# Patient Record
Sex: Male | Born: 1950 | Race: Black or African American | Hispanic: No | Marital: Married | Smoking: Former smoker
Health system: Southern US, Community
[De-identification: ages and names within clinical notes are randomized; demographics above are authoritative.]

## PROBLEM LIST (undated history)

## (undated) DIAGNOSIS — E7800 Pure hypercholesterolemia, unspecified: Secondary | ICD-10-CM

## (undated) DIAGNOSIS — I639 Cerebral infarction, unspecified: Secondary | ICD-10-CM

## (undated) DIAGNOSIS — E291 Testicular hypofunction: Secondary | ICD-10-CM

## (undated) DIAGNOSIS — I10 Essential (primary) hypertension: Secondary | ICD-10-CM

## (undated) DIAGNOSIS — I2510 Atherosclerotic heart disease of native coronary artery without angina pectoris: Secondary | ICD-10-CM

## (undated) DIAGNOSIS — R0609 Other forms of dyspnea: Secondary | ICD-10-CM

## (undated) DIAGNOSIS — Z7982 Long term (current) use of aspirin: Secondary | ICD-10-CM

## (undated) DIAGNOSIS — G4733 Obstructive sleep apnea (adult) (pediatric): Secondary | ICD-10-CM

## (undated) DIAGNOSIS — I6502 Occlusion and stenosis of left vertebral artery: Secondary | ICD-10-CM

## (undated) DIAGNOSIS — I517 Cardiomegaly: Secondary | ICD-10-CM

## (undated) DIAGNOSIS — M1990 Unspecified osteoarthritis, unspecified site: Secondary | ICD-10-CM

## (undated) DIAGNOSIS — N183 Chronic kidney disease, stage 3 unspecified: Secondary | ICD-10-CM

## (undated) DIAGNOSIS — M109 Gout, unspecified: Secondary | ICD-10-CM

## (undated) DIAGNOSIS — K219 Gastro-esophageal reflux disease without esophagitis: Secondary | ICD-10-CM

## (undated) DIAGNOSIS — M5030 Other cervical disc degeneration, unspecified cervical region: Secondary | ICD-10-CM

## (undated) DIAGNOSIS — I5020 Unspecified systolic (congestive) heart failure: Secondary | ICD-10-CM

## (undated) DIAGNOSIS — I2089 Other forms of angina pectoris: Secondary | ICD-10-CM

## (undated) DIAGNOSIS — N400 Enlarged prostate without lower urinary tract symptoms: Secondary | ICD-10-CM

## (undated) DIAGNOSIS — I429 Cardiomyopathy, unspecified: Secondary | ICD-10-CM

## (undated) DIAGNOSIS — E119 Type 2 diabetes mellitus without complications: Secondary | ICD-10-CM

## (undated) DIAGNOSIS — H269 Unspecified cataract: Secondary | ICD-10-CM

## (undated) DIAGNOSIS — R011 Cardiac murmur, unspecified: Secondary | ICD-10-CM

## (undated) DIAGNOSIS — M19041 Primary osteoarthritis, right hand: Secondary | ICD-10-CM

## (undated) DIAGNOSIS — N189 Chronic kidney disease, unspecified: Secondary | ICD-10-CM

## (undated) DIAGNOSIS — K449 Diaphragmatic hernia without obstruction or gangrene: Secondary | ICD-10-CM

## (undated) DIAGNOSIS — N281 Cyst of kidney, acquired: Secondary | ICD-10-CM

## (undated) DIAGNOSIS — G4730 Sleep apnea, unspecified: Secondary | ICD-10-CM

## (undated) DIAGNOSIS — D509 Iron deficiency anemia, unspecified: Secondary | ICD-10-CM

## (undated) HISTORY — DX: Essential (primary) hypertension: I10

## (undated) HISTORY — DX: Pure hypercholesterolemia, unspecified: E78.00

## (undated) HISTORY — PX: OTHER SURGICAL HISTORY: SHX169

## (undated) HISTORY — DX: Gout, unspecified: M10.9

## (undated) HISTORY — PX: TONSILLECTOMY: SUR1361

## (undated) HISTORY — PX: CARDIAC CATHETERIZATION: SHX172

## (undated) HISTORY — DX: Type 2 diabetes mellitus without complications: E11.9

## (undated) HISTORY — PX: COLONOSCOPY: SHX174

---

## 2004-11-30 ENCOUNTER — Ambulatory Visit: Payer: Self-pay | Admitting: Family Medicine

## 2007-02-07 ENCOUNTER — Other Ambulatory Visit: Payer: Self-pay

## 2007-02-07 ENCOUNTER — Emergency Department: Payer: Self-pay | Admitting: Unknown Physician Specialty

## 2007-09-16 ENCOUNTER — Emergency Department: Payer: Self-pay | Admitting: Emergency Medicine

## 2009-07-21 ENCOUNTER — Ambulatory Visit: Payer: Self-pay | Admitting: Family Medicine

## 2011-08-30 HISTORY — PX: CATARACT EXTRACTION W/ INTRAOCULAR LENS IMPLANT: SHX1309

## 2011-08-30 HISTORY — PX: EYE SURGERY: SHX253

## 2011-10-18 ENCOUNTER — Ambulatory Visit: Payer: Self-pay | Admitting: Ophthalmology

## 2011-10-18 LAB — POTASSIUM: Potassium: 3 mmol/L — ABNORMAL LOW (ref 3.5–5.1)

## 2011-10-31 ENCOUNTER — Ambulatory Visit: Payer: Self-pay | Admitting: Ophthalmology

## 2011-10-31 LAB — POTASSIUM: Potassium: 3.6 mmol/L (ref 3.5–5.1)

## 2012-06-29 ENCOUNTER — Ambulatory Visit: Payer: Self-pay | Admitting: Unknown Physician Specialty

## 2012-07-03 LAB — PATHOLOGY REPORT

## 2012-07-24 ENCOUNTER — Ambulatory Visit: Payer: Self-pay | Admitting: Internal Medicine

## 2014-02-13 ENCOUNTER — Ambulatory Visit: Payer: Self-pay | Admitting: Podiatry

## 2014-02-27 ENCOUNTER — Encounter: Payer: Self-pay | Admitting: Podiatry

## 2014-12-21 NOTE — Op Note (Signed)
PATIENT NAME:  Eric Browning, Eric Browning MR#:  [id number] DATE OF BIRTH:  November 08, 1950  DATE OF PROCEDURE:  10/31/2011  PREOPERATIVE DIAGNOSIS: Cataract, right eye.   POSTOPERATIVE DIAGNOSIS: Cataract, left eye.   PROCEDURE PERFORMED: Extracapsular cataract extraction using phacoemulsification with placement of an Alcon SN6CWS, 18.5-diopter posterior chamber lens, serial [id number].   SURGEON: Loura Back. Fedor Kazmierski, M.D.   ASSISTANT: None.   ANESTHESIA: 4% lidocaine and 0.75% Marcaine in a 50-50 mixture with 10 units/mL of Hylenex added, given as a peribulbar.   ANESTHESIOLOGIST: Dr. Benjamine Mola   COMPLICATIONS: None.   ESTIMATED BLOOD LOSS: Less than 1 mL.   DESCRIPTION OF PROCEDURE:  The patient was brought to the operating room and given IV sedation and a peribulbar block. He was then prepped and draped in the usual fashion. The vertical rectus muscles were imbricated using 5-0 silk sutures as bridle sutures. A limbal peritomy was carried out for one clock hour at 12:00. Hemostasis was obtained with cautery and a partial-thickness scleral groove was made at the posterior surgical limbus. This was dissected anteriorly into clear cornea with an Alcon crescent knife. The anterior chamber was entered superotemporally and superonasally through clear cornea with a paracentesis knife. Air was used to replace the aqueous and Vision Blue was then used to paint the anterior capsule. DisCoVisc was injected through the paracentesis track to replace the air and the anterior chamber was entered through the lamellar dissection with a 2.6-mm keratome. A continuous tear circular capsulorrhexis was carried out. Hydrodelineation was used to loosen the nucleus and phacoemulsification was carried out in a divide and conquer technique. Ultrasound time was one minute and 38 seconds with an average power of 24.4%. CDE 40.46. Irrigation-aspiration was used to remove the residual cortex. The capsular bag was inflated with DisCoVisc and  the lens was inserted in the capsular bag using an Acrysert delivery system. Irrigation-aspiration was used to remove the residual DisCoVisc and the wound was inflated with balanced salt. Miostat was injected through the paracentesis track to induce miosis and deepen the chamber. The wound was checked for leaks. None were found. The conjunctiva was closed with cautery and the bridle sutures were removed. Two drops of Vigamox were placed on the eye. A shield was placed on the eye. The patient was discharged to the recovery room in good condition.   ____________________________ Loura Back Pixie Burgener, MD sad:bjt D: 10/31/2011 13:20:33 ET T: 10/31/2011 14:16:34 ET JOB#: [id number]  cc: Remo Lipps A. Fabyan Loughmiller, MD, <Dictator> Martie Lee MD ELECTRONICALLY SIGNED 11/01/2011 14:19

## 2014-12-21 NOTE — Op Note (Signed)
PATIENT NAME:  Eric Browning, Eric Browning MR#:  [id number] DATE OF BIRTH:  1951/03/22  DATE OF PROCEDURE:  10/31/2011  PREOPERATIVE DIAGNOSIS:  Cataract, right eye.   POSTOPERATIVE DIAGNOSIS:  Cataract, right eye.  PROCEDURE PERFORMED:  Extracapsular cataract extraction using phacoemulsification with placement of an Alcon SN6CWS, 18.5-diopter posterior chamber lens, serial # [id number].  SURGEON:  Loura Back. Marcel Sorter, MD  ASSISTANT:  None.  ANESTHESIA:  4% lidocaine and 0.75% Marcaine in a 50/50 mixture with 10 units/mL of Hylenex added, given as a peribulbar.   ANESTHESIOLOGIST:  Dr. Benjamine Mola.  COMPLICATIONS:  None.  ESTIMATED BLOOD LOSS:  Less than 1 ml.  DESCRIPTION OF PROCEDURE:  The patient was brought to the operating room and given a peribulbar block.  The patient was then prepped and draped in the usual fashion.  The vertical rectus muscles were imbricated using 5-0 silk sutures.  These sutures were then clamped to the sterile drapes as bridle sutures.  A limbal peritomy was performed extending two clock hours and hemostasis was obtained with cautery.  A partial thickness scleral groove was made at the surgical limbus and dissected anteriorly in a lamellar dissection using an Alcon crescent knife.  The anterior chamber was entered superonasally with a Superblade and through the lamellar dissection with a 2.6 mm keratome.  DisCoVisc was used to replace the aqueous and a continuous tear capsulorrhexis was carried out.  Hydrodissection and hydrodelineation were carried out with balanced salt and a 27 gauge canula.  The nucleus was rotated to confirm the effectiveness of the hydrodissection.  Phacoemulsification was carried out using a divide-and-conquer technique.  Total ultrasound time was 1 minute and 38 seconds with an average power of 24.4 percent and CDE of 40.46.  Irrigation/aspiration was used to remove the residual cortex.  DisCoVisc was used to inflate the capsule and the internal incision  was enlarged to 3 mm with the crescent knife.  The intraocular lens was folded and inserted into the capsular bag using the AcrySert delivery system. Irrigation/aspiration was used to remove the residual DisCoVisc.  Miostat was injected into the anterior chamber through the paracentesis track to inflate the anterior chamber and induce miosis.  The wound was checked for leaks and none were found. The conjunctiva was closed with cautery and the bridle sutures were removed.  Two drops of 0.3% Vigamox were placed on the eye.   An eye shield was placed on the eye.  The patient was discharged to the recovery room in good condition.  ____________________________ Loura Back Mekhi Sonn, MD sad:slb D: 10/31/2011 13:13:32 ET T: 10/31/2011 13:52:36 ET JOB#: [id number]  cc: Remo Lipps A. Emer Onnen, MD, <Dictator> Martie Lee MD ELECTRONICALLY SIGNED 11/01/2011 14:19

## 2015-02-05 ENCOUNTER — Other Ambulatory Visit: Payer: Self-pay | Admitting: Orthopedic Surgery

## 2015-02-05 DIAGNOSIS — M25511 Pain in right shoulder: Secondary | ICD-10-CM

## 2015-02-05 DIAGNOSIS — S46001A Unspecified injury of muscle(s) and tendon(s) of the rotator cuff of right shoulder, initial encounter: Secondary | ICD-10-CM

## 2015-02-13 ENCOUNTER — Ambulatory Visit
Admission: RE | Admit: 2015-02-13 | Discharge: 2015-02-13 | Disposition: A | Discharge status: Home / Self Care | Payer: Managed Care, Other (non HMO) | Source: Ambulatory Visit | Attending: Orthopedic Surgery | Admitting: Orthopedic Surgery

## 2015-02-13 DIAGNOSIS — M62521 Muscle wasting and atrophy, not elsewhere classified, right upper arm: Secondary | ICD-10-CM | POA: Insufficient documentation

## 2015-02-13 DIAGNOSIS — M19011 Primary osteoarthritis, right shoulder: Secondary | ICD-10-CM | POA: Insufficient documentation

## 2015-02-13 DIAGNOSIS — S46811A Strain of other muscles, fascia and tendons at shoulder and upper arm level, right arm, initial encounter: Secondary | ICD-10-CM | POA: Diagnosis not present

## 2015-02-13 DIAGNOSIS — M25511 Pain in right shoulder: Secondary | ICD-10-CM

## 2015-02-13 DIAGNOSIS — R531 Weakness: Secondary | ICD-10-CM | POA: Diagnosis present

## 2015-02-13 DIAGNOSIS — M25811 Other specified joint disorders, right shoulder: Secondary | ICD-10-CM | POA: Diagnosis not present

## 2015-02-13 DIAGNOSIS — S46001A Unspecified injury of muscle(s) and tendon(s) of the rotator cuff of right shoulder, initial encounter: Secondary | ICD-10-CM

## 2015-06-18 DIAGNOSIS — Z8739 Personal history of other diseases of the musculoskeletal system and connective tissue: Secondary | ICD-10-CM | POA: Insufficient documentation

## 2015-08-03 ENCOUNTER — Ambulatory Visit: Payer: Managed Care, Other (non HMO) | Admitting: Anesthesiology

## 2015-08-03 ENCOUNTER — Encounter
Admission: RE | Disposition: A | Discharge status: Home / Self Care | Payer: Self-pay | Source: Ambulatory Visit | Attending: Unknown Physician Specialty

## 2015-08-03 ENCOUNTER — Encounter: Payer: Self-pay | Admitting: *Deleted

## 2015-08-03 ENCOUNTER — Ambulatory Visit
Admission: RE | Admit: 2015-08-03 | Discharge: 2015-08-03 | Disposition: A | Discharge status: Home / Self Care | Payer: Managed Care, Other (non HMO) | Source: Ambulatory Visit | Attending: Unknown Physician Specialty | Admitting: Unknown Physician Specialty

## 2015-08-03 DIAGNOSIS — Z79899 Other long term (current) drug therapy: Secondary | ICD-10-CM | POA: Diagnosis not present

## 2015-08-03 DIAGNOSIS — D122 Benign neoplasm of ascending colon: Secondary | ICD-10-CM | POA: Insufficient documentation

## 2015-08-03 DIAGNOSIS — K640 First degree hemorrhoids: Secondary | ICD-10-CM | POA: Insufficient documentation

## 2015-08-03 DIAGNOSIS — Z7982 Long term (current) use of aspirin: Secondary | ICD-10-CM | POA: Diagnosis not present

## 2015-08-03 DIAGNOSIS — Z8249 Family history of ischemic heart disease and other diseases of the circulatory system: Secondary | ICD-10-CM | POA: Insufficient documentation

## 2015-08-03 DIAGNOSIS — Z7984 Long term (current) use of oral hypoglycemic drugs: Secondary | ICD-10-CM | POA: Diagnosis not present

## 2015-08-03 DIAGNOSIS — D125 Benign neoplasm of sigmoid colon: Secondary | ICD-10-CM | POA: Diagnosis not present

## 2015-08-03 DIAGNOSIS — K635 Polyp of colon: Secondary | ICD-10-CM | POA: Insufficient documentation

## 2015-08-03 DIAGNOSIS — I10 Essential (primary) hypertension: Secondary | ICD-10-CM | POA: Diagnosis not present

## 2015-08-03 DIAGNOSIS — E7800 Pure hypercholesterolemia, unspecified: Secondary | ICD-10-CM | POA: Diagnosis not present

## 2015-08-03 DIAGNOSIS — E119 Type 2 diabetes mellitus without complications: Secondary | ICD-10-CM | POA: Diagnosis not present

## 2015-08-03 DIAGNOSIS — Z1211 Encounter for screening for malignant neoplasm of colon: Secondary | ICD-10-CM | POA: Diagnosis present

## 2015-08-03 DIAGNOSIS — Z87891 Personal history of nicotine dependence: Secondary | ICD-10-CM | POA: Insufficient documentation

## 2015-08-03 DIAGNOSIS — Z86010 Personal history of colonic polyps: Secondary | ICD-10-CM | POA: Insufficient documentation

## 2015-08-03 DIAGNOSIS — D123 Benign neoplasm of transverse colon: Secondary | ICD-10-CM | POA: Insufficient documentation

## 2015-08-03 HISTORY — PX: COLONOSCOPY WITH PROPOFOL: SHX5780

## 2015-08-03 LAB — GLUCOSE, CAPILLARY: GLUCOSE-CAPILLARY: 108 mg/dL — AB (ref 65–99)

## 2015-08-03 SURGERY — COLONOSCOPY WITH PROPOFOL
Anesthesia: General

## 2015-08-03 MED ORDER — SODIUM CHLORIDE 0.9 % IV SOLN: INTRAVENOUS | Status: DC

## 2015-08-03 MED ORDER — FENTANYL CITRATE (PF) 100 MCG/2ML IJ SOLN
INTRAMUSCULAR | Status: DC | PRN
  Administered 2015-08-03: 50 ug via INTRAVENOUS

## 2015-08-03 MED ORDER — LIDOCAINE HCL (PF) 2 % IJ SOLN
INTRAMUSCULAR | Status: DC | PRN
  Administered 2015-08-03: 50 mg

## 2015-08-03 MED ORDER — PROPOFOL 10 MG/ML IV BOLUS
INTRAVENOUS | Status: DC | PRN
  Administered 2015-08-03: 30 mg via INTRAVENOUS
  Administered 2015-08-03 (×2): 10 mg via INTRAVENOUS

## 2015-08-03 MED ORDER — MIDAZOLAM HCL 5 MG/5ML IJ SOLN
INTRAMUSCULAR | Status: DC | PRN
  Administered 2015-08-03: 1 mg via INTRAVENOUS

## 2015-08-03 MED ORDER — PROPOFOL 500 MG/50ML IV EMUL
INTRAVENOUS | Status: DC | PRN
  Administered 2015-08-03: 140 ug/kg/min via INTRAVENOUS

## 2015-08-03 MED ORDER — SODIUM CHLORIDE 0.9 % IV SOLN
INTRAVENOUS | Status: DC
  Administered 2015-08-03: 1000 mL via INTRAVENOUS

## 2015-08-03 NOTE — H&P (Signed)
   Primary Care Physician:  Dion Body, MD Primary Gastroenterologist:  Dr. Vira Agar  Pre-Procedure History & Physical: HPI:  Eric Browning is a 64 y.o. male is here for an colonoscopy.   Past Medical History  Diagnosis Date  . Hypertension   . High cholesterol   . Diabetes ([place])   . Gout     Past Surgical History  Procedure Laterality Date  . Colonoscopy    . Tonsillectomy      Prior to Admission medications   Medication Sig Start Date End Date Taking? Authorizing Provider  allopurinol (ZYLOPRIM) 300 MG tablet Take 300 mg by mouth daily.   Yes Historical Provider, MD  aspirin 81 MG tablet Take 81 mg by mouth daily.   Yes Historical Provider, MD  atorvastatin (LIPITOR) 20 MG tablet Take 20 mg by mouth daily.   Yes Historical Provider, MD  colchicine 0.6 MG tablet Take 0.6 mg by mouth daily.   Yes Historical Provider, MD  etodolac (LODINE) 200 MG capsule Take 200 mg by mouth every 8 (eight) hours.   Yes Historical Provider, MD  metoprolol succinate (TOPROL-XL) 25 MG 24 hr tablet Take 25 mg by mouth daily.   Yes Historical Provider, MD  Olmesartan-Amlodipine-HCTZ (TRIBENZOR) 40-10-25 MG TABS Take by mouth.   Yes Historical Provider, MD  potassium chloride SA (K-DUR,KLOR-CON) 20 MEQ tablet Take 20 mEq by mouth 2 (two) times daily.   Yes Historical Provider, MD  metFORMIN (GLUCOPHAGE) 500 MG tablet Take by mouth.    Historical Provider, MD  pravastatin (PRAVACHOL) 20 MG tablet Take 20 mg by mouth daily.    Historical Provider, MD    Allergies as of 06/25/2015  . (No Known Allergies)    Family History  Problem Relation Age of Onset  . Heart disease Mother   . Stroke Father   . Hypertension Father   . Gout Father     Social History   Social History  . Marital Status: Married    Spouse Name: N/A  . Number of Children: N/A  . Years of Education: N/A   Occupational History  . Not on file.   Social History Main Topics  . Smoking status: Former Research scientist (life sciences)  .  Smokeless tobacco: Not on file     Comment: quit 1990  . Alcohol Use: No  . Drug Use: No  . Sexual Activity: Not on file   Other Topics Concern  . Not on file   Social History Narrative    Review of Systems: See HPI, otherwise negative ROS  Physical Exam: BP 178/73 mmHg  Pulse 79  Temp(Src) 98.3 F (36.8 C) (Oral)  Resp 18  Ht 5\' 7"  (1.702 m)  Wt 99.791 kg (220 lb)  BMI 34.45 kg/m2  SpO2 100% General:   Alert,  pleasant and cooperative in NAD Head:  Normocephalic and atraumatic. Neck:  Supple; no masses or thyromegaly. Lungs:  Clear throughout to auscultation.    Heart:  Regular rate and rhythm. Abdomen:  Soft, nontender and nondistended. Normal bowel sounds, without guarding, and without rebound.   Neurologic:  Alert and  oriented x4;  grossly normal neurologically.  Impression/Plan: Eric Browning is here for an colonoscopy to be performed for [facility] colon polyps  Risks, benefits, limitations, and alternatives regarding  colonoscopy have been reviewed with the patient.  Questions have been answered.  All parties agreeable.   Gaylyn Cheers, MD  08/03/2015, 9:02 AM

## 2015-08-03 NOTE — Anesthesia Preprocedure Evaluation (Signed)
Anesthesia Evaluation  Patient identified by MRN, date of birth, ID band Patient awake    Reviewed: Allergy & Precautions, H&P , NPO status , Patient's Chart, lab work & pertinent test results, reviewed documented beta blocker date and time   History of Anesthesia Complications Negative for: history of anesthetic complications  Airway Mallampati: III  TM Distance: >3 FB Neck ROM: full    Dental no notable dental hx. (+) Edentulous Upper, Edentulous Lower, Upper Dentures, Lower Dentures   Pulmonary neg shortness of breath, neg sleep apnea, neg COPD, neg recent URI, former smoker,    Pulmonary exam normal breath sounds clear to auscultation       Cardiovascular Exercise Tolerance: Good hypertension, (-) angina(-) CAD, (-) Past MI, (-) Cardiac Stents and (-) CABG Normal cardiovascular exam(-) dysrhythmias + Valvular Problems/Murmurs  Rhythm:regular Rate:Normal     Neuro/Psych negative neurological ROS  negative psych ROS   GI/Hepatic negative GI ROS, Neg liver ROS,   Endo/Other  diabetes, Well Controlled, Oral Hypoglycemic Agents  Renal/GU negative Renal ROS  negative genitourinary   Musculoskeletal   Abdominal   Peds  Hematology negative hematology ROS (+)   Anesthesia Other Findings Past Medical History:   Hypertension                                                 High cholesterol                                             Diabetes (HCC)                                               Gout                                                         Reproductive/Obstetrics negative OB ROS                             Anesthesia Physical Anesthesia Plan  ASA: III  Anesthesia Plan: General   Post-op Pain Management:    Induction:   Airway Management Planned:   Additional Equipment:   Intra-op Plan:   Post-operative Plan:   Informed Consent: I have reviewed the patients History and  Physical, chart, labs and discussed the procedure including the risks, benefits and alternatives for the proposed anesthesia with the patient or authorized representative who has indicated his/her understanding and acceptance.   Dental Advisory Given  Plan Discussed with: Anesthesiologist, CRNA and Surgeon  Anesthesia Plan Comments:         Anesthesia Quick Evaluation

## 2015-08-03 NOTE — Transfer of Care (Signed)
Immediate Anesthesia Transfer of Care Note  Patient: Eric Browning  Procedure(s) Performed: Procedure(s): COLONOSCOPY WITH PROPOFOL (N/A)  Patient Location: PACU  Anesthesia Type:General  Level of Consciousness: sedated  Airway & Oxygen Therapy: Patient Spontanous Breathing and Patient connected to nasal cannula oxygen  Post-op Assessment: Report given to RN and Post -op Vital signs reviewed and stable  Post vital signs: Reviewed and stable  Last Vitals:  Filed Vitals:   08/03/15 0844  BP: 178/73  Pulse: 79  Temp: 36.8 C  Resp: 18    Complications: No apparent anesthesia complications

## 2015-08-03 NOTE — Op Note (Signed)
[facility] Patient Name: Eric Browning Procedure Date: 08/03/2015 8:49 AM MRN: [id number] Account #: [id number] Date of Birth: 22-Sep-1950 Admit Type: Outpatient Age: 64 Room: [facility] ENDO ROOM 1 Gender: Male Note Status: Finalized Procedure:         Colonoscopy Indications:       High risk colon cancer surveillance: Personal history of                     colonic polyps Providers:         Manya Silvas, MD Referring MD:      Dion Body (Referring MD) Medicines:         Propofol per Anesthesia Complications:     No immediate complications. Procedure:         Pre-Anesthesia Assessment:                    - After reviewing the risks and benefits, the patient was                     deemed in satisfactory condition to undergo the procedure.                    After obtaining informed consent, the colonoscope was                     passed under direct vision. Throughout the procedure, the                     patient's blood pressure, pulse, and oxygen saturations                     were monitored continuously. The Olympus PCF-H180AL                     colonoscope ( S#: [id number] ) was introduced through the                     anus and advanced to the the cecum, identified by                     appendiceal orifice and ileocecal valve. The colonoscopy                     was performed without difficulty. The patient tolerated                     the procedure well. The quality of the bowel preparation                     was good. Findings:      A small polyp was found in the ascending colon. The polyp was sessile.       The polyp was removed with a hot snare. Resection and retrieval were       complete. To prevent bleeding after the polypectomy, two hemostatic       clips were successfully placed. There was no bleeding during, and at the       end, of the procedure.      A diminutive polyp was found in the transverse colon. The polyp was      sessile. The polyp was removed with a jumbo cold forceps. Resection and       retrieval were complete.      A diminutive polyp was found  in the sigmoid colon. The polyp was       sessile. The polyp was removed with a jumbo cold forceps. Resection and       retrieval were complete.      A submucosal non-obstructing medium-sized mass was found in the rectum.       The mass was non-circumferential. The mass measured tw0-three cm in       length. No bleeding was present. Biopsies were taken with a cold forceps       for histology.      Internal hemorrhoids were found during endoscopy. The hemorrhoids were       small and Grade I (internal hemorrhoids that do not prolapse). Impression:        - One small polyp in the ascending colon. Resected and                     retrieved. Clips were placed.                    - One diminutive polyp in the transverse colon. Resected                     and retrieved.                    - One diminutive polyp in the sigmoid colon. Resected and                     retrieved.                    - Likely benign tumor in the rectum. Removal was not done.                     Biopsied.                    - Internal hemorrhoids. Recommendation:    - Await pathology results. Manya Silvas, MD 08/03/2015 9:46:39 AM This report has been signed electronically. Number of Addenda: 0 Note Initiated On: 08/03/2015 8:49 AM Total Procedure Duration: 0 hours 25 minutes 30 seconds       [name]

## 2015-08-03 NOTE — Anesthesia Postprocedure Evaluation (Signed)
Anesthesia Post Note  Patient: Eric Browning  Procedure(s) Performed: Procedure(s) (LRB): COLONOSCOPY WITH PROPOFOL (N/A)  Patient location during evaluation: PACU Anesthesia Type: General Level of consciousness: awake and alert Pain management: pain level controlled Vital Signs Assessment: post-procedure vital signs reviewed and stable Respiratory status: spontaneous breathing, nonlabored ventilation, respiratory function stable and patient connected to nasal cannula oxygen Cardiovascular status: blood pressure returned to baseline and stable Postop Assessment: no signs of nausea or vomiting Anesthetic complications: no    Last Vitals:  Filed Vitals:   08/03/15 1028 08/03/15 1038  BP: 160/84 170/88  Pulse: 63 64  Temp:    Resp: 15 15    Last Pain:  Filed Vitals:   08/03/15 1038  PainSc: Asleep                 Martha Clan

## 2015-08-05 LAB — SURGICAL PATHOLOGY

## 2015-08-10 ENCOUNTER — Telehealth: Payer: Self-pay

## 2015-08-10 NOTE — Telephone Encounter (Signed)
  Oncology Nurse Navigator Documentation  Referral date to RadOnc/MedOnc: 08/07/15 (08/10/15 1600) Navigator Encounter Type: Telephone (08/10/15 1600)         Interventions: Coordination of Care (08/10/15 1600)   Coordination of Care: EUS (08/10/15 1600)        Time Spent with Patient: 30 (08/10/15 1600)   Referral received for lower EUS. Available date of 08/13/15 offered to patient. He stated that his spouse was having surgery on this Tuesday and he would not have a ride. He asked if he could schedule it in 2 weeks when [facility] physicians are back. Next available date is 09/03/15. This date has been arranged. Went over instructions for lower EUS as well as mailed him a copy. Caryl Pina at Dr [facility] office notified of procedure delay due to patient request.Miralax-Gatorade prep sent to patient. INSTRUCTIONS FOR ENDOSCOPIC ULTRASOUND -Your procedure has been scheduled for January 5th with Dr Earlie Counts -The hospital will contact you to pre-register over the phone. If for any reason you have not received a call within one week prior to your scheduled procedure date, please call [phone]. -To get your scheduled arrival time, please call the Endoscopy unit at  [phone] between 1-3pm on: January 4th -Begin your bowel prep at 12:00PM on January 4th . Instructions for Miralax-Gatorade Bowel Preparation are enclosed. -ON THE DAY OF YOU PROCEDURE:  1. You may take your heart, seizure, blood pressure, Parkinson's or breathing medications at  6am with just enough water to get your pills down  2. Do not take any oral Diabetic medications the morning of your procedure.  3. Do not take Vitamins  -On the day of your procedure, come to the [facility] Admitting/Registration desk (First desk on the right) at the scheduled arrival time. -You will need a responsible person to drive you home.

## 2015-08-11 NOTE — Telephone Encounter (Signed)
EUS will be with Dr Mont Dutton

## 2015-08-30 HISTORY — PX: JOINT REPLACEMENT: SHX530

## 2015-09-03 ENCOUNTER — Ambulatory Visit: Payer: Managed Care, Other (non HMO) | Admitting: Anesthesiology

## 2015-09-03 ENCOUNTER — Ambulatory Visit
Admission: RE | Admit: 2015-09-03 | Discharge: 2015-09-03 | Disposition: A | Discharge status: Home / Self Care | Payer: Managed Care, Other (non HMO) | Source: Ambulatory Visit | Attending: Internal Medicine | Admitting: Internal Medicine

## 2015-09-03 ENCOUNTER — Encounter
Admission: RE | Disposition: A | Discharge status: Home / Self Care | Payer: Self-pay | Source: Ambulatory Visit | Attending: Internal Medicine

## 2015-09-03 ENCOUNTER — Encounter: Payer: Self-pay | Admitting: *Deleted

## 2015-09-03 DIAGNOSIS — M109 Gout, unspecified: Secondary | ICD-10-CM | POA: Diagnosis not present

## 2015-09-03 DIAGNOSIS — Z7984 Long term (current) use of oral hypoglycemic drugs: Secondary | ICD-10-CM | POA: Diagnosis not present

## 2015-09-03 DIAGNOSIS — Z8249 Family history of ischemic heart disease and other diseases of the circulatory system: Secondary | ICD-10-CM | POA: Diagnosis not present

## 2015-09-03 DIAGNOSIS — K639 Disease of intestine, unspecified: Secondary | ICD-10-CM | POA: Diagnosis not present

## 2015-09-03 DIAGNOSIS — Z8489 Family history of other specified conditions: Secondary | ICD-10-CM | POA: Diagnosis not present

## 2015-09-03 DIAGNOSIS — Z87891 Personal history of nicotine dependence: Secondary | ICD-10-CM | POA: Diagnosis not present

## 2015-09-03 DIAGNOSIS — E119 Type 2 diabetes mellitus without complications: Secondary | ICD-10-CM | POA: Insufficient documentation

## 2015-09-03 DIAGNOSIS — E7800 Pure hypercholesterolemia, unspecified: Secondary | ICD-10-CM | POA: Diagnosis not present

## 2015-09-03 DIAGNOSIS — K598 Other specified functional intestinal disorders: Secondary | ICD-10-CM | POA: Diagnosis present

## 2015-09-03 DIAGNOSIS — Z7982 Long term (current) use of aspirin: Secondary | ICD-10-CM | POA: Diagnosis not present

## 2015-09-03 DIAGNOSIS — D1779 Benign lipomatous neoplasm of other sites: Secondary | ICD-10-CM | POA: Insufficient documentation

## 2015-09-03 DIAGNOSIS — I10 Essential (primary) hypertension: Secondary | ICD-10-CM | POA: Insufficient documentation

## 2015-09-03 DIAGNOSIS — Z823 Family history of stroke: Secondary | ICD-10-CM | POA: Insufficient documentation

## 2015-09-03 HISTORY — PX: EUS: SHX5427

## 2015-09-03 LAB — GLUCOSE, CAPILLARY: Glucose-Capillary: 78 mg/dL (ref 65–99)

## 2015-09-03 SURGERY — ULTRASOUND, LOWER GI TRACT, ENDOSCOPIC
Anesthesia: General

## 2015-09-03 MED ORDER — LIDOCAINE HCL (PF) 1 % IJ SOLN
2.0000 mL | Freq: Once | INTRAMUSCULAR | Status: AC
  Administered 2015-09-03: 0.03 mL via INTRADERMAL

## 2015-09-03 MED ORDER — SODIUM CHLORIDE 0.9 % IV SOLN
INTRAVENOUS | Status: DC
  Administered 2015-09-03: 1000 mL via INTRAVENOUS
  Administered 2015-09-03: 13:00:00 via INTRAVENOUS

## 2015-09-03 MED ORDER — PROPOFOL 500 MG/50ML IV EMUL
INTRAVENOUS | Status: DC | PRN
  Administered 2015-09-03: 150 ug/kg/min via INTRAVENOUS

## 2015-09-03 MED ORDER — LIDOCAINE HCL (PF) 1 % IJ SOLN
INTRAMUSCULAR | Status: AC
  Administered 2015-09-03: 0.03 mL via INTRADERMAL
  Filled 2015-09-03: qty 2

## 2015-09-03 MED ORDER — PROPOFOL 10 MG/ML IV BOLUS
INTRAVENOUS | Status: DC | PRN
  Administered 2015-09-03: 70 mg via INTRAVENOUS

## 2015-09-03 MED ORDER — MIDAZOLAM HCL 2 MG/2ML IJ SOLN
INTRAMUSCULAR | Status: DC | PRN
  Administered 2015-09-03: 1 mg via INTRAVENOUS

## 2015-09-03 MED ORDER — FENTANYL CITRATE (PF) 100 MCG/2ML IJ SOLN
INTRAMUSCULAR | Status: DC | PRN
  Administered 2015-09-03: 50 ug via INTRAVENOUS

## 2015-09-03 NOTE — H&P (Signed)
Kennth Demory is an 65 y.o. male.   Chief Complaint: Rectal mass for rectal EUS evaluation HPI:  Incidentally found rectal submucosal lesion on recent routine colonoscopy.  Past Medical History  Diagnosis Date  . Hypertension   . High cholesterol   . Diabetes ([place])   . Gout     Past Surgical History  Procedure Laterality Date  . Colonoscopy    . Tonsillectomy    . Colonoscopy with propofol N/A 08/03/2015    Procedure: COLONOSCOPY WITH PROPOFOL;  Surgeon: Manya Silvas, MD;  Location: [facility] ENDOSCOPY;  Service: Endoscopy;  Laterality: N/A;  . Right eye lens implant      Family History  Problem Relation Age of Onset  . Heart disease Mother   . Stroke Father   . Hypertension Father   . Gout Father    Social History:  reports that he has quit smoking. He does not have any smokeless tobacco history on file. He reports that he does not drink alcohol or use illicit drugs.  Allergies: No Known Allergies  Medications Prior to Admission  Medication Sig Dispense Refill  . allopurinol (ZYLOPRIM) 300 MG tablet Take 300 mg by mouth daily.    Marland Kitchen aspirin 81 MG tablet Take 81 mg by mouth daily.    Marland Kitchen atorvastatin (LIPITOR) 20 MG tablet Take 20 mg by mouth daily.    . colchicine 0.6 MG tablet Take 0.6 mg by mouth daily.    Marland Kitchen etodolac (LODINE) 200 MG capsule Take 200 mg by mouth every 8 (eight) hours.    . metFORMIN (GLUCOPHAGE) 500 MG tablet Take by mouth.    . metoprolol succinate (TOPROL-XL) 25 MG 24 hr tablet Take 25 mg by mouth daily.    . Olmesartan-Amlodipine-HCTZ (TRIBENZOR) 40-10-25 MG TABS Take by mouth.    . potassium chloride SA (K-DUR,KLOR-CON) 20 MEQ tablet Take 20 mEq by mouth 2 (two) times daily.    . pravastatin (PRAVACHOL) 20 MG tablet Take 20 mg by mouth daily.      Results for orders placed or performed during the hospital encounter of 09/03/15 (from the past 48 hour(s))  Glucose, capillary     Status: None   Collection Time: 09/03/15 12:36 PM  Result Value Ref Range   Glucose-Capillary 78 65 - 99 mg/dL   No results found.  ROS  Blood pressure 184/69, pulse 78, temperature 97.9 F (36.6 C), temperature source Tympanic, resp. rate 18, height 5\' 6"  (1.676 m), weight 98.884 kg (218 lb). Physical Exam  Abd: Soft, NT, ND, + BS Chest: CTAB Heart: RRR, no murmurs  Assessment/Plan Rectal EUS  Tillie Rung 09/03/2015, 1:07 PM

## 2015-09-03 NOTE — Op Note (Signed)
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 Providers:         Murray Hodgkins. Corrie Reder Referring MD:      Dion Body (Referring MD), Manya Silvas, MD                     (Referring MD) Medicines:         Propofol per Anesthesia Complications:     No immediate complications. Procedure:         Pre-Anesthesia Assessment:                    Prior to the procedure, a History and Physical was                     performed, and patient medications and allergies were                     reviewed. The patient is competent. The risks and benefits                     of the procedure and the sedation options and risks were                     discussed with the patient. All questions were answered                     and informed consent was obtained. Patient identification                     and proposed procedure were verified by the physician, the                     nurse and the anesthetist in the pre-procedure area.                     Mental Status Examination: alert and oriented. Airway                     Examination: normal oropharyngeal airway and neck                     mobility. Respiratory Examination: clear to auscultation.                     CV Examination: normal. Prophylactic Antibiotics: The                     patient does not require prophylactic antibiotics. Prior                     Anticoagulants: The patient has taken no previous  anticoagulant or antiplatelet  agents. ASA Grade                     Assessment: II - A patient with mild systemic disease.                     After reviewing the risks and benefits, the patient was                     deemed in satisfactory condition to undergo the procedure.                     The anesthesia plan was to use monitored anesthesia care                     (MAC). Immediately prior to administration of medications,                     the patient was re-assessed for adequacy to receive                     sedatives. The heart rate, respiratory rate, oxygen                     saturations, blood pressure, adequacy of pulmonary                     ventilation, and response to care were monitored                     throughout the procedure. The physical status of the                     patient was re-assessed after the procedure.                    After obtaining informed consent, the endoscope was passed                     under direct vision. Throughout the procedure, the                     patient's blood pressure, pulse, and oxygen saturations                     were monitored continuously. The Endoscope was introduced                     through the anus and advanced to the sigmoid colon. The                     EUS GI Radial Array [id number] was introduced through the                     anus and advanced to the sigmoid colon for ultrasound. The                     lower EUS was accomplished without difficulty. The patient                     tolerated the procedure well. The quality of the bowel                     preparation was good. Findings:      The perianal and digital rectal examinations  were normal.      Endoscopic Finding :      One medium sized submucosal nodule was found in the distal rectum.      The proximal rectum, recto-sigmoid colon and sigmoid colon appeared       normal.      Endosonographic Finding :      An oval intramural (subepithelial) lesion was found in the distal        rectum. The lesion was encountered at 3 cm (from the anal verge). The       lesion was hyperechoic. Sonographically, the origin appeared to be       within the submucosa (Layer 3) and measured 12.9 mm X 8.8 mm in greatest       cross-sectional dimensions. The endosonographic borders were       well-defined.      The perirectal space was normal.      No lymph nodes were seen in the perirectal region. Impression:        Flexible Sigmoidoscopy Impressions:                    - Submucosal nodule in the distal rectum.                    - The proximal rectum, recto-sigmoid colon and sigmoid                     colon are normal.                    Rectal EUS Impressions:                    - An intramural (subepithelial) lesion was visualized                     endosonographically in the rectum. The origin of the                     lesion appeared to be within the submucosa (Layer 3).                     Tissue has not been obtained. 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                    - Endosonographic images of the perirectal space were                     unremarkable.                    - No lymph nodes were seen in the perirectal region during                     endosonographic examination.                    - No specimens collected. Recommendation:    - Discharge patient to home (ambulatory).                    - No further work-up is necessary for the rectal nodule                     given the appearance  consistent with a lipoma. Resume                     routine colon cancer screening at previously recommended                     interval.                    - The findings and recommendations were discussed with the                     patient and his family.                    - Return to referring physician as previously scheduled. Procedure Code(s): --- Professional ---                    [phone], Sigmoidoscopy,  flexible; with endoscopic ultrasound                     examination Diagnosis Code(s): --- Professional ---                    K62.89, Other specified diseases of anus and rectum CPT copyright 2014 American Medical Association. All rights reserved. The codes documented in this report are preliminary and upon coder review may  be revised to meet current compliance requirements. Attending Participation:      I personally performed the entire procedure without the assistance of a       fellow, resident or surgical assistant. Kincaid,  09/03/2015 1:32:24 PM This report has been signed electronically. Number of Addenda: 0 Note Initiated On: 09/03/2015 12:44 PM Total Procedure Duration: 0 hours 8 minutes 30 seconds       [facility]

## 2015-09-03 NOTE — Anesthesia Preprocedure Evaluation (Signed)
Anesthesia Evaluation  Patient identified by MRN, date of birth, ID band Patient awake    Reviewed: Allergy & Precautions, H&P , NPO status , Patient's Chart, lab work & pertinent test results, reviewed documented beta blocker date and time   History of Anesthesia Complications Negative for: history of anesthetic complications  Airway Mallampati: III  TM Distance: >3 FB Neck ROM: full    Dental no notable dental hx. (+) Upper Dentures, Lower Dentures, Edentulous Upper, Edentulous Lower   Pulmonary neg shortness of breath, neg sleep apnea, neg COPD, neg recent URI, former smoker,    Pulmonary exam normal breath sounds clear to auscultation       Cardiovascular Exercise Tolerance: Good hypertension, (-) angina(-) CAD, (-) Past MI, (-) Cardiac Stents and (-) CABG Normal cardiovascular exam(-) dysrhythmias + Valvular Problems/Murmurs  Rhythm:regular Rate:Normal     Neuro/Psych negative neurological ROS  negative psych ROS   GI/Hepatic negative GI ROS, Neg liver ROS,   Endo/Other  diabetes, Well Controlled, Oral Hypoglycemic Agents  Renal/GU negative Renal ROS  negative genitourinary   Musculoskeletal   Abdominal   Peds  Hematology negative hematology ROS (+)   Anesthesia Other Findings Past Medical History:   Hypertension                                                 High cholesterol                                             Diabetes (HCC)                                               Gout                                                         Reproductive/Obstetrics negative OB ROS                             Anesthesia Physical Anesthesia Plan  ASA: III  Anesthesia Plan: General   Post-op Pain Management:    Induction:   Airway Management Planned:   Additional Equipment:   Intra-op Plan:   Post-operative Plan:   Informed Consent: I have reviewed the patients History and  Physical, chart, labs and discussed the procedure including the risks, benefits and alternatives for the proposed anesthesia with the patient or authorized representative who has indicated his/her understanding and acceptance.   Dental Advisory Given  Plan Discussed with: Anesthesiologist, CRNA and Surgeon  Anesthesia Plan Comments:         Anesthesia Quick Evaluation

## 2015-09-03 NOTE — Transfer of Care (Signed)
Immediate Anesthesia Transfer of Care Note  Patient: Eric Browning  Procedure(s) Performed: Procedure(s): LOWER ENDOSCOPIC ULTRASOUND (EUS) (N/A)  Patient Location: PACU  Anesthesia Type:General  Level of Consciousness: sedated  Airway & Oxygen Therapy: Patient Spontanous Breathing and Patient connected to nasal cannula oxygen  Post-op Assessment: Report given to RN and Post -op Vital signs reviewed and stable  Post vital signs: Reviewed and stable  Last Vitals:  Filed Vitals:   09/03/15 1239  BP: 184/69  Pulse: 78  Temp: 36.6 C  Resp: 18    Complications: No apparent anesthesia complications

## 2015-09-04 NOTE — Anesthesia Postprocedure Evaluation (Signed)
Anesthesia Post Note  Patient: Eric Browning  Procedure(s) Performed: Procedure(s) (LRB): LOWER ENDOSCOPIC ULTRASOUND (EUS) (N/A)  Patient location during evaluation: Endoscopy Anesthesia Type: General Level of consciousness: awake and alert Pain management: pain level controlled Vital Signs Assessment: post-procedure vital signs reviewed and stable Respiratory status: spontaneous breathing, nonlabored ventilation, respiratory function stable and patient connected to nasal cannula oxygen Cardiovascular status: blood pressure returned to baseline and stable Postop Assessment: no signs of nausea or vomiting Anesthetic complications: no    Last Vitals:  Filed Vitals:   09/03/15 1350 09/03/15 1400  BP: 135/77 152/106  Pulse: 73 70  Temp:    Resp: 15 16    Last Pain:  Filed Vitals:   09/04/15 0815  PainSc: 0-No pain                 Martha Clan

## 2016-03-29 ENCOUNTER — Other Ambulatory Visit: Payer: Self-pay | Admitting: Surgery

## 2016-03-29 DIAGNOSIS — M1711 Unilateral primary osteoarthritis, right knee: Secondary | ICD-10-CM

## 2016-03-29 DIAGNOSIS — S83271A Complex tear of lateral meniscus, current injury, right knee, initial encounter: Secondary | ICD-10-CM

## 2016-04-14 ENCOUNTER — Ambulatory Visit
Admission: RE | Admit: 2016-04-14 | Discharge: 2016-04-14 | Disposition: A | Discharge status: Home / Self Care | Payer: Managed Care, Other (non HMO) | Source: Ambulatory Visit | Attending: Surgery | Admitting: Surgery

## 2016-04-14 DIAGNOSIS — M659 Synovitis and tenosynovitis, unspecified: Secondary | ICD-10-CM | POA: Insufficient documentation

## 2016-04-14 DIAGNOSIS — S83421A Sprain of lateral collateral ligament of right knee, initial encounter: Secondary | ICD-10-CM | POA: Diagnosis not present

## 2016-04-14 DIAGNOSIS — S83511A Sprain of anterior cruciate ligament of right knee, initial encounter: Secondary | ICD-10-CM | POA: Diagnosis not present

## 2016-04-14 DIAGNOSIS — X58XXXA Exposure to other specified factors, initial encounter: Secondary | ICD-10-CM | POA: Insufficient documentation

## 2016-04-14 DIAGNOSIS — S83271A Complex tear of lateral meniscus, current injury, right knee, initial encounter: Secondary | ICD-10-CM | POA: Diagnosis present

## 2016-04-14 DIAGNOSIS — M7121 Synovial cyst of popliteal space [Baker], right knee: Secondary | ICD-10-CM | POA: Diagnosis not present

## 2016-04-14 DIAGNOSIS — M25461 Effusion, right knee: Secondary | ICD-10-CM | POA: Diagnosis not present

## 2016-04-14 DIAGNOSIS — M1711 Unilateral primary osteoarthritis, right knee: Secondary | ICD-10-CM | POA: Insufficient documentation

## 2016-05-04 ENCOUNTER — Encounter
Admission: RE | Admit: 2016-05-04 | Discharge: 2016-05-04 | Disposition: A | Discharge status: Home / Self Care | Payer: Managed Care, Other (non HMO) | Source: Ambulatory Visit | Attending: Surgery | Admitting: Surgery

## 2016-05-04 ENCOUNTER — Ambulatory Visit
Admission: RE | Admit: 2016-05-04 | Discharge: 2016-05-04 | Disposition: A | Discharge status: Home / Self Care | Payer: Managed Care, Other (non HMO) | Source: Ambulatory Visit | Attending: Surgery | Admitting: Surgery

## 2016-05-04 DIAGNOSIS — Z01810 Encounter for preprocedural cardiovascular examination: Secondary | ICD-10-CM | POA: Insufficient documentation

## 2016-05-04 DIAGNOSIS — S83271D Complex tear of lateral meniscus, current injury, right knee, subsequent encounter: Secondary | ICD-10-CM | POA: Diagnosis not present

## 2016-05-04 DIAGNOSIS — M1711 Unilateral primary osteoarthritis, right knee: Secondary | ICD-10-CM | POA: Diagnosis not present

## 2016-05-04 DIAGNOSIS — Z87891 Personal history of nicotine dependence: Secondary | ICD-10-CM

## 2016-05-04 DIAGNOSIS — Z01812 Encounter for preprocedural laboratory examination: Secondary | ICD-10-CM | POA: Diagnosis present

## 2016-05-04 DIAGNOSIS — I517 Cardiomegaly: Secondary | ICD-10-CM | POA: Diagnosis not present

## 2016-05-04 DIAGNOSIS — X58XXXD Exposure to other specified factors, subsequent encounter: Secondary | ICD-10-CM | POA: Insufficient documentation

## 2016-05-04 LAB — URINALYSIS COMPLETE WITH MICROSCOPIC (ARMC ONLY)
BILIRUBIN URINE: NEGATIVE
Bacteria, UA: NONE SEEN
Glucose, UA: NEGATIVE mg/dL
Hgb urine dipstick: NEGATIVE
Ketones, ur: NEGATIVE mg/dL
Leukocytes, UA: NEGATIVE
Nitrite: NEGATIVE
PH: 5 (ref 5.0–8.0)
PROTEIN: 30 mg/dL — AB
RBC / HPF: NONE SEEN RBC/hpf (ref 0–5)
Specific Gravity, Urine: 1.019 (ref 1.005–1.030)

## 2016-05-04 LAB — BASIC METABOLIC PANEL
Anion gap: 5 (ref 5–15)
BUN: 24 mg/dL — ABNORMAL HIGH (ref 6–20)
CALCIUM: 10.4 mg/dL — AB (ref 8.9–10.3)
CO2: 32 mmol/L (ref 22–32)
Chloride: 103 mmol/L (ref 101–111)
Creatinine, Ser: 0.95 mg/dL (ref 0.61–1.24)
Glucose, Bld: 86 mg/dL (ref 65–99)
Potassium: 3.1 mmol/L — ABNORMAL LOW (ref 3.5–5.1)
Sodium: 140 mmol/L (ref 135–145)

## 2016-05-04 LAB — CBC
HCT: 36.5 % — ABNORMAL LOW (ref 40.0–52.0)
Hemoglobin: 12.2 g/dL — ABNORMAL LOW (ref 13.0–18.0)
MCH: 28.8 pg (ref 26.0–34.0)
MCHC: 33.3 g/dL (ref 32.0–36.0)
MCV: 86.5 fL (ref 80.0–100.0)
PLATELETS: 380 10*3/uL (ref 150–440)
RBC: 4.23 MIL/uL — ABNORMAL LOW (ref 4.40–5.90)
RDW: 14.9 % — AB (ref 11.5–14.5)
WBC: 8.2 10*3/uL (ref 3.8–10.6)

## 2016-05-04 LAB — PROTIME-INR
INR: 1.01
Prothrombin Time: 13.3 seconds (ref 11.4–15.2)

## 2016-05-04 LAB — SURGICAL PCR SCREEN
MRSA, PCR: NEGATIVE
STAPHYLOCOCCUS AUREUS: NEGATIVE

## 2016-05-04 LAB — TYPE AND SCREEN
ABO/RH(D): B POS
ANTIBODY SCREEN: NEGATIVE

## 2016-05-04 NOTE — Patient Instructions (Signed)
Your procedure is scheduled on: Thursday 05/12/16 Report to Day Surgery. 2ND FLOOR MEDICAL MALL ENTRANCE To find out your arrival time please call [phone] between 1PM - 3PM on Wednesday 05/11/16.  Remember: Instructions that are not followed completely may result in serious medical risk, up to and including death, or upon the discretion of your surgeon and anesthesiologist your surgery may need to be rescheduled.    __X__ 1. Do not eat food or drink liquids after midnight. No gum chewing or hard candies.     __X__ 2. No Alcohol for 24 hours before or after surgery.   ____ 3. Bring all medications with you on the day of surgery if instructed.    __X__ 4. Notify your doctor if there is any change in your medical condition     (cold, fever, infections).     Do not wear jewelry, make-up, hairpins, clips or nail polish.  Do not wear lotions, powders, or perfumes.   Do not shave 48 hours prior to surgery. Men may shave face and neck.  Do not bring valuables to the hospital.    [facility] is not responsible for any belongings or valuables.               Contacts, dentures or bridgework may not be worn into surgery.  Leave your suitcase in the car. After surgery it may be brought to your room.  For patients admitted to the hospital, discharge time is determined by your                treatment team.   Patients discharged the day of surgery will not be allowed to drive home.   Please read over the following fact sheets that you were given:   MRSA Information and Surgical Site Infection Prevention   __X__ Take these medicines the morning of surgery with A SIP OF WATER:    1. ATORVASTATIN  2. METOPROLOL  3. PRAVASTATIN  4.  5.  6.  ____ Fleet Enema (as directed)   __X__ Use CHG Soap as directed  ____ Use inhalers on the day of surgery  __X__ Stop metformin 2 days prior to surgery    ____ Take 1/2 of usual insulin dose the night before surgery and none on the morning of  surgery.   __X__ Stop Coumadin/Plavix/aspirin on 05/05/16  __X__ Stop Anti-inflammatories on 05/05/16 ETODOLAC/LODINE   ____ Stop supplements until after surgery.    ____ Bring C-Pap to the hospital.

## 2016-05-04 NOTE — Pre-Procedure Instructions (Signed)
Abnormal EKG, request for clearance faxed to Dr Netty Starring and Dr Roland Rack

## 2016-05-05 NOTE — Pre-Procedure Instructions (Signed)
Faxed a request to Dr. Nicholaus Bloom office for adjustment in Alva dose due to K 3.1 in PAT.  Will recheck AM of surgery.

## 2016-05-12 ENCOUNTER — Inpatient Hospital Stay: Payer: Managed Care, Other (non HMO)

## 2016-05-12 ENCOUNTER — Encounter: Payer: Self-pay | Admitting: *Deleted

## 2016-05-12 ENCOUNTER — Inpatient Hospital Stay: Payer: Managed Care, Other (non HMO) | Admitting: Anesthesiology

## 2016-05-12 ENCOUNTER — Encounter
Admission: RE | Disposition: A | Discharge status: Home Health | Payer: Self-pay | Source: Ambulatory Visit | Attending: Surgery

## 2016-05-12 ENCOUNTER — Inpatient Hospital Stay
Admission: RE | Admit: 2016-05-12 | Discharge: 2016-05-14 | DRG: 470 | Disposition: A | Discharge status: Home Health | Payer: Managed Care, Other (non HMO) | Source: Ambulatory Visit | Attending: Surgery | Admitting: Surgery

## 2016-05-12 DIAGNOSIS — E669 Obesity, unspecified: Secondary | ICD-10-CM | POA: Diagnosis present

## 2016-05-12 DIAGNOSIS — M1711 Unilateral primary osteoarthritis, right knee: Principal | ICD-10-CM | POA: Diagnosis present

## 2016-05-12 DIAGNOSIS — E7800 Pure hypercholesterolemia, unspecified: Secondary | ICD-10-CM | POA: Diagnosis present

## 2016-05-12 DIAGNOSIS — M23200 Derangement of unspecified lateral meniscus due to old tear or injury, right knee: Secondary | ICD-10-CM | POA: Diagnosis present

## 2016-05-12 DIAGNOSIS — I10 Essential (primary) hypertension: Secondary | ICD-10-CM | POA: Diagnosis present

## 2016-05-12 DIAGNOSIS — M109 Gout, unspecified: Secondary | ICD-10-CM | POA: Diagnosis present

## 2016-05-12 DIAGNOSIS — Z7982 Long term (current) use of aspirin: Secondary | ICD-10-CM | POA: Diagnosis not present

## 2016-05-12 DIAGNOSIS — Z96651 Presence of right artificial knee joint: Secondary | ICD-10-CM

## 2016-05-12 DIAGNOSIS — Z6833 Body mass index (BMI) 33.0-33.9, adult: Secondary | ICD-10-CM

## 2016-05-12 DIAGNOSIS — Z7984 Long term (current) use of oral hypoglycemic drugs: Secondary | ICD-10-CM | POA: Diagnosis not present

## 2016-05-12 DIAGNOSIS — Z79899 Other long term (current) drug therapy: Secondary | ICD-10-CM | POA: Diagnosis not present

## 2016-05-12 DIAGNOSIS — E119 Type 2 diabetes mellitus without complications: Secondary | ICD-10-CM | POA: Diagnosis present

## 2016-05-12 HISTORY — PX: TOTAL KNEE ARTHROPLASTY: SHX125

## 2016-05-12 LAB — POCT I-STAT 4, (NA,K, GLUC, HGB,HCT)
Glucose, Bld: 121 mg/dL — ABNORMAL HIGH (ref 65–99)
HEMATOCRIT: 34 % — AB (ref 39.0–52.0)
HEMOGLOBIN: 11.6 g/dL — AB (ref 13.0–17.0)
Potassium: 3.5 mmol/L (ref 3.5–5.1)
Sodium: 143 mmol/L (ref 135–145)

## 2016-05-12 LAB — GLUCOSE, CAPILLARY
GLUCOSE-CAPILLARY: 155 mg/dL — AB (ref 65–99)
Glucose-Capillary: 108 mg/dL — ABNORMAL HIGH (ref 65–99)
Glucose-Capillary: 98 mg/dL (ref 65–99)

## 2016-05-12 SURGERY — ARTHROPLASTY, KNEE, TOTAL
Anesthesia: Spinal | Site: Knee | Laterality: Right | Wound class: Clean

## 2016-05-12 MED ORDER — PROMETHAZINE HCL 25 MG/ML IJ SOLN: 6.2500 mg | INTRAMUSCULAR | Status: DC | PRN

## 2016-05-12 MED ORDER — ACETAMINOPHEN 325 MG PO TABS: 650.0000 mg | ORAL_TABLET | Freq: Four times a day (QID) | ORAL | Status: DC | PRN

## 2016-05-12 MED ORDER — ACETAMINOPHEN 650 MG RE SUPP: 650.0000 mg | Freq: Four times a day (QID) | RECTAL | Status: DC | PRN

## 2016-05-12 MED ORDER — ASPIRIN 81 MG PO CHEW
81.0000 mg | CHEWABLE_TABLET | Freq: Every day | ORAL | Status: DC
Start: 2016-05-12 — End: 2016-05-14
  Administered 2016-05-12 – 2016-05-14 (×3): 81 mg via ORAL
  Filled 2016-05-12 (×3): qty 1

## 2016-05-12 MED ORDER — NEOMYCIN-POLYMYXIN B GU 40-200000 IR SOLN
Status: AC
  Filled 2016-05-12: qty 20

## 2016-05-12 MED ORDER — SODIUM CHLORIDE 0.9 % IV SOLN
INTRAVENOUS | Status: DC | PRN
  Administered 2016-05-12: 60 mL

## 2016-05-12 MED ORDER — MAGNESIUM HYDROXIDE 400 MG/5ML PO SUSP
30.0000 mL | Freq: Every day | ORAL | Status: DC | PRN
  Administered 2016-05-13: 30 mL via ORAL
  Filled 2016-05-12: qty 30

## 2016-05-12 MED ORDER — TRANEXAMIC ACID 1000 MG/10ML IV SOLN
INTRAVENOUS | Status: AC
  Filled 2016-05-12: qty 10

## 2016-05-12 MED ORDER — METFORMIN HCL 500 MG PO TABS
500.0000 mg | ORAL_TABLET | Freq: Every day | ORAL | Status: DC
  Administered 2016-05-13 – 2016-05-14 (×2): 500 mg via ORAL
  Filled 2016-05-12 (×2): qty 1

## 2016-05-12 MED ORDER — POTASSIUM CHLORIDE CRYS ER 20 MEQ PO TBCR
20.0000 meq | EXTENDED_RELEASE_TABLET | Freq: Two times a day (BID) | ORAL | Status: DC
  Administered 2016-05-12 – 2016-05-14 (×5): 20 meq via ORAL
  Filled 2016-05-12 (×5): qty 1

## 2016-05-12 MED ORDER — BISACODYL 10 MG RE SUPP: 10.0000 mg | Freq: Every day | RECTAL | Status: DC | PRN

## 2016-05-12 MED ORDER — CEFAZOLIN SODIUM-DEXTROSE 2-4 GM/100ML-% IV SOLN
2.0000 g | Freq: Once | INTRAVENOUS | Status: AC
  Administered 2016-05-12: 2 g via INTRAVENOUS

## 2016-05-12 MED ORDER — ACETAMINOPHEN 500 MG PO TABS
1000.0000 mg | ORAL_TABLET | Freq: Four times a day (QID) | ORAL | Status: AC
  Administered 2016-05-12 – 2016-05-13 (×4): 1000 mg via ORAL
  Filled 2016-05-12 (×4): qty 2

## 2016-05-12 MED ORDER — ATORVASTATIN CALCIUM 20 MG PO TABS
20.0000 mg | ORAL_TABLET | Freq: Every day | ORAL | Status: DC
  Administered 2016-05-12 – 2016-05-14 (×3): 20 mg via ORAL
  Filled 2016-05-12 (×3): qty 1

## 2016-05-12 MED ORDER — ALLOPURINOL 300 MG PO TABS
300.0000 mg | ORAL_TABLET | Freq: Every day | ORAL | Status: DC
  Administered 2016-05-12 – 2016-05-14 (×3): 300 mg via ORAL
  Filled 2016-05-12 (×3): qty 1

## 2016-05-12 MED ORDER — DOCUSATE SODIUM 100 MG PO CAPS
100.0000 mg | ORAL_CAPSULE | Freq: Two times a day (BID) | ORAL | Status: DC
  Administered 2016-05-12 – 2016-05-14 (×5): 100 mg via ORAL
  Filled 2016-05-12 (×5): qty 1

## 2016-05-12 MED ORDER — METOPROLOL SUCCINATE ER 25 MG PO TB24
25.0000 mg | ORAL_TABLET | Freq: Every day | ORAL | Status: DC
  Administered 2016-05-13 – 2016-05-14 (×2): 25 mg via ORAL
  Filled 2016-05-12 (×2): qty 1

## 2016-05-12 MED ORDER — GLYCOPYRROLATE 0.2 MG/ML IJ SOLN
INTRAMUSCULAR | Status: DC | PRN
  Administered 2016-05-12: 0.2 mg via INTRAVENOUS

## 2016-05-12 MED ORDER — SODIUM CHLORIDE 0.9 % IV SOLN
INTRAVENOUS | Status: DC | PRN
  Administered 2016-05-12: 4 ug/kg/min via INTRAVENOUS

## 2016-05-12 MED ORDER — AMLODIPINE BESYLATE 10 MG PO TABS
10.0000 mg | ORAL_TABLET | Freq: Every day | ORAL | Status: DC
  Administered 2016-05-13 – 2016-05-14 (×2): 10 mg via ORAL
  Filled 2016-05-12 (×2): qty 1

## 2016-05-12 MED ORDER — PROPOFOL 500 MG/50ML IV EMUL
INTRAVENOUS | Status: DC | PRN
  Administered 2016-05-12: 40 ug/kg/min via INTRAVENOUS

## 2016-05-12 MED ORDER — OLMESARTAN-AMLODIPINE-HCTZ 40-10-25 MG PO TABS
1.0000 | ORAL_TABLET | Freq: Every day | ORAL | Status: DC
Start: 2016-05-13 — End: 2016-05-12

## 2016-05-12 MED ORDER — POTASSIUM CHLORIDE IN NACL 20-0.9 MEQ/L-% IV SOLN
INTRAVENOUS | Status: DC
  Administered 2016-05-12 – 2016-05-13 (×3): via INTRAVENOUS
  Filled 2016-05-12 (×8): qty 1000

## 2016-05-12 MED ORDER — HYDROMORPHONE HCL 1 MG/ML IJ SOLN
1.0000 mg | INTRAMUSCULAR | Status: DC | PRN
  Administered 2016-05-12 (×2): 1 mg via INTRAVENOUS
  Administered 2016-05-12: 2 mg via INTRAVENOUS
  Administered 2016-05-12 – 2016-05-13 (×4): 1 mg via INTRAVENOUS
  Administered 2016-05-13 (×2): 2 mg via INTRAVENOUS
  Filled 2016-05-12: qty 1
  Filled 2016-05-12: qty 2
  Filled 2016-05-12: qty 1
  Filled 2016-05-12: qty 2
  Filled 2016-05-12 (×3): qty 1
  Filled 2016-05-12: qty 2
  Filled 2016-05-12: qty 1

## 2016-05-12 MED ORDER — FENTANYL CITRATE (PF) 100 MCG/2ML IJ SOLN: 25.0000 ug | INTRAMUSCULAR | Status: DC | PRN

## 2016-05-12 MED ORDER — DIPHENHYDRAMINE HCL 12.5 MG/5ML PO ELIX: 12.5000 mg | ORAL_SOLUTION | ORAL | Status: DC | PRN

## 2016-05-12 MED ORDER — FENTANYL CITRATE (PF) 100 MCG/2ML IJ SOLN
INTRAMUSCULAR | Status: DC | PRN
  Administered 2016-05-12: 100 ug via INTRAVENOUS

## 2016-05-12 MED ORDER — HYDROCHLOROTHIAZIDE 25 MG PO TABS
25.0000 mg | ORAL_TABLET | Freq: Every day | ORAL | Status: DC
  Administered 2016-05-13 – 2016-05-14 (×2): 25 mg via ORAL
  Filled 2016-05-12 (×2): qty 1

## 2016-05-12 MED ORDER — MEPERIDINE HCL 25 MG/ML IJ SOLN: 6.2500 mg | INTRAMUSCULAR | Status: DC | PRN

## 2016-05-12 MED ORDER — ONDANSETRON HCL 4 MG/2ML IJ SOLN
4.0000 mg | Freq: Four times a day (QID) | INTRAMUSCULAR | Status: DC | PRN
  Administered 2016-05-13: 4 mg via INTRAVENOUS
  Filled 2016-05-12: qty 2

## 2016-05-12 MED ORDER — SODIUM CHLORIDE 0.9 % IV SOLN
INTRAVENOUS | Status: DC | PRN
  Administered 2016-05-12: 25 ug/min via INTRAVENOUS

## 2016-05-12 MED ORDER — METOCLOPRAMIDE HCL 5 MG/ML IJ SOLN
5.0000 mg | Freq: Three times a day (TID) | INTRAMUSCULAR | Status: DC | PRN
Start: 2016-05-12 — End: 2016-05-14

## 2016-05-12 MED ORDER — PANTOPRAZOLE SODIUM 40 MG PO TBEC
40.0000 mg | DELAYED_RELEASE_TABLET | Freq: Two times a day (BID) | ORAL | Status: DC
  Administered 2016-05-12 – 2016-05-14 (×5): 40 mg via ORAL
  Filled 2016-05-12 (×5): qty 1

## 2016-05-12 MED ORDER — CEFAZOLIN SODIUM-DEXTROSE 2-4 GM/100ML-% IV SOLN
2.0000 g | Freq: Four times a day (QID) | INTRAVENOUS | Status: AC
  Administered 2016-05-12 – 2016-05-13 (×3): 2 g via INTRAVENOUS
  Filled 2016-05-12 (×3): qty 100

## 2016-05-12 MED ORDER — SODIUM CHLORIDE 0.9 % IV SOLN
INTRAVENOUS | Status: DC
Start: 2016-05-12 — End: 2016-05-12
  Administered 2016-05-12: 08:00:00 via INTRAVENOUS

## 2016-05-12 MED ORDER — SODIUM CHLORIDE 0.9 % IJ SOLN
INTRAMUSCULAR | Status: AC
  Filled 2016-05-12: qty 50

## 2016-05-12 MED ORDER — FLEET ENEMA 7-19 GM/118ML RE ENEM: 1.0000 | ENEMA | Freq: Once | RECTAL | Status: DC | PRN

## 2016-05-12 MED ORDER — FAMOTIDINE 20 MG PO TABS
20.0000 mg | ORAL_TABLET | Freq: Once | ORAL | Status: AC
  Administered 2016-05-12: 20 mg via ORAL

## 2016-05-12 MED ORDER — IRBESARTAN 150 MG PO TABS
300.0000 mg | ORAL_TABLET | Freq: Every day | ORAL | Status: DC
  Administered 2016-05-12 – 2016-05-14 (×3): 300 mg via ORAL
  Filled 2016-05-12 (×3): qty 2

## 2016-05-12 MED ORDER — METOCLOPRAMIDE HCL 10 MG PO TABS: 5.0000 mg | ORAL_TABLET | Freq: Three times a day (TID) | ORAL | Status: DC | PRN

## 2016-05-12 MED ORDER — OXYCODONE HCL 5 MG PO TABS: 5.0000 mg | ORAL_TABLET | Freq: Once | ORAL | Status: DC | PRN

## 2016-05-12 MED ORDER — FAMOTIDINE 20 MG PO TABS
ORAL_TABLET | ORAL | Status: AC
  Filled 2016-05-12: qty 1

## 2016-05-12 MED ORDER — ACETAMINOPHEN 10 MG/ML IV SOLN
INTRAVENOUS | Status: DC | PRN
  Administered 2016-05-12: 1000 mg via INTRAVENOUS

## 2016-05-12 MED ORDER — PROPOFOL 10 MG/ML IV BOLUS
INTRAVENOUS | Status: DC | PRN
  Administered 2016-05-12: 18 mg via INTRAVENOUS

## 2016-05-12 MED ORDER — MIDAZOLAM HCL 5 MG/5ML IJ SOLN
INTRAMUSCULAR | Status: DC | PRN
  Administered 2016-05-12 (×2): 1 mg via INTRAVENOUS

## 2016-05-12 MED ORDER — ENOXAPARIN SODIUM 40 MG/0.4ML ~~LOC~~ SOLN
40.0000 mg | SUBCUTANEOUS | Status: DC
  Administered 2016-05-13 – 2016-05-14 (×2): 40 mg via SUBCUTANEOUS
  Filled 2016-05-12 (×2): qty 0.4

## 2016-05-12 MED ORDER — OXYCODONE HCL 5 MG/5ML PO SOLN: 5.0000 mg | Freq: Once | ORAL | Status: DC | PRN

## 2016-05-12 MED ORDER — KETOROLAC TROMETHAMINE 15 MG/ML IJ SOLN
15.0000 mg | Freq: Four times a day (QID) | INTRAMUSCULAR | Status: AC
  Administered 2016-05-12 – 2016-05-13 (×5): 15 mg via INTRAVENOUS
  Filled 2016-05-12 (×5): qty 1

## 2016-05-12 MED ORDER — CEFAZOLIN SODIUM-DEXTROSE 2-4 GM/100ML-% IV SOLN
INTRAVENOUS | Status: AC
  Filled 2016-05-12: qty 100

## 2016-05-12 MED ORDER — NEOMYCIN-POLYMYXIN B GU 40-200000 IR SOLN
Status: DC | PRN
  Administered 2016-05-12: 16 mL

## 2016-05-12 MED ORDER — BUPIVACAINE-EPINEPHRINE (PF) 0.5% -1:200000 IJ SOLN
INTRAMUSCULAR | Status: AC
  Filled 2016-05-12: qty 30

## 2016-05-12 MED ORDER — BUPIVACAINE HCL (PF) 0.5 % IJ SOLN
INTRAMUSCULAR | Status: DC | PRN
  Administered 2016-05-12: 3 mL

## 2016-05-12 MED ORDER — BUPIVACAINE-EPINEPHRINE (PF) 0.5% -1:200000 IJ SOLN
INTRAMUSCULAR | Status: DC | PRN
  Administered 2016-05-12: 30 mL via PERINEURAL

## 2016-05-12 MED ORDER — PRAVASTATIN SODIUM 20 MG PO TABS
20.0000 mg | ORAL_TABLET | Freq: Every day | ORAL | Status: DC
  Administered 2016-05-13 – 2016-05-14 (×2): 20 mg via ORAL
  Filled 2016-05-12 (×2): qty 1

## 2016-05-12 MED ORDER — KETAMINE HCL 50 MG/ML IJ SOLN
INTRAMUSCULAR | Status: DC | PRN
  Administered 2016-05-12: 5 mg via INTRAMUSCULAR
  Administered 2016-05-12: 1.8 mg via INTRAMUSCULAR

## 2016-05-12 MED ORDER — ONDANSETRON HCL 4 MG PO TABS: 4.0000 mg | ORAL_TABLET | Freq: Four times a day (QID) | ORAL | Status: DC | PRN

## 2016-05-12 MED ORDER — KETOROLAC TROMETHAMINE 30 MG/ML IJ SOLN
INTRAMUSCULAR | Status: AC
  Administered 2016-05-12: 30 mg via INTRAVENOUS
  Filled 2016-05-12: qty 1

## 2016-05-12 MED ORDER — KETOROLAC TROMETHAMINE 15 MG/ML IJ SOLN
30.0000 mg | Freq: Once | INTRAMUSCULAR | Status: DC
  Filled 2016-05-12: qty 2

## 2016-05-12 MED ORDER — ACETAMINOPHEN 10 MG/ML IV SOLN
INTRAVENOUS | Status: AC
  Filled 2016-05-12: qty 100

## 2016-05-12 MED ORDER — BUPIVACAINE LIPOSOME 1.3 % IJ SUSP
INTRAMUSCULAR | Status: AC
  Filled 2016-05-12: qty 20

## 2016-05-12 MED ORDER — COLCHICINE 0.6 MG PO TABS
0.6000 mg | ORAL_TABLET | Freq: Every day | ORAL | Status: DC
  Administered 2016-05-12 – 2016-05-14 (×3): 0.6 mg via ORAL
  Filled 2016-05-12 (×3): qty 1

## 2016-05-12 MED ORDER — OXYCODONE HCL 5 MG PO TABS
5.0000 mg | ORAL_TABLET | ORAL | Status: DC | PRN
  Administered 2016-05-12: 5 mg via ORAL
  Administered 2016-05-13 – 2016-05-14 (×6): 10 mg via ORAL
  Filled 2016-05-12: qty 2
  Filled 2016-05-12: qty 1
  Filled 2016-05-12 (×5): qty 2

## 2016-05-12 MED ORDER — TRANEXAMIC ACID 1000 MG/10ML IV SOLN
INTRAVENOUS | Status: DC | PRN
  Administered 2016-05-12: 1000 mg via INTRAVENOUS

## 2016-05-12 SURGICAL SUPPLY — 56 items
BLADE SAW SAG 25X90X1.19 (BLADE) ×3 IMPLANT
BLADE SURG SZ20 CARB STEEL (BLADE) ×3 IMPLANT
BNDG COHESIVE 6X5 TAN STRL LF (GAUZE/BANDAGES/DRESSINGS) ×3 IMPLANT
BONE CEMENT PALACOSE (Orthopedic Implant) ×6 IMPLANT
BOWL CEMENT MIX W/ADAPTER (MISCELLANEOUS) IMPLANT
CANISTER SUCT 1200ML W/VALVE (MISCELLANEOUS) ×3 IMPLANT
CANISTER SUCT 3000ML (MISCELLANEOUS) ×3 IMPLANT
CAPT KNEE TOTAL 3 ×3 IMPLANT
CATH TRAY METER 16FR LF (MISCELLANEOUS) ×3 IMPLANT
CEMENT BONE PALACOSE (Orthopedic Implant) ×2 IMPLANT
CHLORAPREP W/TINT 26ML (MISCELLANEOUS) ×3 IMPLANT
COMPACT MIXING SYSTEM ×3 IMPLANT
COOLER POLAR GLACIER W/PUMP (MISCELLANEOUS) ×3 IMPLANT
COVER MAYO STAND STRL (DRAPES) ×3 IMPLANT
CUFF TOURN 24 STER (MISCELLANEOUS) ×3 IMPLANT
CUFF TOURN 30 STER DUAL PORT (MISCELLANEOUS) IMPLANT
DRAPE IMP U-DRAPE 54X76 (DRAPES) ×3 IMPLANT
DRAPE INCISE IOBAN 66X45 STRL (DRAPES) ×6 IMPLANT
DRSG OPSITE POSTOP 4X12 (GAUZE/BANDAGES/DRESSINGS) ×3 IMPLANT
DRSG OPSITE POSTOP 4X14 (GAUZE/BANDAGES/DRESSINGS) IMPLANT
ELECT CAUTERY BLADE 6.4 (BLADE) ×3 IMPLANT
ELECT REM PT RETURN 9FT ADLT (ELECTROSURGICAL) ×3
ELECTRODE REM PT RTRN 9FT ADLT (ELECTROSURGICAL) ×1 IMPLANT
GLOVE BIO SURGEON STRL SZ7.5 (GLOVE) ×6 IMPLANT
GLOVE BIO SURGEON STRL SZ8 (GLOVE) ×6 IMPLANT
GLOVE BIOGEL PI IND STRL 8 (GLOVE) ×1 IMPLANT
GLOVE BIOGEL PI INDICATOR 8 (GLOVE) ×2
GLOVE INDICATOR 8.0 STRL GRN (GLOVE) ×3 IMPLANT
GOWN STRL REUS W/ TWL LRG LVL3 (GOWN DISPOSABLE) ×2 IMPLANT
GOWN STRL REUS W/ TWL XL LVL3 (GOWN DISPOSABLE) ×1 IMPLANT
GOWN STRL REUS W/TWL LRG LVL3 (GOWN DISPOSABLE) ×4
GOWN STRL REUS W/TWL XL LVL3 (GOWN DISPOSABLE) ×2
HANDPIECE INTERPULSE COAX TIP (DISPOSABLE) ×2
HOOD PEEL AWAY FLYTE STAYCOOL (MISCELLANEOUS) ×12 IMPLANT
IMMBOLIZER KNEE 19 BLUE UNIV (SOFTGOODS) ×3 IMPLANT
KIT RM TURNOVER STRD PROC AR (KITS) ×3 IMPLANT
NDL SAFETY 18GX1.5 (NEEDLE) ×3 IMPLANT
NEEDLE 18GX1X1/2 (RX/OR ONLY) (NEEDLE) ×3 IMPLANT
NEEDLE SPNL 20GX3.5 QUINCKE YW (NEEDLE) ×3 IMPLANT
NS IRRIG 1000ML POUR BTL (IV SOLUTION) ×3 IMPLANT
PACK TOTAL KNEE (MISCELLANEOUS) ×3 IMPLANT
PAD WRAPON POLAR KNEE (MISCELLANEOUS) ×1 IMPLANT
SET HNDPC FAN SPRY TIP SCT (DISPOSABLE) ×1 IMPLANT
SOL .9 NS 3000ML IRR  AL (IV SOLUTION) ×2
SOL .9 NS 3000ML IRR UROMATIC (IV SOLUTION) ×1 IMPLANT
STAPLER SKIN PROX 35W (STAPLE) ×3 IMPLANT
SUCTION FRAZIER HANDLE 10FR (MISCELLANEOUS) ×2
SUCTION TUBE FRAZIER 10FR DISP (MISCELLANEOUS) ×1 IMPLANT
SUT VIC AB 0 CT1 36 (SUTURE) ×9 IMPLANT
SUT VIC AB 2-0 CT1 27 (SUTURE) ×6
SUT VIC AB 2-0 CT1 TAPERPNT 27 (SUTURE) ×3 IMPLANT
SYR 20CC LL (SYRINGE) ×3 IMPLANT
SYR 30ML LL (SYRINGE) ×3 IMPLANT
SYRINGE 10CC LL (SYRINGE) ×3 IMPLANT
SYSTEM VACUUM CEMENT MIXING ×3 IMPLANT
WRAPON POLAR PAD KNEE (MISCELLANEOUS) ×3

## 2016-05-12 NOTE — Anesthesia Preprocedure Evaluation (Signed)
Anesthesia Evaluation   Patient awake    Reviewed: Allergy & Precautions, NPO status , Patient's Chart, lab work & pertinent test results, reviewed documented beta blocker date and time   History of Anesthesia Complications Negative for: history of anesthetic complications  Airway Mallampati: II  TM Distance: >3 FB Neck ROM: Full    Dental  (+) Lower Dentures, Upper Dentures   Pulmonary neg sleep apnea, neg COPD, former smoker,    breath sounds clear to auscultation- rhonchi (-) wheezing      Cardiovascular Exercise Tolerance: Good hypertension, Pt. on medications and Pt. on home beta blockers (-) CAD and (-) Past MI  Rhythm:Regular Rate:Normal - Systolic murmurs and - Diastolic murmurs    Neuro/Psych negative neurological ROS  negative psych ROS   GI/Hepatic negative GI ROS, Neg liver ROS,   Endo/Other  diabetes, Type 2, Oral Hypoglycemic Agents  Renal/GU negative Renal ROS     Musculoskeletal   Abdominal (+) + obese,   Peds  Hematology negative hematology ROS (+)   Anesthesia Other Findings Past Medical History: No date: Diabetes ([place]) No date: Gout No date: High cholesterol No date: Hypertension   Reproductive/Obstetrics                             Anesthesia Physical Anesthesia Plan  ASA: III  Anesthesia Plan: Spinal   Post-op Pain Management:    Induction:   Airway Management Planned: Natural Airway  Additional Equipment:   Intra-op Plan:   Post-operative Plan:   Informed Consent: I have reviewed the patients History and Physical, chart, labs and discussed the procedure including the risks, benefits and alternatives for the proposed anesthesia with the patient or authorized representative who has indicated his/her understanding and acceptance.   Dental advisory given  Plan Discussed with: CRNA and Anesthesiologist  Anesthesia Plan Comments:         Lab  Results  Component Value Date   WBC 8.2 05/04/2016   HGB 11.6 (L) 05/12/2016   HCT 34.0 (L) 05/12/2016   MCV 86.5 05/04/2016   PLT 380 05/04/2016    Anesthesia Quick Evaluation

## 2016-05-12 NOTE — Anesthesia Procedure Notes (Signed)
Spinal  Patient location during procedure: OR Start time: 05/12/2016 9:25 AM End time: 05/12/2016 9:35 AM Staffing Anesthesiologist: Emmie Niemann Performed: anesthesiologist  Preanesthetic Checklist Completed: patient identified, site marked, surgical consent, pre-op evaluation, timeout performed, IV checked, risks and benefits discussed and monitors and equipment checked Spinal Block Patient position: sitting Prep: ChloraPrep Patient monitoring: heart rate, continuous pulse ox, blood pressure and cardiac monitor Approach: midline Location: L4-5 Injection technique: single-shot Needle Needle type: Whitacre and Introducer  Needle gauge: 24 G Needle length: 9 cm Additional Notes Negative paresthesia. Negative blood return. Positive free-flowing CSF. Expiration date of kit checked and confirmed. Patient tolerated procedure well, without complications.

## 2016-05-12 NOTE — Transfer of Care (Signed)
Immediate Anesthesia Transfer of Care Note  Patient: Eric Browning  Procedure(s) Performed: Procedure(s): TOTAL KNEE ARTHROPLASTY (Right)  Patient Location: PACU  Anesthesia Type:Spinal  Level of Consciousness: awake and patient cooperative  Airway & Oxygen Therapy: Patient Spontanous Breathing and Patient connected to nasal cannula oxygen  Post-op Assessment: Report given to RN and Post -op Vital signs reviewed and stable  Post vital signs: Reviewed and stable  Last Vitals:  Vitals:   05/12/16 0722  BP: (!) 181/72  Pulse: 62  Resp: 16  Temp: 36.3 C    Last Pain:  Vitals:   05/12/16 0722  TempSrc: Tympanic  PainSc: 4          Complications: No apparent anesthesia complications

## 2016-05-12 NOTE — H&P (Signed)
Paper H&P to be scanned into permanent record. H&P reviewed. No changes. 

## 2016-05-12 NOTE — Op Note (Signed)
05/12/2016  11:54 AM  Patient:   Eric Browning  Pre-Op Diagnosis:   Degenerative joint disease, right knee.  Post-Op Diagnosis:   Same  Procedure:   Right TKA using all-cemented [employer] Vanguard system with a 70 mm PCR femur, a 79 mm tibial tray with a 10 mm E-poly insert, and a 37 x 10 mm all-poly 3-pegged domed patella  Surgeon:   Pascal Lux, MD  Assistant:   Cameron Proud, PA-C; Donnie Coffin, PA-S  Anesthesia:   Spinal  Findings:   As above  Complications:   None  EBL:   20 cc  Fluids:   850 cc crystalloid  UOP:   150 cc  TT:   105 minutes at 300 mmHg  Drains:   None  Closure:   Staples  Implants:   As above  Brief Clinical Note:   The patient is a 65 year old male with a long history of progressively worsening right knee pain. The patient's symptoms have progressed despite medications, activity modification, injections, etc. The patient's history and examination were consistent with advanced degenerative joint disease of the right knee confirmed by plain radiographs. The patient presents at this time for a right total knee arthroplasty.  Procedure:   The patient was brought into the operating room. After adequate spinal anesthesia was obtained, the patient was lain in the supine position. A Foley catheter was placed by the nurse before the right lower extremity was prepped with ChloraPrep solution and draped sterilely. Preoperative antibiotics were administered. After verifying the proper laterality with a surgical timeout, the limb was exsanguinated with an Esmarch and the tourniquet inflated to 300 mmHg. A standard anterior approach to the knee was made through an approximately 7 inch incision. The incision was carried down through the subcutaneous tissues to expose superficial retinaculum. This was split the length of the incision and the medial flap elevated sufficiently to expose the medial retinaculum. The medial retinaculum was incised, leaving a 3-4 mm cuff of  tissue on the patella. This was extended distally along the medial border of the patellar tendon and proximally through the medial third of the quadriceps tendon. A subtotal fat pad excision was performed before the soft tissues were elevated off the anteromedial and anterolateral aspects of the proximal tibia to the level of the collateral ligaments. The anterior portions of the medial and lateral menisci were removed, as was the anterior cruciate ligament. With the knee flexed to 90, the external tibial guide was positioned and the appropriate proximal tibial cut made. This piece was taken to the back table where it was measured and found to be optimally replicated by a 79 mm component.  Attention was directed to the distal femur. The intramedullary canal was accessed through a 3/8" drill hole. The intramedullary guide was inserted and position in order to obtain a neutral flexion gap. The intercondylar block was positioned with care taken to avoid notching the anterior cortex of the femur. The appropriate cut was made. Next, the distal cutting block was placed at 6 of valgus alignment. Using the 9 mm slot, the distal cut was made. The distal femur was measured and found to be optimally replicated by the 70 mm component. The 70 mm 4-in-1 cutting block was positioned and first the posterior, then the posterior chamfer, the anterior chamfer, femoral and intercondylar cuts were made. At this point, the posterior portions medial and lateral menisci were removed. A trial reduction was performed using the appropriate femoral and tibial components with the 10  mm insert. This demonstrated excellent stability to varus and valgus stressing both in flexion and extension while permitting full extension. Patella tracking was assessed and found to be excellent. Therefore, the tibial guide position was marked on the proximal tibia. The patella thickness was measured and found to be 23 mm. Therefore, the appropriate cut was  made. The surfaces were measured and found to be optimally replicated by the 37 mm component. The three peg holes were drilled in place before the trial button was inserted. Patella tracking was assessed and found to be excellent, passing the "no thumb test". The lug holes were drilled into the distal femur before the trial component was removed, leaving only the tibial tray. The keel was then created using the appropriate tower, reamer, and punch.  The bony surfaces were prepared for cementing by irrigating thoroughly with bacitracin saline solution. A bone plug was fashioned from some of the bone that had been removed previously and used to plug the distal femoral canal. In addition, 20 cc of Exparel diluted out to 60 cc with normal saline and 30 cc of 0.5% Sensorcaine were injected into the postero-medial and postero-lateral aspects of the knee, the medial and lateral gutter regions, and the peri-incisional tissues to help with postoperative analgesia. Meanwhile, the cement was being mixed on the back table. When it was ready, the tibial tray was cemented in first. The excess cement was removed using [employer] fast streamer. Next, the femoral component was impacted into place. Again, the excess cement was removed using [employer] fast streamer. The 10 mm trial insert was positioned and the knee brought into extension while the cement hardened. Finally, the patella was cemented into place and secured using the patellar clamp. Again, the excess cement was removed using [employer] fast streamer. Once the cement had hardened, the knee was placed through a range of motion with the findings as described above. Therefore, the trial insert was removed and, after verifying that no cement had been retained posteriorly, the permanent insert was positioned and secured using the appropriate key locking mechanism. Again the knee was placed through a range of motion with the findings as described above.  The wound was copiously irrigated with  bacitracin saline solution using the jet lavage system before the quadriceps tendon and retinacular layer were reapproximated using #0 Vicryl interrupted sutures. The superficial retinacular layer was closed using 2-0 Vicryl interrupted sutures in several layers for the skin was closed using staples. A sterile honeycomb dressing was applied to the skin before the leg was wrapped with an Ace wrap to accommodate the polar pack. The patient was then awakened and returned to the recovery room in satisfactory condition after tolerating the procedure well.

## 2016-05-12 NOTE — Anesthesia Postprocedure Evaluation (Signed)
Anesthesia Post Note  Patient: Nichalos Kosub  Procedure(s) Performed: Procedure(s) (LRB): TOTAL KNEE ARTHROPLASTY (Right)  Patient location during evaluation: PACU Anesthesia Type: Spinal Level of consciousness: oriented and awake and alert Pain management: pain level controlled Vital Signs Assessment: post-procedure vital signs reviewed and stable Respiratory status: spontaneous breathing, respiratory function stable and nonlabored ventilation Cardiovascular status: blood pressure returned to baseline and stable Postop Assessment: no headache, no backache and spinal receding Anesthetic complications: no    Last Vitals:  Vitals:   05/12/16 0722 05/12/16 1200  BP: (!) 181/72 (!) 141/67  Pulse: 62 64  Resp: 16 (!) 21  Temp: 36.3 C 36.9 C    Last Pain:  Vitals:   05/12/16 0722  TempSrc: Tympanic  PainSc: 4                  Chris Cripps

## 2016-05-12 NOTE — Evaluation (Signed)
Physical Therapy Evaluation Patient Details Name: Eric Browning MRN: [id number] DOB: 1951-01-15 Today's Date: 05/12/2016   History of Present Illness  Pt admitted for R TKR.  Clinical Impression  Pt is a pleasant 65 year old male who was admitted for R TKR. Pt performs bed mobility with min assist, transfers with cga, and ambulation with cga and RW. Pt demonstrates deficits with strength/ROM/mobility. Pt is able to perform 10 SLRs without assistance and does not require KI at this time. Would benefit from skilled PT to address above deficits and promote optimal return to PLOF. Recommend transition to [place] upon discharge from acute hospitalization.       Follow Up Recommendations Home health PT    Equipment Recommendations  None recommended by PT    Recommendations for Other Services       Precautions / Restrictions Precautions Precautions: Fall;Knee Precaution Booklet Issued: No Restrictions Weight Bearing Restrictions: Yes RLE Weight Bearing: Weight bearing as tolerated      Mobility  Bed Mobility Overal bed mobility: Needs Assistance Bed Mobility: Supine to Sit     Supine to sit: Min assist     General bed mobility comments: safe technique performed with assist required for scooting out towards EOB. Pt able to use hands on railing to pull trunk up to sit at EOB. Once seated at EOB, pt complains of dizziness that subsides after a few minutes.  Transfers Overall transfer level: Needs assistance Equipment used: Rolling walker (2 wheeled) Transfers: Sit to/from Stand Sit to Stand: Min guard         General transfer comment: transfers performed with RW with cues for correct hand placement. Once standing, pt able to stand with upright posture.  Ambulation/Gait Ambulation/Gait assistance: Min guard Ambulation Distance (Feet): 3 Feet Assistive device: Rolling walker (2 wheeled) Gait Pattern/deviations: Step-to pattern     General Gait Details: ambulated using rw  and step to gait pattern. Takes heavy cues for sequencing of gait pattern. Slight dizziness noted with ambulation.  Stairs            Wheelchair Mobility    Modified Rankin (Stroke Patients Only)       Balance Overall balance assessment: Needs assistance Sitting-balance support: Feet supported Sitting balance-Leahy Scale: Good     Standing balance support: Bilateral upper extremity supported Standing balance-Leahy Scale: Good                               Pertinent Vitals/Pain Pain Assessment: 0-10 Pain Score: 8  Pain Location: R knee Pain Descriptors / Indicators: Operative site guarding Pain Intervention(s): Limited activity within patient's tolerance;Ice applied    Home Living Family/patient expects to be discharged to:: Private residence Living Arrangements: Spouse/significant other Available Help at Discharge: Family Type of Home: House Home Access: [place]: One Friona: Environmental consultant - 2 wheels;Bedside commode;Cane - single point      Prior Function Level of Independence: Independent               Hand Dominance        Extremity/Trunk Assessment   Upper Extremity Assessment: Overall WFL for tasks assessed           Lower Extremity Assessment: Generalized weakness (R LE grossly 3/5; L LE grossly 5/5)         Communication   Communication: No difficulties  Cognition Arousal/Alertness: Awake/alert Behavior During Therapy: WFL for tasks assessed/performed  Overall Cognitive Status: Within Functional Limits for tasks assessed                      General Comments      Exercises Total Joint Exercises Goniometric ROM: R knee AAROM: 4-80 degrees Other Exercises Other Exercises: Supine ther-ex performed including R LE ankle pumps, quad sets, SLRs, and knee flexion stretches. All ther-ex performed x 10 reps with min assist for performance      Assessment/Plan    PT Assessment  Patient needs continued PT services  PT Diagnosis Difficulty walking;Abnormality of gait;Generalized weakness;Acute pain   PT Problem List Decreased strength;Decreased range of motion;Decreased activity tolerance;Decreased mobility;Decreased balance;Decreased knowledge of use of DME;Pain  PT Treatment Interventions DME instruction;Gait training;Therapeutic activities;Therapeutic exercise   PT Goals (Current goals can be found in the Care Plan section) Acute Rehab PT Goals Patient Stated Goal: to get stronger PT Goal Formulation: With patient Time For Goal Achievement: 05/26/16 Potential to Achieve Goals: Good    Frequency BID   Barriers to discharge        Co-evaluation               End of Session Equipment Utilized During Treatment: Gait belt Activity Tolerance: Patient tolerated treatment well Patient left: in chair;with chair alarm set Nurse Communication: Mobility status         Time: [id number] PT Time Calculation (min) (ACUTE ONLY): 28 min   Charges:   PT Evaluation $PT Eval Moderate Complexity: 1 Procedure PT Treatments $Therapeutic Exercise: 8-22 mins   PT G Codes:        Eric Browning 20-May-2016, 5:11 PM Eric Browning, PT, DPT [phone]

## 2016-05-12 NOTE — Progress Notes (Addendum)
CPM applied to pt. Pain meds given per pt. Request. Pt. Tolerating CPM. CPM running 70 to 100 degrees. Will continue to monitor.

## 2016-05-13 ENCOUNTER — Encounter: Payer: Self-pay | Admitting: Surgery

## 2016-05-13 LAB — BASIC METABOLIC PANEL
Anion gap: 2 — ABNORMAL LOW (ref 5–15)
BUN: 14 mg/dL (ref 6–20)
CHLORIDE: 105 mmol/L (ref 101–111)
CO2: 30 mmol/L (ref 22–32)
Calcium: 9.1 mg/dL (ref 8.9–10.3)
Creatinine, Ser: 0.82 mg/dL (ref 0.61–1.24)
GFR calc non Af Amer: >60 mL/min (ref >60)
Glucose, Bld: 141 mg/dL — ABNORMAL HIGH (ref 65–99)
POTASSIUM: 3.7 mmol/L (ref 3.5–5.1)
SODIUM: 137 mmol/L (ref 135–145)

## 2016-05-13 LAB — CBC WITH DIFFERENTIAL/PLATELET
Basophils Absolute: 0.2 10*3/uL — ABNORMAL HIGH (ref 0–0.1)
Basophils Relative: 2 %
EOS ABS: 0.1 10*3/uL (ref 0–0.7)
Eosinophils Relative: 1 %
HEMATOCRIT: 30.8 % — AB (ref 40.0–52.0)
HEMOGLOBIN: 10.5 g/dL — AB (ref 13.0–18.0)
LYMPHS ABS: 0.7 10*3/uL — AB (ref 1.0–3.6)
LYMPHS PCT: 7 %
MCH: 29.4 pg (ref 26.0–34.0)
MCHC: 34.2 g/dL (ref 32.0–36.0)
MCV: 85.9 fL (ref 80.0–100.0)
MONOS PCT: 6 %
Monocytes Absolute: 0.6 10*3/uL (ref 0.2–1.0)
NEUTROS ABS: 8.3 10*3/uL — AB (ref 1.4–6.5)
NEUTROS PCT: 84 %
Platelets: 311 10*3/uL (ref 150–440)
RBC: 3.59 MIL/uL — AB (ref 4.40–5.90)
RDW: 15 % — ABNORMAL HIGH (ref 11.5–14.5)
WBC: 10 10*3/uL (ref 3.8–10.6)

## 2016-05-13 LAB — GLUCOSE, CAPILLARY
GLUCOSE-CAPILLARY: 98 mg/dL (ref 65–99)
Glucose-Capillary: 122 mg/dL — ABNORMAL HIGH (ref 65–99)
Glucose-Capillary: 154 mg/dL — ABNORMAL HIGH (ref 65–99)

## 2016-05-13 MED ORDER — OXYCODONE HCL 5 MG PO TABS: 5.0000 mg | ORAL_TABLET | ORAL | 0 refills | Status: DC | PRN

## 2016-05-13 MED ORDER — ENOXAPARIN SODIUM 40 MG/0.4ML ~~LOC~~ SOLN: 40.0000 mg | SUBCUTANEOUS | 0 refills | Status: DC

## 2016-05-13 NOTE — Progress Notes (Signed)
CPM applied. No complaints at this time. Continue to monitor.

## 2016-05-13 NOTE — Plan of Care (Signed)
Problem: Activity: Goal: Will remain free from falls Outcome: Progressing No falls noted. Pt calling for assistance and ambulating with walker

## 2016-05-13 NOTE — Progress Notes (Signed)
CPM reapplied.

## 2016-05-13 NOTE — Care Management Note (Signed)
Case Management Note  Patient Details  Name: Eric Browning MRN: [id number] Date of Birth: [birth year]  Subjective/Objective:   Met with patient at bedside.  He lives at home with his wife who will care for him s/p discharge. He is agreeable to HHPT and prefers [facility]. Referral to [facility] with [facility]. Uses [facility] [phone])   Called Lovenox 40 mg # 14, no refills. Patient has a walker and BSC. It is anticipated that patient will be discharged home tomorrow.  Patient will have a Sunday PT visit.                 Action/Plan: Home with PT and [facility].   Expected Discharge Date:   05/13/2016               Expected Discharge Plan:  [place]  In-House Referral:     Discharge planning Services  CM Consult  Post Acute Care Choice:  Home Health Choice offered to:  Patient  DME Arranged:    DME Agency:     HH Arranged:  PT [place]:  [place]  Status of Service:  In process, will continue to follow  If discussed at Long Length of Stay Meetings, dates discussed:    Additional Comments:  Jolly Mango, RN 05/13/2016, 9:29 AM

## 2016-05-13 NOTE — Progress Notes (Signed)
CPM removed per pt. Request. Toradol administered. Pain score 10 out of 10

## 2016-05-13 NOTE — Progress Notes (Signed)
Foley d/c'd with 800cc urine

## 2016-05-13 NOTE — Discharge Instructions (Signed)

## 2016-05-13 NOTE — Progress Notes (Signed)
Pt. Alert and oriented. VSS. Pt. Has had a high pain rating all night. Medicated per MAR. Dangled to side of bed. Foley removed this morning. Polarcare in place. Towel rolls under heels. IS at the bedside and pt. Encouraged to use it. Tolerated CPM for a short while last night. Pt. Encouraged to wear cpm but insisted it be taken off after a short while d/t his pain level. Resting quietly at this time. Will continue to monitor.
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
## 2016-05-13 NOTE — Progress Notes (Signed)
Clinical Social Worker (CSW) received SNF consult. PT is recommending home health. RN Case Manager is aware of above. Please reconsult if future social work needs arise. CSW signing off.   Bee Marchiano, LCSW [phone] 

## 2016-05-13 NOTE — Progress Notes (Signed)
Patient had nausea episode with some emesis. Zofran given with relief.

## 2016-05-13 NOTE — Progress Notes (Signed)
Subjective: 1 Day Post-Op Procedure(s) (LRB): TOTAL KNEE ARTHROPLASTY (Right) Patient reports pain as mild.   Patient is well, and has had no acute complaints or problems Plan is to go home with home-health PT after hospital stay. Negative for chest pain and shortness of breath Fever: no Gastrointestinal:Negative for nausea and vomiting this AM.  Objective: Vital signs in last 24 hours: Temp:  [97.1 F (36.2 C)-98.4 F (36.9 C)] 97.7 F (36.5 C) (09/15 0345) Pulse Rate:  [54-78] 72 (09/15 0345) Resp:  [13-21] 16 (09/15 0345) BP: (130-183)/(58-85) 183/85 (09/15 0345) SpO2:  [93 %-100 %] 98 % (09/15 0345) Weight:  [93 kg (205 lb)] 93 kg (205 lb) (09/14 1230)  Intake/Output from previous day:  Intake/Output Summary (Last 24 hours) at 05/13/16 0747 Last data filed at 05/13/16 0739  Gross per 24 hour  Intake             2408 ml  Output             3050 ml  Net             -642 ml    Intake/Output this shift: Total I/O In: -  Out: 500 [Urine:500]  Labs:  Recent Labs  05/12/16 0737 05/13/16 0400  HGB 11.6* 10.5*    Recent Labs  05/12/16 0737 05/13/16 0400  WBC  --  10.0  RBC  --  3.59*  HCT 34.0* 30.8*  PLT  --  311    Recent Labs  05/12/16 0737 05/13/16 0400  NA 143 137  K 3.5 3.7  CL  --  105  CO2  --  30  BUN  --  14  CREATININE  --  0.82  GLUCOSE 121* 141*  CALCIUM  --  9.1   No results for input(s): LABPT, INR in the last 72 hours.  EXAM General - Patient is Alert, Appropriate and Oriented Extremity - ABD soft Sensation intact distally Intact pulses distally Dorsiflexion/Plantar flexion intact Incision: dressing C/D/I Dressing/Incision - clean, dry, no drainage Motor Function - intact, moving foot and toes well on exam.   Abdomen soft, mild distention without tympany.  Past Medical History:  Diagnosis Date  . Diabetes ([place])   . Gout   . High cholesterol   . Hypertension     Assessment/Plan: 1 Day Post-Op Procedure(s)  (LRB): TOTAL KNEE ARTHROPLASTY (Right) Active Problems:   Status post total right knee replacement using cement  Estimated body mass index is 33.09 kg/m as calculated from the following:   Height as of this encounter: 5\' 6"  (1.676 m).   Weight as of this encounter: 93 kg (205 lb). Advance diet Up with therapy D/C IV fluids when tolerating PO intake.  Labs reviewed, Hg 10.5 this AM.  CBC and BMP ordered for tomorrow. BP 183/85, may be related to pain, will take off of IVF today when tolerating diet. Foley removed this AM. Pt needs to have a BM prior to discharge PT currently recommending HHPT, Care Management to assist with discharge. Plan for discharge home tomorrow pending additional PT sessions.  DVT Prophylaxis - Lovenox, Foot Pumps and TED hose Weight-Bearing as tolerated to right leg  J. Cameron Proud, PA-C [facility] 05/13/2016, 7:47 AM

## 2016-05-13 NOTE — Progress Notes (Signed)
Physical Therapy Treatment Patient Details Name: Eric Browning MRN: [id number] DOB: [birth year] Today's Date: 05/13/2016    History of Present Illness Pt admitted for R TKR.    PT Comments    Pt is making good progress towards goals. Improved gait distance performed with reciprocal gait pattern. Pt continues to be motivated to perform therapy. Good endurance noted and no nausea symptoms noted this session. Continue to progress session with further ambulation and stair training.  Follow Up Recommendations  Home health PT     Equipment Recommendations  None recommended by PT    Recommendations for Other Services       Precautions / Restrictions Precautions Precautions: Fall;Knee Precaution Booklet Issued: No Restrictions Weight Bearing Restrictions: Yes RLE Weight Bearing: Weight bearing as tolerated    Mobility  Bed Mobility Overal bed mobility: Needs Assistance Bed Mobility: Sit to Supine       Sit to supine: Min guard   General bed mobility comments: safe technique performed with pt able to use B hands to scoot back up on the bed. Pt able to use trapeze bar to scoot up towards [place].  Transfers Overall transfer level: Needs assistance Equipment used: Rolling walker (2 wheeled) Transfers: Sit to/from Stand Sit to Stand: Supervision         General transfer comment: Safe technique performed with cues for hand placement. Once standing, upright posture noted. Slight dizziness noted, however resolved after a few minutes of standing.  Ambulation/Gait Ambulation/Gait assistance: Min guard Ambulation Distance (Feet): 140 Feet Assistive device: Rolling walker (2 wheeled) Gait Pattern/deviations: Step-through pattern     General Gait Details: ambulated using RW and reciprocal gait pattern. Pt able to demonstrate improved gait speed with less fatigue this date. Cues given for upright posture and to look forward instead of looking down. Pt also required cues for increased  step length on R LE.   Stairs            Wheelchair Mobility    Modified Rankin (Stroke Patients Only)       Balance                                    Cognition Arousal/Alertness: Awake/alert Behavior During Therapy: WFL for tasks assessed/performed Overall Cognitive Status: Within Functional Limits for tasks assessed                      Exercises Other Exercises Other Exercises: Supine ther-ex performed including R LE ankle pumps, quad sets, SAQ, LAQ, SLR, and hip abd/add. All ther-ex performed x 12 reps with cga for assistance. Cues given for correct technique.    General Comments        Pertinent Vitals/Pain Pain Assessment: 0-10 Pain Score: 4  Pain Location: R knee Pain Descriptors / Indicators: Operative site guarding Pain Intervention(s): Limited activity within patient's tolerance;Ice applied    Home Living                      Prior Function            PT Goals (current goals can now be found in the care plan section) Acute Rehab PT Goals Patient Stated Goal: to get stronger PT Goal Formulation: With patient Time For Goal Achievement: 05/26/16 Potential to Achieve Goals: Good Progress towards PT goals: Progressing toward goals    Frequency    BID  PT Plan Current plan remains appropriate    Co-evaluation             End of Session Equipment Utilized During Treatment: Gait belt Activity Tolerance: Patient limited by pain Patient left: in bed;with bed alarm set;with SCD's reapplied     Time: [id number] PT Time Calculation (min) (ACUTE ONLY): 25 min  Charges:  $Gait Training: 8-22 mins $Therapeutic Exercise: 8-22 mins                    G Codes:      Julies Carmickle 2016/05/15, 4:12 PM  Greggory Stallion, PT, DPT [phone]

## 2016-05-13 NOTE — Progress Notes (Signed)
Physical Therapy Treatment Patient Details Name: Eric Browning MRN: [id number] DOB: February 04, 1951 Today's Date: 05/13/2016    History of Present Illness Pt admitted for R TKR.    PT Comments    Pt is making good progress towards goals. Pt limited this date by nausea/vomiting and pain. Pt continues to be motivated to perform therapy and demonstrates improved functional independence this date. Good progress with AAROM and able to tolerate increased reps for there-ex. Noted improvement in ambulation distance and fluid gait pattern. Slow gait speed. Will continue to assess needs.  Follow Up Recommendations  Home health PT     Equipment Recommendations  None recommended by PT    Recommendations for Other Services       Precautions / Restrictions Precautions Precautions: Fall;Knee Precaution Booklet Issued: No Restrictions Weight Bearing Restrictions: Yes RLE Weight Bearing: Weight bearing as tolerated    Mobility  Bed Mobility Overal bed mobility: Needs Assistance Bed Mobility: Supine to Sit     Supine to sit: Min assist     General bed mobility comments: safe technique performed with assist required for scooting out towards EOB. Pt able to use hands on railing to pull trunk up to sit at EOB. Once seated at EOB, pt complains of dizziness that subsides after a few minutes. After sitting at EOB, pt then becomes nauesous and vomits. RN aware and gave nausea meds  Transfers Overall transfer level: Needs assistance Equipment used: Rolling walker (2 wheeled) Transfers: Sit to/from Stand Sit to Stand: Min guard         General transfer comment: transfers performed with RW with cues for correct hand placement. Once standing, pt able to stand with upright posture. Bed elevated for ease  Ambulation/Gait Ambulation/Gait assistance: Min guard Ambulation Distance (Feet): 60 Feet Assistive device: Rolling walker (2 wheeled) Gait Pattern/deviations: Step-through pattern     General  Gait Details: ambulated using rw and step to gait pattern. Pt then able to improve to reciprocal gait pattern with ability to keep RW moving. Pt with very slow gait speed. No complaints of dizziness with ambulation and pt able to demonstrate upright posture   Stairs            Wheelchair Mobility    Modified Rankin (Stroke Patients Only)       Balance                                    Cognition Arousal/Alertness: Awake/alert Behavior During Therapy: WFL for tasks assessed/performed Overall Cognitive Status: Within Functional Limits for tasks assessed                      Exercises Total Joint Exercises Goniometric ROM: R knee AAROM: 3-85 degrees Other Exercises Other Exercises: Supine ther-ex performed including R LE ankle pumps, quad sets, SAQ, SLRs, and knee flexion stretches. All ther-ex performed x 12 reps with min assist for performance    General Comments        Pertinent Vitals/Pain Pain Assessment: 0-10 Pain Score: 6  Pain Location: R knee Pain Descriptors / Indicators: Operative site guarding Pain Intervention(s): Premedicated before session;Limited activity within patient's tolerance;Ice applied    Home Living                      Prior Function            PT Goals (current goals can now  be found in the care plan section) Acute Rehab PT Goals Patient Stated Goal: to get stronger PT Goal Formulation: With patient Time For Goal Achievement: 05/26/16 Potential to Achieve Goals: Good Progress towards PT goals: Progressing toward goals    Frequency   PT Diagnosis  BID   Status post total right knee replacement using cement - Plan: DG Knee Right Port, DG Knee Right Port     PT Plan Current plan remains appropriate    Co-evaluation             End of Session Equipment Utilized During Treatment: Gait belt Activity Tolerance: Patient limited by pain Patient left: in chair;with chair alarm set      Time: [phone] PT Time Calculation (min) (ACUTE ONLY): 48 min  Charges:  $Gait Training: 23-37 mins $Therapeutic Exercise: 8-22 mins                    G Codes:      Djuan Talton 05-18-2016, 11:41 AM  Greggory Stallion, PT, DPT [phone]

## 2016-05-14 LAB — CBC
HEMATOCRIT: 33.2 % — AB (ref 40.0–52.0)
HEMOGLOBIN: 10.9 g/dL — AB (ref 13.0–18.0)
MCH: 28.5 pg (ref 26.0–34.0)
MCHC: 32.9 g/dL (ref 32.0–36.0)
MCV: 86.7 fL (ref 80.0–100.0)
Platelets: 329 10*3/uL (ref 150–440)
RBC: 3.83 MIL/uL — ABNORMAL LOW (ref 4.40–5.90)
RDW: 14.8 % — ABNORMAL HIGH (ref 11.5–14.5)
WBC: 10.4 10*3/uL (ref 3.8–10.6)

## 2016-05-14 LAB — BASIC METABOLIC PANEL
ANION GAP: 8 (ref 5–15)
BUN: 17 mg/dL (ref 6–20)
CALCIUM: 9.7 mg/dL (ref 8.9–10.3)
CO2: 27 mmol/L (ref 22–32)
Chloride: 104 mmol/L (ref 101–111)
Creatinine, Ser: 1.17 mg/dL (ref 0.61–1.24)
GFR calc Af Amer: >60 mL/min (ref >60)
GFR calc non Af Amer: >60 mL/min (ref >60)
GLUCOSE: 145 mg/dL — AB (ref 65–99)
Potassium: 3.8 mmol/L (ref 3.5–5.1)
Sodium: 139 mmol/L (ref 135–145)

## 2016-05-14 MED ORDER — LACTULOSE 10 GM/15ML PO SOLN
20.0000 g | Freq: Once | ORAL | Status: AC
  Administered 2016-05-14: 20 g via ORAL
  Filled 2016-05-14: qty 30

## 2016-05-14 NOTE — Care Management Note (Signed)
Case Management Note  Patient Details  Name: Eric Browning MRN: [id number] Date of Birth: [birth year]  Subjective/Objective:     A referral for home health PT has been faxed to [facility]. Eric Browning already has a RW and a BSC. His Lovenox Co-Pay is $15.00. Eric Browning is being discharged to home today.               Action/Plan:   Expected Discharge Date:                  Expected Discharge Plan:  [place]  In-House Referral:     Discharge planning Services  CM Consult  Post Acute Care Choice:  Home Health Choice offered to:  Patient  DME Arranged:    DME Agency:     HH Arranged:  PT [place]:  [facility]  Status of Service:  In process, will continue to follow  If discussed at Long Length of Stay Meetings, dates discussed:    Additional Comments:  Eric Browning A, RN 05/14/2016, 8:59 AM

## 2016-05-14 NOTE — Progress Notes (Signed)
Pt is alert and oriented. Medicated through the night for pain with good results. Milk of magnesia given for bowel movement at bedtime still awaiting results.  Tolerating po intake without difficulty.

## 2016-05-14 NOTE — Progress Notes (Signed)
   Subjective: 2 Days Post-Op Procedure(s) (LRB): TOTAL KNEE ARTHROPLASTY (Right) Patient reports pain as 7 on 0-10 scale.   Patient is well, and has had no acute complaints or problems Denies any CP, SOB, ABD pain. We will continue therapy today.  Plan is to go Home after hospital stay.  Objective: Vital signs in last 24 hours: Temp:  [97.8 F (36.6 C)-98.9 F (37.2 C)] 98.9 F (37.2 C) (09/16 0350) Pulse Rate:  [72-86] 86 (09/16 0350) Resp:  [16-19] 19 (09/16 0350) BP: (155-184)/(70-80) 155/79 (09/16 0350) SpO2:  [92 %-98 %] 92 % (09/16 0350)  Intake/Output from previous day: 09/15 0701 - 09/16 0700 In: 2996.7 [P.O.:480; I.V.:1776.7] Out: 2700 [Urine:2700] Intake/Output this shift: Total I/O In: 1776.7 [I.V.:1776.7] Out: 1050 [Urine:1050]   Recent Labs  05/12/16 0737 05/13/16 0400  HGB 11.6* 10.5*    Recent Labs  05/12/16 0737 05/13/16 0400  WBC  --  10.0  RBC  --  3.59*  HCT 34.0* 30.8*  PLT  --  311    Recent Labs  05/13/16 0400 05/14/16 0352  NA 137 139  K 3.7 3.8  CL 105 104  CO2 30 27  BUN 14 17  CREATININE 0.82 1.17  GLUCOSE 141* 145*  CALCIUM 9.1 9.7   No results for input(s): LABPT, INR in the last 72 hours.  EXAM General - Patient is Alert, Appropriate and Oriented Extremity - ABD soft Neurovascular intact Sensation intact distally Intact pulses distally Dorsiflexion/Plantar flexion intact No cellulitis present Compartment soft Dressing - scant drainage and new dressing applied Motor Function - intact, moving foot and toes well on exam. CPM intact.  Past Medical History:  Diagnosis Date  . Diabetes ([place])   . Gout   . High cholesterol   . Hypertension     Assessment/Plan:   2 Days Post-Op Procedure(s) (LRB): TOTAL KNEE ARTHROPLASTY (Right) Active Problems:   Status post total right knee replacement using cement  Estimated body mass index is 33.09 kg/m as calculated from the following:   Height as of this encounter:  5\' 6"  (1.676 m).   Weight as of this encounter: 93 kg (205 lb). Advance diet Up with therapy Discharge home with home health today pending BM and completion of PT goals.  Labs and vital signs stable, CBC pending this am Follow up with [facility] ortho in 2 weeks    DVT Prophylaxis - Lovenox, Foot Pumps and TED hose Weight-Bearing as tolerated to right leg   T. Rachelle Hora, PA-C [place] 05/14/2016, 6:58 AM

## 2016-05-14 NOTE — Progress Notes (Signed)
Physical Therapy Treatment Patient Details Name: Eric Browning MRN: [id number] DOB: [birth year] Today's Date: 05/14/2016    History of Present Illness Pt admitted for R TKR.    PT Comments    Patient was pleasant and happy to participate in PT. Demonstrates decreased cadence during ambulation but able to correct cadence/posture with verbal cues. Performed steps with verbal cues for proper sequencing, showing decreased knee flexion on R when ascending, leading with L. Noted moderate fatigue following stair training, but patient reports not having steps at home. Patient has continued to progress towards goals and current discharge plan remains appropriate. He still has impairments of pain, gait, decreased ROM, strength, and function and will continue to benefit from skilled and progressive PT while admitted.  Follow Up Recommendations  Home health PT     Equipment Recommendations  None recommended by PT    Recommendations for Other Services       Precautions / Restrictions Precautions Precautions: Fall Precaution Booklet Issued: No Restrictions Weight Bearing Restrictions: Yes RLE Weight Bearing: Weight bearing as tolerated    Mobility  Bed Mobility                  Transfers Overall transfer level: Needs assistance Equipment used: Rolling walker (2 wheeled) Transfers: Sit to/from Stand Sit to Stand: Min guard         General transfer comment: Patient demonstrates intact safety awareness.  Ambulation/Gait Ambulation/Gait assistance: Min guard Ambulation Distance (Feet): 100 Feet Assistive device: Rolling walker (2 wheeled)       General Gait Details: Patient ambulates with RW at decreased cadence. Verbal cues to step through and stand upright.   Stairs Stairs: Yes Stairs assistance: Min guard Stair Management: Two rails Number of Stairs: 4 General stair comments: Patient requires verbal cues for proper sequencing ascending/descending stairs.  Wheelchair  Mobility    Modified Rankin (Stroke Patients Only)       Balance Overall balance assessment: Needs assistance Sitting-balance support: Feet supported Sitting balance-Leahy Scale: Good     Standing balance support: Bilateral upper extremity supported Standing balance-Leahy Scale: Good                      Cognition Arousal/Alertness: Awake/alert Behavior During Therapy: WFL for tasks assessed/performed Overall Cognitive Status: Within Functional Limits for tasks assessed                      Exercises Total Joint Exercises Ankle Circles/Pumps: AROM;20 reps Quad Sets: Strengthening;AROM;15 reps Hip ABduction/ADduction: AROM;15 reps Straight Leg Raises: Strengthening;AAROM;10 reps Long Arc Quad: AAROM;Strengthening;15 reps Goniometric ROM: +7-78 deg. sitting in chair    General Comments        Pertinent Vitals/Pain Pain Assessment: 0-10 Pain Score: 6  Pain Location: R knee Pain Descriptors / Indicators: Aching Pain Intervention(s): Limited activity within patient's tolerance;Monitored during session;Premedicated before session;Ice applied    Home Living                      Prior Function            PT Goals (current goals can now be found in the care plan section) Acute Rehab PT Goals Patient Stated Goal: To get stronger PT Goal Formulation: With patient Time For Goal Achievement: 05/26/16 Potential to Achieve Goals: Good Progress towards PT goals: Progressing toward goals    Frequency    BID      PT Plan Current plan remains appropriate  Co-evaluation             End of Session Equipment Utilized During Treatment: Gait belt Activity Tolerance: Patient tolerated treatment well;Patient limited by fatigue Patient left: in chair;with call bell/phone within reach;with chair alarm set     Time: [id number] PT Time Calculation (min) (ACUTE ONLY): 33 min  Charges:  $Gait Training: 8-22 mins $Therapeutic Exercise:  8-22 mins                    G Codes:      Dorice Lamas, PT, DPT 05/14/2016, 9:54 AM

## 2016-06-06 ENCOUNTER — Encounter: Payer: Self-pay | Admitting: Surgery

## 2016-06-28 DIAGNOSIS — Z7189 Other specified counseling: Secondary | ICD-10-CM | POA: Insufficient documentation

## 2016-06-28 DIAGNOSIS — D649 Anemia, unspecified: Secondary | ICD-10-CM | POA: Insufficient documentation

## 2016-06-28 DIAGNOSIS — Z7185 Encounter for immunization safety counseling: Secondary | ICD-10-CM | POA: Insufficient documentation

## 2016-12-09 ENCOUNTER — Observation Stay: Payer: Managed Care, Other (non HMO)

## 2016-12-09 ENCOUNTER — Emergency Department: Payer: Managed Care, Other (non HMO)

## 2016-12-09 ENCOUNTER — Encounter: Payer: Self-pay | Admitting: Emergency Medicine

## 2016-12-09 ENCOUNTER — Observation Stay
Admission: EM | Admit: 2016-12-09 | Discharge: 2016-12-09 | Disposition: A | Discharge status: Home / Self Care | Payer: Managed Care, Other (non HMO) | Attending: Internal Medicine | Admitting: Internal Medicine

## 2016-12-09 DIAGNOSIS — I10 Essential (primary) hypertension: Secondary | ICD-10-CM | POA: Diagnosis not present

## 2016-12-09 DIAGNOSIS — R0789 Other chest pain: Secondary | ICD-10-CM | POA: Insufficient documentation

## 2016-12-09 DIAGNOSIS — E785 Hyperlipidemia, unspecified: Secondary | ICD-10-CM | POA: Insufficient documentation

## 2016-12-09 DIAGNOSIS — Z87891 Personal history of nicotine dependence: Secondary | ICD-10-CM | POA: Diagnosis not present

## 2016-12-09 DIAGNOSIS — I200 Unstable angina: Secondary | ICD-10-CM

## 2016-12-09 DIAGNOSIS — Z7984 Long term (current) use of oral hypoglycemic drugs: Secondary | ICD-10-CM | POA: Diagnosis not present

## 2016-12-09 DIAGNOSIS — Z79899 Other long term (current) drug therapy: Secondary | ICD-10-CM | POA: Insufficient documentation

## 2016-12-09 DIAGNOSIS — R079 Chest pain, unspecified: Secondary | ICD-10-CM | POA: Diagnosis present

## 2016-12-09 DIAGNOSIS — Z96651 Presence of right artificial knee joint: Secondary | ICD-10-CM | POA: Diagnosis not present

## 2016-12-09 DIAGNOSIS — M109 Gout, unspecified: Secondary | ICD-10-CM | POA: Diagnosis not present

## 2016-12-09 DIAGNOSIS — E669 Obesity, unspecified: Secondary | ICD-10-CM | POA: Insufficient documentation

## 2016-12-09 DIAGNOSIS — M940 Chondrocostal junction syndrome [Tietze]: Principal | ICD-10-CM | POA: Insufficient documentation

## 2016-12-09 DIAGNOSIS — Z7982 Long term (current) use of aspirin: Secondary | ICD-10-CM | POA: Insufficient documentation

## 2016-12-09 DIAGNOSIS — Z791 Long term (current) use of non-steroidal anti-inflammatories (NSAID): Secondary | ICD-10-CM | POA: Insufficient documentation

## 2016-12-09 DIAGNOSIS — E876 Hypokalemia: Secondary | ICD-10-CM | POA: Insufficient documentation

## 2016-12-09 DIAGNOSIS — E7800 Pure hypercholesterolemia, unspecified: Secondary | ICD-10-CM | POA: Diagnosis not present

## 2016-12-09 DIAGNOSIS — Z6832 Body mass index (BMI) 32.0-32.9, adult: Secondary | ICD-10-CM | POA: Diagnosis not present

## 2016-12-09 DIAGNOSIS — E119 Type 2 diabetes mellitus without complications: Secondary | ICD-10-CM | POA: Diagnosis not present

## 2016-12-09 LAB — NM MYOCAR MULTI W/SPECT W/WALL MOTION / EF
CHL CUP NUCLEAR SDS: 3
CHL CUP NUCLEAR SRS: 0
CHL CUP RESTING HR STRESS: 74 {beats}/min
CHL CUP STRESS STAGE 1 HR: 71 {beats}/min
CHL CUP STRESS STAGE 2 SPEED: 0 mph
CHL CUP STRESS STAGE 3 HR: 76 {beats}/min
CHL CUP STRESS STAGE 4 GRADE: 0 %
CHL CUP STRESS STAGE 4 HR: 91 {beats}/min
CHL CUP STRESS STAGE 5 GRADE: 0 %
CHL CUP STRESS STAGE 5 HR: 96 {beats}/min
CHL CUP STRESS STAGE 6 DBP: 73 mmHg
CHL CUP STRESS STAGE 6 GRADE: 0 %
CSEPEW: 1 METS
CSEPPHR: 91 {beats}/min
Exercise duration (min): 1 min
Exercise duration (sec): 0 s
LV dias vol: 142 mL (ref 62–150)
LV sys vol: 70 mL
MPHR: 152 {beats}/min
Percent HR: 65 %
Percent of predicted max HR: 59 %
SSS: 3
Stage 2 Grade: 0 %
Stage 2 HR: 76 {beats}/min
Stage 3 Grade: 0 %
Stage 3 Speed: 0 mph
Stage 4 Speed: 0 mph
Stage 5 Speed: 0 mph
Stage 6 HR: 88 {beats}/min
Stage 6 SBP: 135 mmHg
Stage 6 Speed: 0 mph
TID: 0.96

## 2016-12-09 LAB — CBC
HCT: 33.5 % — ABNORMAL LOW (ref 40.0–52.0)
Hemoglobin: 11.1 g/dL — ABNORMAL LOW (ref 13.0–18.0)
MCH: 28.5 pg (ref 26.0–34.0)
MCHC: 33.3 g/dL (ref 32.0–36.0)
MCV: 85.6 fL (ref 80.0–100.0)
PLATELETS: 331 10*3/uL (ref 150–440)
RBC: 3.91 MIL/uL — ABNORMAL LOW (ref 4.40–5.90)
RDW: 15.3 % — AB (ref 11.5–14.5)
WBC: 10.1 10*3/uL (ref 3.8–10.6)

## 2016-12-09 LAB — BASIC METABOLIC PANEL
Anion gap: 6 (ref 5–15)
BUN: 26 mg/dL — ABNORMAL HIGH (ref 6–20)
CHLORIDE: 103 mmol/L (ref 101–111)
CO2: 32 mmol/L (ref 22–32)
CREATININE: 1.14 mg/dL (ref 0.61–1.24)
Calcium: 10.4 mg/dL — ABNORMAL HIGH (ref 8.9–10.3)
GFR calc Af Amer: >60 mL/min (ref >60)
GFR calc non Af Amer: >60 mL/min (ref >60)
Glucose, Bld: 116 mg/dL — ABNORMAL HIGH (ref 65–99)
Potassium: 3 mmol/L — ABNORMAL LOW (ref 3.5–5.1)
SODIUM: 141 mmol/L (ref 135–145)

## 2016-12-09 LAB — TROPONIN I: Troponin I: <0.03 ng/mL (ref <0.03)

## 2016-12-09 MED ORDER — NITROGLYCERIN 2 % TD OINT
1.0000 [in_us] | TOPICAL_OINTMENT | Freq: Once | TRANSDERMAL | Status: AC
  Administered 2016-12-09: 1 [in_us] via TOPICAL
  Filled 2016-12-09: qty 1

## 2016-12-09 MED ORDER — ENOXAPARIN SODIUM 40 MG/0.4ML ~~LOC~~ SOLN: 40.0000 mg | SUBCUTANEOUS | Status: DC

## 2016-12-09 MED ORDER — ASPIRIN 81 MG PO CHEW
324.0000 mg | CHEWABLE_TABLET | Freq: Once | ORAL | Status: AC
  Administered 2016-12-09: 324 mg via ORAL
  Filled 2016-12-09: qty 4

## 2016-12-09 MED ORDER — PRAVASTATIN SODIUM 20 MG PO TABS: 20.0000 mg | ORAL_TABLET | Freq: Every day | ORAL | Status: DC

## 2016-12-09 MED ORDER — AMLODIPINE BESYLATE 10 MG PO TABS
10.0000 mg | ORAL_TABLET | Freq: Every day | ORAL | Status: DC
  Administered 2016-12-09: 10 mg via ORAL
  Filled 2016-12-09: qty 1

## 2016-12-09 MED ORDER — ASPIRIN 300 MG RE SUPP: 300.0000 mg | RECTAL | Status: DC

## 2016-12-09 MED ORDER — ASPIRIN EC 81 MG PO TBEC: 81.0000 mg | DELAYED_RELEASE_TABLET | Freq: Every day | ORAL | Status: DC

## 2016-12-09 MED ORDER — OLMESARTAN-AMLODIPINE-HCTZ 40-10-25 MG PO TABS: 1.0000 | ORAL_TABLET | Freq: Every day | ORAL | Status: DC

## 2016-12-09 MED ORDER — ETODOLAC 200 MG PO CAPS
200.0000 mg | ORAL_CAPSULE | Freq: Three times a day (TID) | ORAL | Status: DC
  Filled 2016-12-09 (×3): qty 1

## 2016-12-09 MED ORDER — NITROGLYCERIN 0.4 MG SL SUBL: 0.4000 mg | SUBLINGUAL_TABLET | SUBLINGUAL | Status: DC | PRN

## 2016-12-09 MED ORDER — TRAMADOL HCL 50 MG PO TABS: 50.0000 mg | ORAL_TABLET | Freq: Four times a day (QID) | ORAL | 0 refills | Status: DC | PRN

## 2016-12-09 MED ORDER — ACETAMINOPHEN 325 MG PO TABS: 650.0000 mg | ORAL_TABLET | ORAL | Status: DC | PRN

## 2016-12-09 MED ORDER — SODIUM CHLORIDE 0.9 % IV SOLN: 250.0000 mL | INTRAVENOUS | Status: DC | PRN

## 2016-12-09 MED ORDER — SODIUM CHLORIDE 0.9% FLUSH
3.0000 mL | Freq: Two times a day (BID) | INTRAVENOUS | Status: DC
  Administered 2016-12-09: 3 mL via INTRAVENOUS

## 2016-12-09 MED ORDER — HYDROCHLOROTHIAZIDE 25 MG PO TABS
25.0000 mg | ORAL_TABLET | Freq: Every day | ORAL | Status: DC
  Administered 2016-12-09: 25 mg via ORAL
  Filled 2016-12-09: qty 1

## 2016-12-09 MED ORDER — POTASSIUM CHLORIDE CRYS ER 20 MEQ PO TBCR
40.0000 meq | EXTENDED_RELEASE_TABLET | Freq: Once | ORAL | Status: AC
  Administered 2016-12-09: 40 meq via ORAL
  Filled 2016-12-09: qty 2

## 2016-12-09 MED ORDER — METOPROLOL TARTRATE 25 MG PO TABS
25.0000 mg | ORAL_TABLET | Freq: Two times a day (BID) | ORAL | Status: DC
  Administered 2016-12-09: 25 mg via ORAL
  Filled 2016-12-09: qty 1

## 2016-12-09 MED ORDER — TECHNETIUM TC 99M TETROFOSMIN IV KIT
12.7520 | PACK | Freq: Once | INTRAVENOUS | Status: AC | PRN
  Administered 2016-12-09: 12.752 via INTRAVENOUS

## 2016-12-09 MED ORDER — REGADENOSON 0.4 MG/5ML IV SOLN
0.4000 mg | Freq: Once | INTRAVENOUS | Status: AC
  Administered 2016-12-09: 0.4 mg via INTRAVENOUS

## 2016-12-09 MED ORDER — CYCLOBENZAPRINE HCL 10 MG PO TABS: 10.0000 mg | ORAL_TABLET | Freq: Three times a day (TID) | ORAL | 0 refills | Status: DC | PRN

## 2016-12-09 MED ORDER — ASPIRIN 81 MG PO TBEC: 81.0000 mg | DELAYED_RELEASE_TABLET | Freq: Every day | ORAL | 2 refills | Status: DC

## 2016-12-09 MED ORDER — ASPIRIN 81 MG PO CHEW: 324.0000 mg | CHEWABLE_TABLET | ORAL | Status: DC

## 2016-12-09 MED ORDER — ONDANSETRON HCL 4 MG/2ML IJ SOLN: 4.0000 mg | Freq: Four times a day (QID) | INTRAMUSCULAR | Status: DC | PRN

## 2016-12-09 MED ORDER — TECHNETIUM TC 99M TETROFOSMIN IV KIT
30.0660 | PACK | Freq: Once | INTRAVENOUS | Status: AC | PRN
  Administered 2016-12-09: 30.066 via INTRAVENOUS

## 2016-12-09 MED ORDER — SODIUM CHLORIDE 0.9% FLUSH: 3.0000 mL | INTRAVENOUS | Status: DC | PRN

## 2016-12-09 MED ORDER — COLCHICINE 0.6 MG PO TABS
0.6000 mg | ORAL_TABLET | Freq: Every day | ORAL | Status: DC
Start: 2016-12-09 — End: 2016-12-09
  Administered 2016-12-09: 0.6 mg via ORAL
  Filled 2016-12-09: qty 1

## 2016-12-09 MED ORDER — IRBESARTAN 150 MG PO TABS
300.0000 mg | ORAL_TABLET | Freq: Every day | ORAL | Status: DC
  Administered 2016-12-09: 300 mg via ORAL
  Filled 2016-12-09: qty 2

## 2016-12-09 MED ORDER — MORPHINE SULFATE (PF) 2 MG/ML IV SOLN
2.0000 mg | INTRAVENOUS | Status: DC | PRN
  Administered 2016-12-09: 2 mg via INTRAVENOUS
  Filled 2016-12-09 (×2): qty 1

## 2016-12-09 MED ORDER — ALLOPURINOL 300 MG PO TABS
300.0000 mg | ORAL_TABLET | Freq: Every day | ORAL | Status: DC
  Administered 2016-12-09: 300 mg via ORAL
  Filled 2016-12-09: qty 1

## 2016-12-09 NOTE — ED Notes (Signed)
Patient transported to X-ray 

## 2016-12-09 NOTE — Consult Note (Signed)
Reason for Consult: Chest pain Referring Physician: Dr. Tressia Miners hospitalist, Dr. Netty Starring primary Cardiologist: Eric Browning is an 66 y.o. male.  HPI: Patient is known history of diabetes hypertension obesity hyperlipidemia presented with acute onset of chest pain midsternal worse with movement as well as reproducible with palpation. Vein was progressive and severe 7 out of 10 . So came to emergency room he states with movement of his upper torso he has significant chest discomfort at times which is new no recent coughing or trauma  Past Medical History:  Diagnosis Date  . Diabetes ([place])   . Gout   . High cholesterol   . Hypertension     Past Surgical History:  Procedure Laterality Date  . COLONOSCOPY    . COLONOSCOPY WITH PROPOFOL N/A 08/03/2015   Procedure: COLONOSCOPY WITH PROPOFOL;  Surgeon: Manya Silvas, MD;  Location: [facility] ENDOSCOPY;  Service: Endoscopy;  Laterality: N/A;  . EUS N/A 09/03/2015   Procedure: LOWER ENDOSCOPIC ULTRASOUND (EUS);  Surgeon: Holly Bodily, MD;  Location: [facility] ENDOSCOPY;  Service: Endoscopy;  Laterality: N/A;  . right eye lens implant    . TONSILLECTOMY    . TOTAL KNEE ARTHROPLASTY Right 05/12/2016   Procedure: TOTAL KNEE ARTHROPLASTY;  Surgeon: Corky Mull, MD;  Location: [facility] ORS;  Service: Orthopedics;  Laterality: Right;    Family History  Problem Relation Age of Onset  . Heart disease Mother   . Stroke Father   . Hypertension Father   . Gout Father     Social History:  reports that he has quit smoking. He has never used smokeless tobacco. He reports that he does not drink alcohol or use drugs.  Allergies: No Known Allergies  Medications: I have reviewed the patient's current medications.  Results for orders placed or performed during the hospital encounter of 12/09/16 (from the past 48 hour(s))  Basic metabolic panel     Status: Abnormal   Collection Time: 12/09/16  4:25 AM  Result Value Ref Range   Sodium 141 135 -  145 mmol/L   Potassium 3.0 (L) 3.5 - 5.1 mmol/L   Chloride 103 101 - 111 mmol/L   CO2 32 22 - 32 mmol/L   Glucose, Bld 116 (H) 65 - 99 mg/dL   BUN 26 (H) 6 - 20 mg/dL   Creatinine, Ser 1.14 0.61 - 1.24 mg/dL   Calcium 10.4 (H) 8.9 - 10.3 mg/dL   GFR calc non Af Amer >60 >60 mL/min   GFR calc Af Amer >60 >60 mL/min    Comment: (NOTE) The eGFR has been calculated using the CKD EPI equation. This calculation has not been validated in all clinical situations. eGFR's persistently <60 mL/min signify possible Chronic Kidney Disease.    Anion gap 6 5 - 15  CBC     Status: Abnormal   Collection Time: 12/09/16  4:25 AM  Result Value Ref Range   WBC 10.1 3.8 - 10.6 K/uL   RBC 3.91 (L) 4.40 - 5.90 MIL/uL   Hemoglobin 11.1 (L) 13.0 - 18.0 g/dL   HCT 33.5 (L) 40.0 - 52.0 %   MCV 85.6 80.0 - 100.0 fL   MCH 28.5 26.0 - 34.0 pg   MCHC 33.3 32.0 - 36.0 g/dL   RDW 15.3 (H) 11.5 - 14.5 %   Platelets 331 150 - 440 K/uL  Troponin I     Status: None   Collection Time: 12/09/16  4:25 AM  Result Value Ref Range   Troponin I <  0.03 <0.03 ng/mL    Dg Chest 2 View  Result Date: 12/09/2016 CLINICAL DATA:  Acute onset of midsternal chest pain. Initial encounter. EXAM: CHEST  2 VIEW COMPARISON:  Chest radiograph from 05/04/2016 FINDINGS: The lungs are well-aerated. Mild left basilar atelectasis or scarring is noted. There is no evidence of pleural effusion or pneumothorax. The heart is borderline normal in size. No acute osseous abnormalities are seen. IMPRESSION: Mild left basilar atelectasis or scarring noted. Electronically Signed   By: Garald Balding M.D.   On: 12/09/2016 04:40    Review of Systems  Constitutional: Positive for malaise/fatigue.  Eyes: Negative.   Respiratory: Positive for shortness of breath.   Cardiovascular: Positive for chest pain.  Gastrointestinal: Negative.   Genitourinary: Negative.   Musculoskeletal: Negative.   Skin: Negative.   Neurological: Positive for weakness.   Endo/Heme/Allergies: Negative.   Psychiatric/Behavioral: Negative.    Blood pressure (!) 149/69, pulse 73, temperature 98.2 F (36.8 C), temperature source Oral, resp. rate 17, height _0  (1.702 m), weight 93.4 kg (205 lb 14.4 oz), SpO2 94 %. Physical Exam  Nursing note and vitals reviewed. Constitutional: He is oriented to person, place, and time. He appears well-developed and well-nourished.  HENT:  Head: Normocephalic and atraumatic.  Eyes: Conjunctivae and EOM are normal. Pupils are equal, round, and reactive to light.  Neck: Normal range of motion. Neck supple.  Cardiovascular: Normal rate and regular rhythm.   Respiratory: Effort normal and breath sounds normal.  GI: Soft. Bowel sounds are normal.  Musculoskeletal: Normal range of motion.  Neurological: He is alert and oriented to person, place, and time. He has normal reflexes.  Skin: Skin is warm and dry.  Psychiatric: He has a normal mood and affect.    Assessment/Plan: Chest pain Atypical chest wall tenderness Costochondritis Hypertension Diabetes Hyperlipidemia Gout Obesity Hypokalemia . Plan Agree with admit for rule out for myocardial infarction Recommend pain control with NSAIDs Myoview study for possible ischemia Consider echocardiogram can be done as an outpatient Do not recommend invasive strategy unless Myoview abnormal Recommend weight loss exercise portion control Continue diabetes management Agree with allopurinol colchicine for gout Recommend outpatient evaluation for follow-up Hypokalemia recommend replacement  [name] 12/09/2016, 12:21 PM

## 2016-12-09 NOTE — ED Provider Notes (Signed)
[facility] Emergency Department Provider Note   ____________________________________________   First MD Initiated Contact with Patient 12/09/16 [phone]     (approximate)  I have reviewed the triage vital signs and the nursing notes.   HISTORY  Chief Complaint Chest Pain    HPI Eric Browning is a 66 y.o. male who presents to the ED from home with a chief complaint of chest pain. Patient has a history ofnon-insulin-dependent type 2 diabetes, hypertension, hyperlipidemia who awoke with substernal chest tightness approximately 2 hours prior to arrival. Pain is not radiating and associated with shortness of breath. Denies associated diaphoresis, nausea/vomiting, palpitations, dizziness. Denies recent fever, chills, cough, congestion, abdominal pain, diarrhea. Denies recent travel or trauma. Nothing makes his symptoms better or worse.   Past Medical History:  Diagnosis Date  . Diabetes ([place])   . Gout   . High cholesterol   . Hypertension     Patient Active Problem List   Diagnosis Date Noted  . 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 COLONOSCOPY    . COLONOSCOPY WITH PROPOFOL N/A 08/03/2015   Procedure: COLONOSCOPY WITH PROPOFOL;  Surgeon: Manya Silvas, MD;  Location: [facility] ENDOSCOPY;  Service: Endoscopy;  Laterality: N/A;  . EUS N/A 09/03/2015   Procedure: LOWER ENDOSCOPIC ULTRASOUND (EUS);  Surgeon: Holly Bodily, MD;  Location: [facility] ENDOSCOPY;  Service: Endoscopy;  Laterality: N/A;  . right eye lens implant    . TONSILLECTOMY    . TOTAL KNEE ARTHROPLASTY Right 05/12/2016   Procedure: TOTAL KNEE ARTHROPLASTY;  Surgeon: Corky Mull, MD;  Location: [facility] ORS;  Service: Orthopedics;  Laterality: Right;    Prior to Admission medications   Medication Sig Start Date End Date Taking? Authorizing Provider  allopurinol (ZYLOPRIM) 300 MG tablet Take 300 mg by mouth daily.    Yes Historical Provider, MD  colchicine 0.6 MG tablet Take 0.6 mg by mouth daily as needed. For gout flare-ups   Yes Historical Provider, MD  etodolac (LODINE) 200 MG capsule Take 200 mg by mouth every 8 (eight) hours.   Yes Historical Provider, MD  metFORMIN (GLUCOPHAGE) 500 MG tablet Take 500 mg by mouth 2 (two) times daily.    Yes Historical Provider, MD  metoprolol tartrate (LOPRESSOR) 25 MG tablet Take 25 mg by mouth 2 (two) times daily.   Yes Historical Provider, MD  Olmesartan-Amlodipine-HCTZ (TRIBENZOR) 40-10-25 MG TABS Take 1 tablet by mouth daily.    Yes Historical Provider, MD  potassium chloride SA (K-DUR,KLOR-CON) 20 MEQ tablet Take 20 mEq by mouth 3 (three) times daily.    Yes Historical Provider, MD  pravastatin (PRAVACHOL) 20 MG tablet Take 20 mg by mouth daily.   Yes Historical Provider, MD    Allergies Patient has no known allergies.  Family History  Problem Relation Age of Onset  . Heart disease Mother   . Stroke Father   . Hypertension Father   . Gout Father     Social History Social History  Substance Use Topics  . Smoking status: Former Research scientist (life sciences)  . Smokeless tobacco: Never Used     Comment: quit 1990  . Alcohol use No    Review of Systems  Constitutional: No fever/chills. Eyes: No visual changes. ENT: No sore throat. Cardiovascular: Positive for chest pain. Respiratory: Positive for shortness of breath. Gastrointestinal: No abdominal pain.  No nausea, no vomiting.  No diarrhea.  No constipation. Genitourinary: Negative for  dysuria. Musculoskeletal: Negative for back pain. Skin: Negative for rash. Neurological: Negative for headaches, focal weakness or numbness.  10-point ROS otherwise negative.  ____________________________________________   PHYSICAL EXAM:  VITAL SIGNS: ED Triage Vitals  Enc Vitals Group     BP 12/09/16 0422 (!) 172/91     Pulse Rate 12/09/16 0422 81     Resp 12/09/16 0422 18     Temp 12/09/16 0422 97.6 F (36.4 C)      Temp Source 12/09/16 0422 Oral     SpO2 12/09/16 0422 99 %     Weight 12/09/16 0425 205 lb (93 kg)     Height 12/09/16 0425 5\' 7"  (1.702 m)     Head Circumference --      Peak Flow --      Pain Score 12/09/16 0422 5     Pain Loc --      Pain Edu? --      Excl. in [place]? --     Constitutional: Alert and oriented. Well appearing and in no acute distress. Eyes: Conjunctivae are normal. PERRL. EOMI. Head: Atraumatic. Nose: No congestion/rhinnorhea. Mouth/Throat: Mucous membranes are moist.  Oropharynx non-erythematous. Neck: No stridor.   Cardiovascular: Normal rate, regular rhythm. Grossly normal heart sounds.  Good peripheral circulation. Respiratory: Normal respiratory effort.  No retractions. Lungs CTAB. Gastrointestinal: Soft and nontender. No distention. No abdominal bruits. No CVA tenderness. Musculoskeletal: No lower extremity tenderness nor edema.  No joint effusions. Neurologic:  Normal speech and language. No gross focal neurologic deficits are appreciated. No gait instability. Skin:  Skin is warm, dry and intact. No rash noted. Psychiatric: Mood and affect are normal. Speech and behavior are normal.  ____________________________________________   LABS (all labs ordered are listed, but only abnormal results are displayed)  Labs Reviewed  BASIC METABOLIC PANEL - Abnormal; Notable for the following:       Result Value   Potassium 3.0 (*)    Glucose, Bld 116 (*)    BUN 26 (*)    Calcium 10.4 (*)    All other components within normal limits  CBC - Abnormal; Notable for the following:    RBC 3.91 (*)    Hemoglobin 11.1 (*)    HCT 33.5 (*)    RDW 15.3 (*)    All other components within normal limits  TROPONIN I   ____________________________________________  EKG  ED ECG REPORT I, Melinda Gwinner J, the attending physician, personally viewed and interpreted this ECG.   Date: 12/09/2016  EKG Time: 0426  Rate: 79  Rhythm: normal EKG, normal sinus rhythm  Axis: Normal   Intervals:none  ST&T Change: Nonspecific  ____________________________________________  RADIOLOGY  Chest x-ray interpreted per Dr. Radene Knee: Mild left basilar atelectasis or scarring noted. ____________________________________________   PROCEDURES  Procedure(s) performed: None  Procedures  Critical Care performed: Yes, see critical care note(s)  CRITICAL CARE Performed by: Paulette Blanch   Total critical care time: 30 minutes  Critical care time was exclusive of separately billable procedures and treating other patients.  Critical care was necessary to treat or prevent imminent or life-threatening deterioration.  Critical care was time spent personally by me on the following activities: development of treatment plan with patient and/or surrogate as well as nursing, discussions with consultants, evaluation of patient's response to treatment, examination of patient, obtaining history from patient or surrogate, ordering and performing treatments and interventions, ordering and review of laboratory studies, ordering and review of radiographic studies, pulse oximetry and re-evaluation of patient's condition. ____________________________________________  INITIAL IMPRESSION / ASSESSMENT AND PLAN / ED COURSE  Pertinent labs & imaging results that were available during my care of the patient were reviewed by me and considered in my medical decision making (see chart for details).  66 year old male with a history of diabetes, hypertension, hyperlipidemia who presents with chest pain. Initial EKG is unremarkable. Will initiate aspirin therapy, nitroglycerin and reassess.      ____________________________________________   FINAL CLINICAL IMPRESSION(S) / ED DIAGNOSES  Final diagnoses:  Chest pain, unspecified type  Unstable angina (HCC)  Essential hypertension      NEW MEDICATIONS STARTED DURING THIS VISIT:  New Prescriptions   No medications on file     Note:  This  document was prepared using Dragon voice recognition software and may include unintentional dictation errors.    Paulette Blanch, MD 12/09/16 [phone]

## 2016-12-09 NOTE — Progress Notes (Signed)
     Eric Browning was admitted to the [facility] on 12/09/2016 for an acute medical condition and is being Discharged on  12/09/2016 . He  will need another 3-4 days for recovery and so advised to stay away from work until then. So please excuse him  from work for the above Days. Should be able to return to work/school without any restrictions from 12/13/16.  Call [name]  MD, [facility] at  [phone] with questions.  [name] M.D on 12/09/2016,at 1:11 PM  [facility] [address], [place] [address]

## 2016-12-09 NOTE — Progress Notes (Signed)
To nuclear medicine via bed 

## 2016-12-09 NOTE — Progress Notes (Signed)
Pt discharged to home via wc.  Instructions and rx given to pt.  Questions answered.  No distress.  

## 2016-12-09 NOTE — Discharge Summary (Signed)
[facility] [place] NAME: Eric Browning    MR#:  [id number]  DATE OF BIRTH:  11/18/1950  DATE OF ADMISSION:  12/09/2016   ADMITTING PHYSICIAN: Saundra Shelling, MD  DATE OF DISCHARGE: 12/09/2016  2:20 PM  PRIMARY CARE PHYSICIAN: Dion Body, MD   ADMISSION DIAGNOSIS:   Unstable angina ([place]) [I20.0] Essential hypertension [I10] Chest pain [R07.9] Chest pain, unspecified type [R07.9]  DISCHARGE DIAGNOSIS:   Active Problems:   Chest pain   SECONDARY DIAGNOSIS:   Past Medical History:  Diagnosis Date  . Diabetes ([place])   . Gout   . High cholesterol   . Hypertension     HOSPITAL COURSE:   66 year old male with past medical history significant for hypertension, hyperlipidemia, gout and diabetes mellitus admitted to hospital secondary to chest pain.  #1 chest pain-costochondritis and also musculoskeletal chest pain. -Patient has to lift heavy stuff as part of his work. Significant tenderness at the costochondral junction. -however due to his risk factors, stress test was done and it did not show any reversible defect. -Appreciate cardiology consult in the hospital. Patient already takes colchicine and NSAIDS due to his gout. Being discharged on some Flexeril and tramadol as needed.  #2 gout-stable. Continue home medications  #3 hypertension-patient on metoprolol. Also on Tribenzor  #4 diabetes-on metformin.  #5 chronic hypokalemia-continue potassium supplements.  #6 hyperlipidemia-on statin.  Patient is stable and is being discharged today  DISCHARGE CONDITIONS:   Stable  CONSULTS OBTAINED:   Treatment Team:  Yolonda Kida, MD  DRUG ALLERGIES:   No Known Allergies DISCHARGE MEDICATIONS:   Allergies as of 12/09/2016   No Known Allergies     Medication List    TAKE these medications   allopurinol 300 MG tablet Commonly known as:  ZYLOPRIM Take 300 mg by mouth daily.   aspirin 81 MG EC  tablet Take 1 tablet (81 mg total) by mouth daily. Start taking on:  12/10/2016   colchicine 0.6 MG tablet Take 0.6 mg by mouth daily as needed. For gout flare-ups   cyclobenzaprine 10 MG tablet Commonly known as:  FLEXERIL Take 1 tablet (10 mg total) by mouth 3 (three) times daily as needed for muscle spasms.   etodolac 200 MG capsule Commonly known as:  LODINE Take 200 mg by mouth every 8 (eight) hours.   metFORMIN 500 MG tablet Commonly known as:  GLUCOPHAGE Take 500 mg by mouth 2 (two) times daily.   metoprolol tartrate 25 MG tablet Commonly known as:  LOPRESSOR Take 25 mg by mouth 2 (two) times daily.   potassium chloride SA 20 MEQ tablet Commonly known as:  K-DUR,KLOR-CON Take 20 mEq by mouth 3 (three) times daily.   pravastatin 20 MG tablet Commonly known as:  PRAVACHOL Take 20 mg by mouth daily.   traMADol 50 MG tablet Commonly known as:  ULTRAM Take 1 tablet (50 mg total) by mouth every 6 (six) hours as needed for moderate pain or severe pain.   TRIBENZOR 40-10-25 MG Tabs Generic drug:  Olmesartan-Amlodipine-HCTZ Take 1 tablet by mouth daily.        DISCHARGE INSTRUCTIONS:   1. PCP f/u in 1-2 weeks 2. Cardiology f/u in 2 weeks  DIET:   Cardiac diet  ACTIVITY:   Activity as tolerated  OXYGEN:   Home Oxygen: No.  Oxygen Delivery: room air  DISCHARGE LOCATION:   home   If you experience worsening of your admission symptoms, develop shortness of  breath, life threatening emergency, suicidal or homicidal thoughts you must seek medical attention immediately by calling 911 or calling your MD immediately  if symptoms less severe.  You Must read complete instructions/literature along with all the possible adverse reactions/side effects for all the Medicines you take and that have been prescribed to you. Take any new Medicines after you have completely understood and accpet all the possible adverse reactions/side effects.   Please note  You were  cared for by a hospitalist during your hospital stay. If you have any questions about your discharge medications or the care you received while you were in the hospital after you are discharged, you can call the unit and asked to speak with the hospitalist on call if the hospitalist that took care of you is not available. Once you are discharged, your primary care physician will handle any further medical issues. Please note that NO REFILLS for any discharge medications will be authorized once you are discharged, as it is imperative that you return to your primary care physician (or establish a relationship with a primary care physician if you do not have one) for your aftercare needs so that they can reassess your need for medications and monitor your lab values.    On the day of Discharge:  VITAL SIGNS:   Blood pressure (!) 149/69, pulse 73, temperature 98.2 F (36.8 C), temperature source Oral, resp. rate 17, height 5\' 7"  (1.702 m), weight 93.4 kg (205 lb 14.4 oz), SpO2 94 %.  PHYSICAL EXAMINATION:    GENERAL:  66 y.o.-year-old patient lying in the bed with no acute distress.  EYES: Pupils equal, round, reactive to light and accommodation. No scleral icterus. Extraocular muscles intact.  HEENT: Head atraumatic, normocephalic. Oropharynx and nasopharynx clear.  NECK:  Supple, no jugular venous distention. No thyroid enlargement, no tenderness.  LUNGS: Normal breath sounds bilaterally, no wheezing, rales,rhonchi or crepitation. No use of accessory muscles of respiration.  CARDIOVASCULAR: S1, S2 normal. No murmurs, rubs, or gallops. Tenderness noted to touch in the costochondral junction. ABDOMEN: Soft, non-tender, non-distended. Bowel sounds present. No organomegaly or mass.  EXTREMITIES: No pedal edema, cyanosis, or clubbing.  NEUROLOGIC: Cranial nerves II through XII are intact. Muscle strength 5/5 in all extremities. Sensation intact. Gait not checked.  PSYCHIATRIC: The patient is alert and  oriented x 3.  SKIN: No obvious rash, lesion, or ulcer.   DATA REVIEW:   CBC  Recent Labs Lab 12/09/16 0425  WBC 10.1  HGB 11.1*  HCT 33.5*  PLT 331    Chemistries   Recent Labs Lab 12/09/16 0425  NA 141  K 3.0*  CL 103  CO2 32  GLUCOSE 116*  BUN 26*  CREATININE 1.14  CALCIUM 10.4*     Microbiology Results  Results for orders placed or performed during the hospital encounter of 05/04/16  Surgical pcr screen     Status: None   Collection Time: 05/04/16  1:48 PM  Result Value Ref Range Status   MRSA, PCR NEGATIVE NEGATIVE Final   Staphylococcus aureus NEGATIVE NEGATIVE Final    Comment:        The Xpert SA Assay (FDA approved for NASAL specimens in patients over 5 years of age), is one component of a comprehensive surveillance program.  Test performance has been validated by [facility] for patients greater than or equal to 30 year old. It is not intended to diagnose infection nor to guide or monitor treatment.     RADIOLOGY:  Dg Chest  2 View  Result Date: 12/09/2016 CLINICAL DATA:  Acute onset of midsternal chest pain. Initial encounter. EXAM: CHEST  2 VIEW COMPARISON:  Chest radiograph from 05/04/2016 FINDINGS: The lungs are well-aerated. Mild left basilar atelectasis or scarring is noted. There is no evidence of pleural effusion or pneumothorax. The heart is borderline normal in size. No acute osseous abnormalities are seen. IMPRESSION: Mild left basilar atelectasis or scarring noted. Electronically Signed   By: Garald Balding M.D.   On: 12/09/2016 04:40     Management plans discussed with the patient, family and they are in agreement.  CODE STATUS:     Code Status Orders        Start     Ordered   12/09/16 0634  Full code  Continuous     12/09/16 0633    Code Status History    Date Active Date Inactive Code Status Order ID Comments User Context   05/12/2016  1:05 PM 05/14/2016  3:13 PM Full Code [id number]  Corky Mull, MD Inpatient       TOTAL TIME TAKING CARE OF THIS PATIENT: 37 minutes.    [name] M.D on 12/09/2016 at 2:41 PM  Between 7am to 6pm - Pager - [phone]  After 6pm go to [email or web address] - Proofreader  [facility]  Office  [phone]  CC: Primary care physician; Dion Body, MD   Note: This dictation was prepared with Dragon dictation along with smaller phrase technology. Any transcriptional errors that result from this process are unintentional.

## 2016-12-09 NOTE — H&P (Signed)
[facility] NAME: [name]    MR#:  [id number]  DATE OF BIRTH:  Apr 13, 1951  DATE OF ADMISSION:  12/09/2016  PRIMARY CARE PHYSICIAN: Dion Body, MD   REQUESTING/REFERRING PHYSICIAN:   CHIEF COMPLAINT:   Chief Complaint  Patient presents with  . Chest Pain    HISTORY OF PRESENT ILLNESS: Eric Browning  is a 66 y.o. male with a known history of Diabetes mellitus, gout, hyperlipidemia, hypertension presented to the emergency room with chest pain. Patient woke up from sleep with chest pain. Pain is located in the retrosternal area and is 7 out of 10 on a scale of 1-10. Discharge in nature. Patient follows up with cardiology clinic cardiology. Patient says he had a cardiac stress test more than 2 years ago. No complaints of any shortness of breath, orthopnea or proximal nocturnal dyspnea. No complaints of any palpitations. EKG in the emergency room normal sinus rhythm with no ST segment elevation. No fever or chills and cough. First set of troponin was negative. Hospitalist service was consulted for further care of the patient.  PAST MEDICAL HISTORY:   Past Medical History:  Diagnosis Date  . Diabetes ([place])   . Gout   . High cholesterol   . Hypertension     PAST SURGICAL HISTORY: Past Surgical History:  Procedure Laterality Date  . COLONOSCOPY    . COLONOSCOPY WITH PROPOFOL N/A 08/03/2015   Procedure: COLONOSCOPY WITH PROPOFOL;  Surgeon: Manya Silvas, MD;  Location: [facility] ENDOSCOPY;  Service: Endoscopy;  Laterality: N/A;  . EUS N/A 09/03/2015   Procedure: LOWER ENDOSCOPIC ULTRASOUND (EUS);  Surgeon: Holly Bodily, MD;  Location: [facility] ENDOSCOPY;  Service: Endoscopy;  Laterality: N/A;  . right eye lens implant    . TONSILLECTOMY    . TOTAL KNEE ARTHROPLASTY Right 05/12/2016   Procedure: TOTAL KNEE ARTHROPLASTY;  Surgeon: Corky Mull, MD;  Location: [facility] ORS;  Service: Orthopedics;  Laterality: Right;    SOCIAL  HISTORY:  Social History  Substance Use Topics  . Smoking status: Former Research scientist (life sciences)  . Smokeless tobacco: Never Used     Comment: quit 1990  . Alcohol use No    FAMILY HISTORY:  Family History  Problem Relation Age of Onset  . Heart disease Mother   . Stroke Father   . Hypertension Father   . Gout Father     DRUG ALLERGIES: No Known Allergies  REVIEW OF SYSTEMS:   CONSTITUTIONAL: No fever, fatigue or weakness.  EYES: No blurred or double vision.  EARS, NOSE, AND THROAT: No tinnitus or ear pain.  RESPIRATORY: No cough, shortness of breath, wheezing or hemoptysis.  CARDIOVASCULAR: Has chest pain,  No orthopnea, edema.  GASTROINTESTINAL: No nausea, vomiting, diarrhea or abdominal pain.  GENITOURINARY: No dysuria, hematuria.  ENDOCRINE: No polyuria, nocturia,  HEMATOLOGY: No anemia, easy bruising or bleeding SKIN: No rash or lesion. MUSCULOSKELETAL: No joint pain or arthritis.   NEUROLOGIC: No tingling, numbness, weakness.  PSYCHIATRY: No anxiety or depression.   MEDICATIONS AT HOME:  Prior to Admission medications   Medication Sig Start Date End Date Taking? Authorizing Provider  allopurinol (ZYLOPRIM) 300 MG tablet Take 300 mg by mouth daily.   Yes Historical Provider, MD  colchicine 0.6 MG tablet Take 0.6 mg by mouth daily as needed. For gout flare-ups   Yes Historical Provider, MD  etodolac (LODINE) 200 MG capsule Take 200 mg by mouth every 8 (eight) hours.   Yes Historical  Provider, MD  metFORMIN (GLUCOPHAGE) 500 MG tablet Take 500 mg by mouth 2 (two) times daily.    Yes Historical Provider, MD  metoprolol tartrate (LOPRESSOR) 25 MG tablet Take 25 mg by mouth 2 (two) times daily.   Yes Historical Provider, MD  Olmesartan-Amlodipine-HCTZ (TRIBENZOR) 40-10-25 MG TABS Take 1 tablet by mouth daily.    Yes Historical Provider, MD  potassium chloride SA (K-DUR,KLOR-CON) 20 MEQ tablet Take 20 mEq by mouth 3 (three) times daily.    Yes Historical Provider, MD  pravastatin  (PRAVACHOL) 20 MG tablet Take 20 mg by mouth daily.   Yes Historical Provider, MD      PHYSICAL EXAMINATION:   VITAL SIGNS: Blood pressure (!) 183/88, pulse 72, temperature 97.6 F (36.4 C), temperature source Oral, resp. rate 16, height 5\' 7"  (1.702 m), weight 93 kg (205 lb), SpO2 97 %.  GENERAL:  66 y.o.-year-old patient lying in the bed with no acute distress.  EYES: Pupils equal, round, reactive to light and accommodation. No scleral icterus. Extraocular muscles intact.  HEENT: Head atraumatic, normocephalic. Oropharynx and nasopharynx clear.  NECK:  Supple, no jugular venous distention. No thyroid enlargement, no tenderness.  LUNGS: Normal breath sounds bilaterally, no wheezing, rales,rhonchi or crepitation. No use of accessory muscles of respiration.  CARDIOVASCULAR: S1, S2 normal. No murmurs, rubs, or gallops.  ABDOMEN: Soft, nontender, nondistended. Bowel sounds present. No organomegaly or mass.  EXTREMITIES: No pedal edema, cyanosis, or clubbing.  NEUROLOGIC: Cranial nerves II through XII are intact. Muscle strength 5/5 in all extremities. Sensation intact. Gait not checked.  PSYCHIATRIC: The patient is alert and oriented x 3.  SKIN: No obvious rash, lesion, or ulcer.   LABORATORY PANEL:   CBC  Recent Labs Lab 12/09/16 0425  WBC 10.1  HGB 11.1*  HCT 33.5*  PLT 331  MCV 85.6  MCH 28.5  MCHC 33.3  RDW 15.3*   ------------------------------------------------------------------------------------------------------------------  Chemistries   Recent Labs Lab 12/09/16 0425  NA 141  K 3.0*  CL 103  CO2 32  GLUCOSE 116*  BUN 26*  CREATININE 1.14  CALCIUM 10.4*   ------------------------------------------------------------------------------------------------------------------ estimated creatinine clearance is 67.5 mL/min (by C-G formula based on SCr of 1.14  mg/dL). ------------------------------------------------------------------------------------------------------------------ No results for input(s): TSH, T4TOTAL, T3FREE, THYROIDAB in the last 72 hours.  Invalid input(s): FREET3   Coagulation profile No results for input(s): INR, PROTIME in the last 168 hours. ------------------------------------------------------------------------------------------------------------------- No results for input(s): DDIMER in the last 72 hours. -------------------------------------------------------------------------------------------------------------------  Cardiac Enzymes  Recent Labs Lab 12/09/16 0425  TROPONINI <0.03   ------------------------------------------------------------------------------------------------------------------ Invalid input(s): POCBNP  ---------------------------------------------------------------------------------------------------------------  Urinalysis    Component Value Date/Time   COLORURINE YELLOW (A) 05/04/2016 1348   APPEARANCEUR CLEAR (A) 05/04/2016 1348   LABSPEC 1.019 05/04/2016 1348   PHURINE 5.0 05/04/2016 1348   GLUCOSEU NEGATIVE 05/04/2016 1348   HGBUR NEGATIVE 05/04/2016 1348   BILIRUBINUR NEGATIVE 05/04/2016 1348   KETONESUR NEGATIVE 05/04/2016 1348   PROTEINUR 30 (A) 05/04/2016 1348   NITRITE NEGATIVE 05/04/2016 1348   LEUKOCYTESUR NEGATIVE 05/04/2016 1348     RADIOLOGY: Dg Chest 2 View  Result Date: 12/09/2016 CLINICAL DATA:  Acute onset of midsternal chest pain. Initial encounter. EXAM: CHEST  2 VIEW COMPARISON:  Chest radiograph from 05/04/2016 FINDINGS: The lungs are well-aerated. Mild left basilar atelectasis or scarring is noted. There is no evidence of pleural effusion or pneumothorax. The heart is borderline normal in size. No acute osseous abnormalities are seen. IMPRESSION: Mild left basilar atelectasis or scarring noted. Electronically  Signed   By: Garald Balding M.D.   On:  12/09/2016 04:40    EKG: Orders placed or performed during the hospital encounter of 12/09/16  . ED EKG within 10 minutes  . ED EKG within 10 minutes  . EKG 12-Lead  . EKG 12-Lead    IMPRESSION AND PLAN: 66 year old male patient with history of diabetes mellitus, hypertension, gout, hyperlipidemia presented to the emergency room with chest pain. Admitting diagnosis 1. Chest pain 2. Uncontrolled hypertension 3. Hypokalemia 4. Type 2 diabetes mellitus Treatment plan Admit patient to telemetry observation bed Aspirin 81 mg daily Control blood pressure with oral hypertensive medications Replace potassium Cycle troponin check for ischemia check echocardiogram Cardiac stress test CODE STATUS full code   All the records are reviewed and case discussed with ED provider. Management plans discussed with the patient, family and they are in agreement.  CODE STATUS:FULL CODE Surrogate decision maker : spouse Code Status History    Date Active Date Inactive Code Status Order ID Comments User Context   05/12/2016  1:05 PM 05/14/2016  3:13 PM Full Code [id number]  Corky Mull, MD Inpatient       TOTAL TIME TAKING CARE OF THIS PATIENT: 50 minutes.    Saundra Shelling M.D on 12/09/2016 at 5:32 AM  Between 7am to 6pm - Pager - [phone]  After 6pm go to [email or web address] - password [email or web address] [place] [facility]  Office  [phone]  CC: Primary care physician; Dion Body, MD

## 2016-12-09 NOTE — Plan of Care (Signed)
Problem: Activity: Goal: Ability to tolerate increased activity will improve Outcome: Progressing Stress test today and 2Decho  Problem: Pain Managment: Goal: General experience of comfort will improve Outcome: Not Progressing Prn medications  Problem: Tissue Perfusion: Goal: Risk factors for ineffective tissue perfusion will decrease Outcome: Progressing SQ Lovenox

## 2017-02-17 ENCOUNTER — Encounter: Payer: Self-pay | Admitting: Medical Oncology

## 2017-02-17 ENCOUNTER — Emergency Department
Admission: EM | Admit: 2017-02-17 | Discharge: 2017-02-17 | Disposition: A | Discharge status: Home / Self Care | Payer: Managed Care, Other (non HMO) | Attending: Emergency Medicine | Admitting: Emergency Medicine

## 2017-02-17 DIAGNOSIS — Z79899 Other long term (current) drug therapy: Secondary | ICD-10-CM | POA: Insufficient documentation

## 2017-02-17 DIAGNOSIS — Z87891 Personal history of nicotine dependence: Secondary | ICD-10-CM | POA: Insufficient documentation

## 2017-02-17 DIAGNOSIS — Z7982 Long term (current) use of aspirin: Secondary | ICD-10-CM | POA: Diagnosis not present

## 2017-02-17 DIAGNOSIS — E119 Type 2 diabetes mellitus without complications: Secondary | ICD-10-CM | POA: Insufficient documentation

## 2017-02-17 DIAGNOSIS — I10 Essential (primary) hypertension: Secondary | ICD-10-CM | POA: Diagnosis not present

## 2017-02-17 DIAGNOSIS — M436 Torticollis: Secondary | ICD-10-CM

## 2017-02-17 DIAGNOSIS — Z7984 Long term (current) use of oral hypoglycemic drugs: Secondary | ICD-10-CM | POA: Diagnosis not present

## 2017-02-17 DIAGNOSIS — Z96651 Presence of right artificial knee joint: Secondary | ICD-10-CM | POA: Insufficient documentation

## 2017-02-17 DIAGNOSIS — M542 Cervicalgia: Secondary | ICD-10-CM | POA: Diagnosis present

## 2017-02-17 MED ORDER — KETOROLAC TROMETHAMINE 60 MG/2ML IM SOLN
60.0000 mg | Freq: Once | INTRAMUSCULAR | Status: AC
  Administered 2017-02-17: 60 mg via INTRAMUSCULAR
  Filled 2017-02-17: qty 2

## 2017-02-17 MED ORDER — ORPHENADRINE CITRATE 30 MG/ML IJ SOLN
60.0000 mg | Freq: Two times a day (BID) | INTRAMUSCULAR | Status: DC
  Administered 2017-02-17: 60 mg via INTRAMUSCULAR
  Filled 2017-02-17: qty 2

## 2017-02-17 MED ORDER — ORPHENADRINE CITRATE ER 100 MG PO TB12: 100.0000 mg | ORAL_TABLET | Freq: Two times a day (BID) | ORAL | 0 refills | Status: DC

## 2017-02-17 MED ORDER — KETOROLAC TROMETHAMINE 10 MG PO TABS: 10.0000 mg | ORAL_TABLET | Freq: Four times a day (QID) | ORAL | 0 refills | Status: DC | PRN

## 2017-02-17 NOTE — ED Notes (Signed)
See triage note  States he woke up last Friday with pain to neck  Denies any specific injury    No fever  Pain increases with movement  But is able to move neck freeely

## 2017-02-17 NOTE — ED Provider Notes (Signed)
[facility] Emergency Department Provider Note    ____________________________________________   First MD Initiated Contact with Patient 02/17/17 0745     (approximate)  I have reviewed the triage vital signs and the nursing notes.   HISTORY  Chief Complaint Neck Pain    HPI Eric Browning is a 66 y.o. male patient complaining of neck pain for 1 week. Patient stated he awakened with neck pain. Patient state after 3 days of neck pain went to his PCP and was given Flexeril. Patient stated no relief with this medication. Patient denies any radicular component to his neck pain.Patient rates his pain as a 9/10. Patient describes the pain as "achy". Patient stated pain increases with lateral movements of his neck.   Past Medical History:  Diagnosis Date  . Diabetes ([place])   . Gout   . High cholesterol   . Hypertension     Patient Active Problem List   Diagnosis Date Noted  . Chest pain 12/09/2016  . 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 COLONOSCOPY    . COLONOSCOPY WITH PROPOFOL N/A 08/03/2015   Procedure: COLONOSCOPY WITH PROPOFOL;  Surgeon: Manya Silvas, MD;  [facility] ENDOSCOPY;  Service: Endoscopy;  Laterality: N/A;  . EUS N/A 09/03/2015   Procedure: LOWER ENDOSCOPIC ULTRASOUND (EUS);  Surgeon: Holly Bodily, MD;  Location: [facility] ENDOSCOPY;  Service: Endoscopy;  Laterality: N/A;  . right eye lens implant    . TONSILLECTOMY    . TOTAL KNEE ARTHROPLASTY Right 05/12/2016   Procedure: TOTAL KNEE ARTHROPLASTY;  Surgeon: Corky Mull, MD;  Location: [facility] ORS;  Service: Orthopedics;  Laterality: Right;    Prior to Admission medications   Medication Sig Start Date End Date Taking? Authorizing Provider  allopurinol (ZYLOPRIM) 300 MG tablet Take 300 mg by mouth daily.    [provider]  aspirin EC 81 MG EC tablet Take 1 tablet (81 mg total) by mouth  daily. 12/10/16   [name], MD  colchicine 0.6 MG tablet Take 0.6 mg by mouth daily as needed. For gout flare-ups    [provider]  cyclobenzaprine (FLEXERIL) 10 MG tablet Take 1 tablet (10 mg total) by mouth 3 (three) times daily as needed for muscle spasms. 12/09/16   [name], MD  etodolac (LODINE) 200 MG capsule Take 200 mg by mouth every 8 (eight) hours.    [provider]  ketorolac (TORADOL) 10 MG tablet Take 1 tablet (10 mg total) by mouth every 6 (six) hours as needed. 02/17/17   Sable Feil, PA-C  metFORMIN (GLUCOPHAGE) 500 MG tablet Take 500 mg by mouth 2 (two) times daily.     [provider]  metoprolol tartrate (LOPRESSOR) 25 MG tablet Take 25 mg by mouth 2 (two) times daily.    [provider]  Olmesartan-Amlodipine-HCTZ (TRIBENZOR) 40-10-25 MG TABS Take 1 tablet by mouth daily.     [provider]  orphenadrine (NORFLEX) 100 MG tablet Take 1 tablet (100 mg total) by mouth 2 (two) times daily. 02/17/17   Sable Feil, PA-C  potassium chloride SA (K-DUR,KLOR-CON) 20 MEQ tablet Take 20 mEq by mouth 3 (three) times daily.     [provider]  pravastatin (PRAVACHOL) 20 MG tablet Take 20 mg by mouth daily.    [provider]  traMADol (ULTRAM) 50 MG tablet Take 1 tablet (50 mg total) by mouth every 6 (  six) hours as needed for moderate pain or severe pain. 12/09/16   [name], MD    Allergies Patient has no known allergies.  Family History  Problem Relation Age of Onset  . Heart disease Mother   . Stroke Father   . Hypertension Father   . Gout Father     Social History Social History  Substance Use Topics  . Smoking status: Former Research scientist (life sciences)  . Smokeless tobacco: Never Used     Comment: quit 1990  . Alcohol use No    Review of Systems  Constitutional: No fever/chills Eyes: No visual changes. ENT: No sore throat. Cardiovascular: Denies chest pain. Respiratory: Denies shortness  of breath. Gastrointestinal: No abdominal pain.  No nausea, no vomiting.  No diarrhea.  No constipation. Genitourinary: Negative for dysuria. Musculoskeletal: Neck pain  Skin: Negative for rash. Neurological: Negative for headaches, focal weakness or numbness. Endocrine: Diabetes, hyperlipidemia and hypertension.  ____________________________________________   PHYSICAL EXAM:  VITAL SIGNS: ED Triage Vitals  Enc Vitals Group     BP 02/17/17 0728 (!) 166/77     Pulse Rate 02/17/17 0728 79     Resp 02/17/17 0728 16     Temp 02/17/17 0728 98.2 F (36.8 C)     Temp Source 02/17/17 0728 Oral     SpO2 02/17/17 0728 100 %     Weight 02/17/17 0726 205 lb (93 kg)     Height --      Head Circumference --      Peak Flow --      Pain Score 02/17/17 0725 9     Pain Loc --      Pain Edu? --      Excl. in [place]? --     Constitutional: Alert and oriented. Well appearing and in no acute distress.  non-erythematous. Neck: No stridor.  No cervical spine tenderness to palpation. Decreased range of motion with lateral movements. Hematological/Lymphatic/Immunilogical: No cervical lymphadenopathy. Cardiovascular: Normal rate, regular rhythm. Grossly normal heart sounds.  Good peripheral circulation. Respiratory: Normal respiratory effort.  No retractions. Lungs CTAB. Musculoskeletal: No lower extremity tenderness nor edema.  No joint effusions. Neurologic:  Normal speech and language. No gross focal neurologic deficits are appreciated. No gait instability. Skin:  Skin is warm, dry and intact. No rash noted. Psychiatric: Mood and affect are normal. Speech and behavior are normal.  ____________________________________________   LABS (all labs ordered are listed, but only abnormal results are displayed)  Labs Reviewed - No data to display ____________________________________________  EKG   ____________________________________________  RADIOLOGY  No results  found.  ____________________________________________   PROCEDURES  Procedure(s) performed: None  Procedures  Critical Care performed: No  ____________________________________________   INITIAL IMPRESSION / ASSESSMENT AND PLAN / ED COURSE  Pertinent labs & imaging results that were available during my care of the patient were reviewed by me and considered in my medical decision making (see chart for details).  Torticollis. Patient given discharge care instructions. Patient given injection of Norflex and Toradol prior to departure. Patient advised return back if no improvement and 2-3 days.      ____________________________________________   FINAL CLINICAL IMPRESSION(S) / ED DIAGNOSES  Final diagnoses:  Torticollis, acute      NEW MEDICATIONS STARTED DURING THIS VISIT:  New Prescriptions   KETOROLAC (TORADOL) 10 MG TABLET    Take 1 tablet (10 mg total) by mouth every 6 (six) hours as needed.   ORPHENADRINE (NORFLEX) 100 MG TABLET    Take 1 tablet (100  mg total) by mouth 2 (two) times daily.     Note:  This document was prepared using Dragon voice recognition software and may include unintentional dictation errors.    [place], [place], PA-C 02/17/17 [place], [place], MD 02/18/17 [phone]

## 2017-02-17 NOTE — ED Triage Notes (Signed)
Pt reports that he woke up last Friday morning with pain in his neck. Pt reports pain with movement since.

## 2017-03-03 ENCOUNTER — Emergency Department
Admission: EM | Admit: 2017-03-03 | Discharge: 2017-03-03 | Disposition: A | Discharge status: Home / Self Care | Payer: Managed Care, Other (non HMO) | Attending: Student in an Organized Health Care Education/Training Program | Admitting: Student in an Organized Health Care Education/Training Program

## 2017-03-03 DIAGNOSIS — Z87891 Personal history of nicotine dependence: Secondary | ICD-10-CM | POA: Diagnosis not present

## 2017-03-03 DIAGNOSIS — M436 Torticollis: Secondary | ICD-10-CM | POA: Insufficient documentation

## 2017-03-03 DIAGNOSIS — E119 Type 2 diabetes mellitus without complications: Secondary | ICD-10-CM | POA: Diagnosis not present

## 2017-03-03 DIAGNOSIS — Z79899 Other long term (current) drug therapy: Secondary | ICD-10-CM | POA: Insufficient documentation

## 2017-03-03 DIAGNOSIS — I10 Essential (primary) hypertension: Secondary | ICD-10-CM | POA: Diagnosis not present

## 2017-03-03 DIAGNOSIS — Z7984 Long term (current) use of oral hypoglycemic drugs: Secondary | ICD-10-CM | POA: Insufficient documentation

## 2017-03-03 DIAGNOSIS — Z7982 Long term (current) use of aspirin: Secondary | ICD-10-CM | POA: Insufficient documentation

## 2017-03-03 LAB — GLUCOSE, CAPILLARY: Glucose-Capillary: 181 mg/dL — ABNORMAL HIGH (ref 65–99)

## 2017-03-03 MED ORDER — PREDNISONE 10 MG PO TABS: ORAL_TABLET | ORAL | 0 refills | Status: DC

## 2017-03-03 NOTE — ED Triage Notes (Signed)
Pt reports "crick" in neck x3 weeks, reports pain radiates to base of posterior left skull.

## 2017-03-03 NOTE — ED Notes (Signed)
See triage note  States was seen in ED and by PCP for neck pain  Was given flexeril w/o relief  States he still has intermittent pain to neck but is here mainly for pain to left side of head  Denies any injury ,fever or n/v

## 2017-03-03 NOTE — ED Provider Notes (Signed)
[facility] Emergency Department Provider Note  ____________________________________________  Time seen: Approximately 2:19 PM  I have reviewed the triage vital signs and the nursing notes.   HISTORY  Chief Complaint Torticollis    HPI Eric Browning is a 66 y.o. male presents to the emergency department with left-sided neck pain for 3 weeks. Pain is worse when he turns his head side to side. Pain does not radiate. It used to be in his shoulders but this has resolved. Occasionally he has a headache in the back of his head. He does not have a headache now. No trauma. Patient was seen by primary care and given Flexeril. He also came to the emergency department was given a Toradol and Norflex injection.He denies fever, visual changes, photophobia, arm pain, shortness of breath, chest pain, nausea, vomiting, abdominal pain, numbness, tingling, rash.   Past Medical History:  Diagnosis Date  . Diabetes ([place])   . Gout   . High cholesterol   . Hypertension     Patient Active Problem List   Diagnosis Date Noted  . Chest pain 12/09/2016  . 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 COLONOSCOPY    . COLONOSCOPY WITH PROPOFOL N/A 08/03/2015   Procedure: COLONOSCOPY WITH PROPOFOL;  Surgeon: Manya Silvas, MD;  Location: [facility] ENDOSCOPY;  Service: Endoscopy;  Laterality: N/A;  . EUS N/A 09/03/2015   Procedure: LOWER ENDOSCOPIC ULTRASOUND (EUS);  Surgeon: Holly Bodily, MD;  Location: [facility] ENDOSCOPY;  Service: Endoscopy;  Laterality: N/A;  . right eye lens implant    . TONSILLECTOMY    . TOTAL KNEE ARTHROPLASTY Right 05/12/2016   Procedure: TOTAL KNEE ARTHROPLASTY;  Surgeon: Corky Mull, MD;  Location: [facility] ORS;  Service: Orthopedics;  Laterality: Right;    Prior to Admission medications   Medication Sig Start Date End Date Taking? Authorizing Provider  allopurinol (ZYLOPRIM) 300  MG tablet Take 300 mg by mouth daily.    [provider]  aspirin EC 81 MG EC tablet Take 1 tablet (81 mg total) by mouth daily. 12/10/16   [name], MD  colchicine 0.6 MG tablet Take 0.6 mg by mouth daily as needed. For gout flare-ups    [provider]  cyclobenzaprine (FLEXERIL) 10 MG tablet Take 1 tablet (10 mg total) by mouth 3 (three) times daily as needed for muscle spasms. 12/09/16   [name], MD  etodolac (LODINE) 200 MG capsule Take 200 mg by mouth every 8 (eight) hours.    [provider]  ketorolac (TORADOL) 10 MG tablet Take 1 tablet (10 mg total) by mouth every 6 (six) hours as needed. 02/17/17   Sable Feil, PA-C  metFORMIN (GLUCOPHAGE) 500 MG tablet Take 500 mg by mouth 2 (two) times daily.     [provider]  metoprolol tartrate (LOPRESSOR) 25 MG tablet Take 25 mg by mouth 2 (two) times daily.    [provider]  Olmesartan-Amlodipine-HCTZ (TRIBENZOR) 40-10-25 MG TABS Take 1 tablet by mouth daily.     [provider]  orphenadrine (NORFLEX) 100 MG tablet Take 1 tablet (100 mg total) by mouth 2 (two) times daily. 02/17/17   Sable Feil, PA-C  potassium chloride SA (K-DUR,KLOR-CON) 20 MEQ tablet Take 20 mEq by mouth 3 (three) times daily.     [provider]  pravastatin (PRAVACHOL) 20 MG tablet Take 20 mg by mouth daily.    [provider]  predniSONE (DELTASONE) 10 MG tablet Take 6 tablets on day 1, take 5 tablets on day 2, take 4 tablets on day 3, take 3 tablets on day 4, take 2 tablets on day 5, take 1 tablet on day 6 03/03/17   Laban Emperor, PA-C  traMADol (ULTRAM) 50 MG tablet Take 1 tablet (50 mg total) by mouth every 6 (six) hours as needed for moderate pain or severe pain. 12/09/16   [name], MD    Allergies Patient has no known allergies.  Family History  Problem Relation Age of Onset  . Heart disease Mother   . Stroke Father   . Hypertension Father   . Gout  Father     Social History Social History  Substance Use Topics  . Smoking status: Former Research scientist (life sciences)  . Smokeless tobacco: Never Used     Comment: quit 1990  . Alcohol use No     Review of Systems  Constitutional: No fever/chills ENT: No upper respiratory complaints. Cardiovascular: No chest pain. Respiratory: No cough. No SOB. Gastrointestinal: No abdominal pain.  No nausea, no vomiting.  Skin: Negative for rash, abrasions, lacerations, ecchymosis. Neurological: Negative for numbness or tingling   ____________________________________________   PHYSICAL EXAM:  VITAL SIGNS: ED Triage Vitals  Enc Vitals Group     BP 03/03/17 1349 (!) 161/84     Pulse Rate 03/03/17 1349 95     Resp 03/03/17 1349 16     Temp 03/03/17 1349 98.6 F (37 C)     Temp Source 03/03/17 1349 Oral     SpO2 03/03/17 1349 98 %     Weight 03/03/17 1350 203 lb (92.1 kg)     Height 03/03/17 1350 5\' 7"  (1.702 m)     Head Circumference --      Peak Flow --      Pain Score 03/03/17 1349 10     Pain Loc --      Pain Edu? --      Excl. in [place]? --      Constitutional: Alert and oriented. Well appearing and in no acute distress. Eyes: Conjunctivae are normal. PERRL. EOMI. Head: Atraumatic. ENT:      Ears:      Nose: No congestion/rhinnorhea.      Mouth/Throat: Mucous membranes are moist.  Neck: No stridor. Pinpoint tenderness to palpation at superior portion of trapezius on left side. No cervical spine tenderness to palpation. Pain is worse with left rotation. No pain with extension or flexion. Cardiovascular: Normal rate, regular rhythm.  Good peripheral circulation. Respiratory: Normal respiratory effort without tachypnea or retractions. Lungs CTAB. Good air entry to the bases with no decreased or absent breath sounds. Musculoskeletal: No gross deformities appreciated.  Neurologic:  Normal speech and language. No gross focal neurologic deficits are appreciated.  Cranial nerves: 2-10 normal as tested.  Strength 5/5 in upper and lower extremities Cerebellar: Finger-nose-finger WNL, Heel to shin WNL Sensorimotor: No pronator drift. Speech: No dysarthria or expressive aphasia Skin:  Skin is warm, dry and intact. No rash noted.   ____________________________________________   LABS (all labs ordered are listed, but only abnormal results are displayed)  Labs Reviewed  GLUCOSE, CAPILLARY - Abnormal; Notable for the following:       Result Value   Glucose-Capillary 181 (*)    All other components within normal limits  CBG MONITORING, ED   ____________________________________________  EKG   ____________________________________________  RADIOLOGY  No results found.  ____________________________________________    PROCEDURES  Procedure(s) performed:    Procedures    Medications - No data to display   ____________________________________________   INITIAL IMPRESSION / ASSESSMENT AND PLAN / ED COURSE  Pertinent labs & imaging results that were available during my care of the patient were reviewed by me and considered in my medical decision making (see chart for details).  Review of the [place] CSRS was performed in accordance of the [place] prior to dispensing any controlled drugs.   Patient's diagnosis is consistent with torticollis. Vital signs and exam are reassuring. Neuro exam within normal limits. Patient will be discharged home with prescriptions for prednisone. Patient is to follow up with PCP as directed. Patient is given ED precautions to return to the ED for any worsening or new symptoms.     ____________________________________________  FINAL CLINICAL IMPRESSION(S) / ED DIAGNOSES  Final diagnoses:  Torticollis      NEW MEDICATIONS STARTED DURING THIS VISIT:  Discharge Medication List as of 03/03/2017  2:45 PM    START taking these medications   Details  predniSONE (DELTASONE) 10 MG tablet Take 6 tablets on day 1, take 5 tablets on day 2, take 4  tablets on day 3, take 3 tablets on day 4, take 2 tablets on day 5, take 1 tablet on day 6, Print            This chart was dictated using voice recognition software/Dragon. Despite best efforts to proofread, errors can occur which can change the meaning. Any change was purely unintentional.    Laban Emperor, PA-C 03/03/17 1519    Merlyn Lot, MD 03/03/17 [phone]

## 2017-03-12 ENCOUNTER — Encounter: Payer: Self-pay | Admitting: Emergency Medicine

## 2017-03-12 ENCOUNTER — Emergency Department
Admission: EM | Admit: 2017-03-12 | Discharge: 2017-03-13 | Disposition: A | Discharge status: Home / Self Care | Payer: Managed Care, Other (non HMO) | Attending: Emergency Medicine | Admitting: Emergency Medicine

## 2017-03-12 ENCOUNTER — Emergency Department: Payer: Managed Care, Other (non HMO)

## 2017-03-12 DIAGNOSIS — M436 Torticollis: Secondary | ICD-10-CM | POA: Diagnosis not present

## 2017-03-12 DIAGNOSIS — Z87891 Personal history of nicotine dependence: Secondary | ICD-10-CM | POA: Insufficient documentation

## 2017-03-12 DIAGNOSIS — Z7902 Long term (current) use of antithrombotics/antiplatelets: Secondary | ICD-10-CM | POA: Insufficient documentation

## 2017-03-12 DIAGNOSIS — Z7982 Long term (current) use of aspirin: Secondary | ICD-10-CM | POA: Insufficient documentation

## 2017-03-12 DIAGNOSIS — I10 Essential (primary) hypertension: Secondary | ICD-10-CM | POA: Diagnosis not present

## 2017-03-12 DIAGNOSIS — Z7984 Long term (current) use of oral hypoglycemic drugs: Secondary | ICD-10-CM | POA: Diagnosis not present

## 2017-03-12 DIAGNOSIS — G44209 Tension-type headache, unspecified, not intractable: Secondary | ICD-10-CM

## 2017-03-12 DIAGNOSIS — Z96651 Presence of right artificial knee joint: Secondary | ICD-10-CM | POA: Insufficient documentation

## 2017-03-12 DIAGNOSIS — E119 Type 2 diabetes mellitus without complications: Secondary | ICD-10-CM | POA: Diagnosis not present

## 2017-03-12 DIAGNOSIS — Z79899 Other long term (current) drug therapy: Secondary | ICD-10-CM | POA: Insufficient documentation

## 2017-03-12 DIAGNOSIS — M542 Cervicalgia: Secondary | ICD-10-CM | POA: Diagnosis present

## 2017-03-12 LAB — BASIC METABOLIC PANEL
Anion gap: 8 (ref 5–15)
BUN: 20 mg/dL (ref 6–20)
CHLORIDE: 104 mmol/L (ref 101–111)
CO2: 30 mmol/L (ref 22–32)
CREATININE: 1.24 mg/dL (ref 0.61–1.24)
Calcium: 9.7 mg/dL (ref 8.9–10.3)
GFR calc Af Amer: >60 mL/min (ref >60)
GFR calc non Af Amer: 58 mL/min — ABNORMAL LOW (ref >60)
GLUCOSE: 104 mg/dL — AB (ref 65–99)
POTASSIUM: 3.3 mmol/L — AB (ref 3.5–5.1)
Sodium: 142 mmol/L (ref 135–145)

## 2017-03-12 LAB — CBC WITH DIFFERENTIAL/PLATELET
Basophils Absolute: 0.1 10*3/uL (ref 0–0.1)
Basophils Relative: 1 %
EOS ABS: 0.2 10*3/uL (ref 0–0.7)
Eosinophils Relative: 2 %
HEMATOCRIT: 33.8 % — AB (ref 40.0–52.0)
Hemoglobin: 11 g/dL — ABNORMAL LOW (ref 13.0–18.0)
LYMPHS ABS: 1.7 10*3/uL (ref 1.0–3.6)
LYMPHS PCT: 14 %
MCH: 27.9 pg (ref 26.0–34.0)
MCHC: 32.5 g/dL (ref 32.0–36.0)
MCV: 85.7 fL (ref 80.0–100.0)
MONOS PCT: 13 %
Monocytes Absolute: 1.5 10*3/uL — ABNORMAL HIGH (ref 0.2–1.0)
NEUTROS PCT: 70 %
Neutro Abs: 8.3 10*3/uL — ABNORMAL HIGH (ref 1.4–6.5)
Platelets: 331 10*3/uL (ref 150–440)
RBC: 3.94 MIL/uL — AB (ref 4.40–5.90)
RDW: 17 % — ABNORMAL HIGH (ref 11.5–14.5)
WBC: 11.8 10*3/uL — AB (ref 3.8–10.6)

## 2017-03-12 MED ORDER — SODIUM CHLORIDE 0.9 % IV BOLUS (SEPSIS)
500.0000 mL | Freq: Once | INTRAVENOUS | Status: AC
  Administered 2017-03-12: 500 mL via INTRAVENOUS

## 2017-03-12 MED ORDER — LORAZEPAM 2 MG/ML IJ SOLN
0.5000 mg | Freq: Once | INTRAMUSCULAR | Status: AC
  Administered 2017-03-12: 0.5 mg via INTRAVENOUS
  Filled 2017-03-12: qty 1

## 2017-03-12 NOTE — ED Notes (Signed)
Pt states that he has been having a pain "crook in his neck" for the past month. Pt states that it went away for about a week but has returned, pt appears to be stiff on the left side of his neck as well as radiating up into the left side of his head. Pt states that he has been having his wife massage it as well as using a heating pad to it without relief, pt denies any cold symptoms or fever, pt also denies any injury, pt denies tingling or pain in to his arms

## 2017-03-12 NOTE — ED Triage Notes (Signed)
Pt reports that he has been here several times for a pain in the left side of his neck that comes and goes. He also reports a headache at this time. Pt denies any falls or LOC or head trauma of any kind. Pt requests a CT scan at this time. Pt is ambulatory to triage with NAD noted at this time.

## 2017-03-12 NOTE — ED Notes (Signed)
Pt ambulatory to room without difficulty 

## 2017-03-12 NOTE — ED Provider Notes (Signed)
[facility] Emergency Department Provider Note   ____________________________________________   First MD Initiated Contact with Patient 03/12/17 2330     (approximate)  I have reviewed the triage vital signs and the nursing notes.   HISTORY  Chief Complaint Headache    HPI Eric Browning is a 66 y.o. male who presents to the ED from home with a chief complaint of left neck pain and left-sided headache. Patient reports symptoms intermittently for the past month. Has been seen in the ED twice for same. He was placed on Toradol influx well without relief. Subsequently placed on prednisone without relief of symptoms. Has not seen his PCP for the same complaint. Reports gradual onset left-sided posterior headache today after church. He awoke this morning with left-sided neck pain with muscle spasms. Denies associated vision changes, nausea, vomiting or dizziness. Denies recent fever, chills, tick bites, chest pain, shortness of breath, abdominal pain. Denies recent travel or trauma. Nothing makes his symptoms better. Movement makes his symptoms worse.   Past Medical History:  Diagnosis Date  . Diabetes ([place])   . Gout   . High cholesterol   . Hypertension     Patient Active Problem List   Diagnosis Date Noted  . Chest pain 12/09/2016  . 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 COLONOSCOPY    . COLONOSCOPY WITH PROPOFOL N/A 08/03/2015   Procedure: COLONOSCOPY WITH PROPOFOL;  Surgeon: Manya Silvas, MD;  Location: [facility] ENDOSCOPY;  Service: Endoscopy;  Laterality: N/A;  . EUS N/A 09/03/2015   Procedure: LOWER ENDOSCOPIC ULTRASOUND (EUS);  Surgeon: Holly Bodily, MD;  Location: [facility] ENDOSCOPY;  Service: Endoscopy;  Laterality: N/A;  . right eye lens implant    . TONSILLECTOMY    . TOTAL KNEE ARTHROPLASTY Right 05/12/2016   Procedure: TOTAL KNEE ARTHROPLASTY;  Surgeon: Corky Mull, MD;  Location: [facility] ORS;  Service: Orthopedics;  Laterality: Right;    Prior to Admission medications   Medication Sig Start Date End Date Taking? Authorizing Provider  allopurinol (ZYLOPRIM) 300 MG tablet Take 300 mg by mouth daily.    [provider]  aspirin EC 81 MG EC tablet Take 1 tablet (81 mg total) by mouth daily. 12/10/16   [name], MD  colchicine 0.6 MG tablet Take 0.6 mg by mouth daily as needed. For gout flare-ups    [provider]  cyclobenzaprine (FLEXERIL) 10 MG tablet Take 1 tablet (10 mg total) by mouth 3 (three) times daily as needed for muscle spasms. 12/09/16   [name], MD  etodolac (LODINE) 200 MG capsule Take 200 mg by mouth every 8 (eight) hours.    [provider]  ketorolac (TORADOL) 10 MG tablet Take 1 tablet (10 mg total) by mouth every 6 (six) hours as needed. 02/17/17   Sable Feil, PA-C  metFORMIN (GLUCOPHAGE) 500 MG tablet Take 500 mg by mouth 2 (two) times daily.     [provider]  metoprolol tartrate (LOPRESSOR) 25 MG tablet Take 25 mg by mouth 2 (two) times daily.    [provider]  Olmesartan-Amlodipine-HCTZ (TRIBENZOR) 40-10-25 MG TABS Take 1 tablet by mouth daily.     [provider]  orphenadrine (NORFLEX) 100 MG tablet Take 1 tablet (100 mg total) by mouth 2 (two) times daily. 02/17/17   Sable Feil, PA-C  potassium chloride SA (K-DUR,KLOR-CON) 20 MEQ tablet Take 20 mEq by  mouth 3 (three) times daily.     [provider]  pravastatin (PRAVACHOL) 20 MG tablet Take 20 mg by mouth daily.    [provider]  predniSONE (DELTASONE) 10 MG tablet Take 6 tablets on day 1, take 5 tablets on day 2, take 4 tablets on day 3, take 3 tablets on day 4, take 2 tablets on day 5, take 1 tablet on day 6 03/03/17   Laban Emperor, PA-C  traMADol (ULTRAM) 50 MG tablet Take 1 tablet (50 mg total) by mouth every 6 (six) hours as needed for moderate pain or severe pain.  12/09/16   [name], MD    Allergies Patient has no known allergies.  Family History  Problem Relation Age of Onset  . Heart disease Mother   . Stroke Father   . Hypertension Father   . Gout Father     Social History Social History  Substance Use Topics  . Smoking status: Former Research scientist (life sciences)  . Smokeless tobacco: Never Used     Comment: quit 1990  . Alcohol use No    Review of Systems  Constitutional: No fever/chills. Eyes: No visual changes. ENT: No sore throat. Cardiovascular: Denies chest pain. Respiratory: Denies shortness of breath. Gastrointestinal: No abdominal pain.  No nausea, no vomiting.  No diarrhea.  No constipation. Genitourinary: Negative for dysuria. Musculoskeletal: Positive for neck pain. Skin: Negative for rash. Neurological: Positive for headache. Negative for focal weakness or numbness.   ____________________________________________   PHYSICAL EXAM:  VITAL SIGNS: ED Triage Vitals [03/12/17 1927]  Enc Vitals Group     BP (!) 180/85     Pulse Rate 90     Resp 18     Temp 98.7 F (37.1 C)     Temp Source Oral     SpO2 99 %     Weight 205 lb (93 kg)     Height 5\' 7"  (1.702 m)     Head Circumference      Peak Flow      Pain Score 8     Pain Loc      Pain Edu?      Excl. in [place]?     Constitutional: Alert and oriented. Well appearing and in no acute distress. Eyes: Conjunctivae are normal. PERRL. EOMI. Head: Atraumatic. Nose: No congestion/rhinnorhea. Mouth/Throat: Mucous membranes are moist.  Oropharynx non-erythematous. Neck: No stridor.  No cervical spine tenderness to palpation.  No carotid bruits.  Left paraspinal muscle spasms. Decreased range of motion especially turning towards the left against resistance. Spasms to left trapezius. Hematological/Lymphatic/Immunilogical: No cervical lymphadenopathy. Cardiovascular: Normal rate, regular rhythm. Grossly normal heart sounds.  Good peripheral circulation. Respiratory: Normal  respiratory effort.  No retractions. Lungs CTAB. Gastrointestinal: Soft and nontender. No distention. No abdominal bruits. No CVA tenderness. Musculoskeletal: No lower extremity tenderness nor edema.  No joint effusions. Neurologic:  Normal speech and language. No gross focal neurologic deficits are appreciated. 5/5 motor strength and sensation all extremities. MAEx4. No gait instability. Skin:  Skin is warm, dry and intact. No rash noted. No petechiae. Psychiatric: Mood and affect are normal. Speech and behavior are normal.  ____________________________________________   LABS (all labs ordered are listed, but only abnormal results are displayed)  Labs Reviewed  CBC WITH DIFFERENTIAL/PLATELET - Abnormal; Notable for the following:       Result Value   WBC 11.8 (*)    RBC 3.94 (*)    Hemoglobin 11.0 (*)    HCT 33.8 (*)  RDW 17.0 (*)    Neutro Abs 8.3 (*)    Monocytes Absolute 1.5 (*)    All other components within normal limits  BASIC METABOLIC PANEL - Abnormal; Notable for the following:    Potassium 3.3 (*)    Glucose, Bld 104 (*)    GFR calc non Af Amer 58 (*)    All other components within normal limits   ____________________________________________  EKG  None ____________________________________________  RADIOLOGY  Ct Angio Head W Or Wo Contrast  Result Date: 03/13/2017 CLINICAL DATA:  Neck pain for 1 month radiating to LEFT head. Evaluate LEFT posterior headache. History of hypertension and diabetes. EXAM: CT ANGIOGRAPHY HEAD AND NECK TECHNIQUE: Multidetector CT imaging of the head and neck was performed using the standard protocol during bolus administration of intravenous contrast. Multiplanar CT image reconstructions and MIPs were obtained to evaluate the vascular anatomy. Carotid stenosis measurements (when applicable) are obtained utilizing NASCET criteria, using the distal internal carotid diameter as the denominator. CONTRAST:  75 cc Isovue 370 COMPARISON:  None.  FINDINGS: CTA NECK AORTIC ARCH: Normal appearance of the thoracic arch, mild calcific atherosclerosis. The origins of the innominate, left Common carotid artery and subclavian artery are widely patent. RIGHT CAROTID SYSTEM: Common carotid artery is widely patent. Normal appearance of the carotid bifurcation without hemodynamically significant stenosis by NASCET criteria, mild intimal thickening and calcific atherosclerosis. Normal appearance of the included internal carotid artery. LEFT CAROTID SYSTEM: Common carotid artery is widely patent. Normal appearance of the carotid bifurcation without hemodynamically significant stenosis by NASCET criteria, mild intimal thickening and calcific atherosclerosis of. Normal appearance of the included internal carotid artery. VERTEBRAL ARTERIES:Codominant vertebral artery's. Moderate calcific atherosclerosis LEFT V4 artery origin resulting in severe stenosis. SKELETON: No acute osseous process though bone windows have not been submitted. Patient is edentulous. Moderate C3-4 through C5-6 spondylosis resulting and severe RIGHT C4-5, severe bilateral C5-6 neural foraminal narrowing. OTHER NECK: Soft tissues of the neck are non-acute though, not tailored for evaluation. Mild coronary artery calcifications in the included chest. Numerous presumably reactive lymph nodes in the neck without lymphadenopathy by CT size criteria. CTA HEAD- noisy image quality ANTERIOR CIRCULATION: Patent cervical internal carotid arteries, petrous, cavernous and supra clinoid internal carotid arteries. Mild stenosis RIGHT anterior genu ; mild to moderate calcific atherosclerosis of the carotid siphons. Widely patent anterior communicating artery. Patent anterior and middle cerebral arteries. No large vessel occlusion, significant stenosis, contrast extravasation or aneurysm. POSTERIOR CIRCULATION: Patent vertebral arteries, vertebrobasilar junction and basilar artery, as well as main branch vessels.  Patent posterior cerebral arteries. Small RIGHT posterior communicating artery present. No large vessel occlusion, significant stenosis, contrast extravasation or aneurysm. VENOUS SINUSES: Major dural venous sinuses are patent though not tailored for evaluation on this angiographic examination. ANATOMIC VARIANTS: None. DELAYED PHASE: No abnormal intracranial enhancement. MIP images reviewed. IMPRESSION: CTA NECK: 1. No hemodynamically significant stenosis of the carotid artery's nor, acute vascular process. 2. Atherosclerosis resulting in severe stenosis LEFT vertebral artery origin. 3. Severe C4-5 and C5-6 neural foraminal narrowing. CTA HEAD: 1. No emergent large vessel occlusion or severe stenosis. Electronically Signed   By: Elon Alas M.D.   On: 03/13/2017 00:49   Ct Head Wo Contrast  Result Date: 03/12/2017 CLINICAL DATA:  Left-sided neck pain that comes and goes with headache. EXAM: CT HEAD WITHOUT CONTRAST TECHNIQUE: Contiguous axial images were obtained from the base of the skull through the vertex without intravenous contrast. COMPARISON:  None. FINDINGS: Brain: Chronic appearing small vessel ischemic  disease of periventricular white matter. Small focus of encephalomalacia involving the posterior right parietal lobe consistent with small chronic infarct. No acute intracranial hemorrhage, mass or midline shift. No extra-axial fluid collections. No hydrocephalus. Basal cisterns and fourth ventricle are midline. No effacement noted. Vascular: Vascular calcifications of both vertebral arteries and carotid siphons. No hyperdense vessels. Skull:  Negative for acute fracture or focal lesions. Sinuses/Orbits: No acute finding. Other: None. IMPRESSION: 1. No acute intracranial abnormality. 2. 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 3. Small focus of encephalomalacia in the right posterior parietal lobe consistent with a small chronic infarct. Electronically Signed   By:  Ashley Royalty M.D.   On: 03/12/2017 20:17   Ct Angio Neck W And/or Wo Contrast  Result Date: 03/13/2017 CLINICAL DATA:  Neck pain for 1 month radiating to LEFT head. Evaluate LEFT posterior headache. History of hypertension and diabetes. EXAM: CT ANGIOGRAPHY HEAD AND NECK TECHNIQUE: Multidetector CT imaging of the head and neck was performed using the standard protocol during bolus administration of intravenous contrast. Multiplanar CT image reconstructions and MIPs were obtained to evaluate the vascular anatomy. Carotid stenosis measurements (when applicable) are obtained utilizing NASCET criteria, using the distal internal carotid diameter as the denominator. CONTRAST:  75 cc Isovue 370 COMPARISON:  None. FINDINGS: CTA NECK AORTIC ARCH: Normal appearance of the thoracic arch, mild calcific atherosclerosis. The origins of the innominate, left Common carotid artery and subclavian artery are widely patent. RIGHT CAROTID SYSTEM: Common carotid artery is widely patent. Normal appearance of the carotid bifurcation without hemodynamically significant stenosis by NASCET criteria, mild intimal thickening and calcific atherosclerosis. Normal appearance of the included internal carotid artery. LEFT CAROTID SYSTEM: Common carotid artery is widely patent. Normal appearance of the carotid bifurcation without hemodynamically significant stenosis by NASCET criteria, mild intimal thickening and calcific atherosclerosis of. Normal appearance of the included internal carotid artery. VERTEBRAL ARTERIES:Codominant vertebral artery's. Moderate calcific atherosclerosis LEFT V4 artery origin resulting in severe stenosis. SKELETON: No acute osseous process though bone windows have not been submitted. Patient is edentulous. Moderate C3-4 through C5-6 spondylosis resulting and severe RIGHT C4-5, severe bilateral C5-6 neural foraminal narrowing. OTHER NECK: Soft tissues of the neck are non-acute though, not tailored for evaluation. Mild  coronary artery calcifications in the included chest. Numerous presumably reactive lymph nodes in the neck without lymphadenopathy by CT size criteria. CTA HEAD- noisy image quality ANTERIOR CIRCULATION: Patent cervical internal carotid arteries, petrous, cavernous and supra clinoid internal carotid arteries. Mild stenosis RIGHT anterior genu ; mild to moderate calcific atherosclerosis of the carotid siphons. Widely patent anterior communicating artery. Patent anterior and middle cerebral arteries. No large vessel occlusion, significant stenosis, contrast extravasation or aneurysm. POSTERIOR CIRCULATION: Patent vertebral arteries, vertebrobasilar junction and basilar artery, as well as main branch vessels. Patent posterior cerebral arteries. Small RIGHT posterior communicating artery present. No large vessel occlusion, significant stenosis, contrast extravasation or aneurysm. VENOUS SINUSES: Major dural venous sinuses are patent though not tailored for evaluation on this angiographic examination. ANATOMIC VARIANTS: None. DELAYED PHASE: No abnormal intracranial enhancement. MIP images reviewed. IMPRESSION: CTA NECK: 1. No hemodynamically significant stenosis of the carotid artery's nor, acute vascular process. 2. Atherosclerosis resulting in severe stenosis LEFT vertebral artery origin. 3. Severe C4-5 and C5-6 neural foraminal narrowing. CTA HEAD: 1. No emergent large vessel occlusion or severe stenosis. Electronically Signed   By: Elon Alas M.D.   On: 03/13/2017 00:49    ____________________________________________   PROCEDURES  Procedure(s) performed: None  Procedures  Critical Care performed: No  ____________________________________________   INITIAL IMPRESSION / ASSESSMENT AND PLAN / ED COURSE  Pertinent labs & imaging results that were available during my care of the patient were reviewed by me and considered in my medical decision making (see chart for details).  66 year old male  who returns to the ER for a one-month history of left neck pain and posterior headache. Demanded a CT head at triage which was negative for acute abnormality. Most likely patient has tension headache secondary to left neck spasms from torticollis; however, given that this is the patient's third or fourth emergency department visit in the past month, will obtain CT angiogram head and neck to evaluate for vascular abnormalities.  Clinical Course as of Mar 14 55  Mon Mar 13, 2017  2863 Patient improved. Updated patient and spouse of CT imaging results. We'll discharge home with prescription for analgesia, muscle relaxer and follow-up with orthopedics. Strict return precautions given. Both verbalize understanding and agree with plan of care.  [JS]    Clinical Course User Index [JS] Paulette Blanch, MD     ____________________________________________   FINAL CLINICAL IMPRESSION(S) / ED DIAGNOSES  Final diagnoses:  Torticollis  Tension headache      NEW MEDICATIONS STARTED DURING THIS VISIT:  New Prescriptions   No medications on file     Note:  This document was prepared using Dragon voice recognition software and may include unintentional dictation errors.    Paulette Blanch, MD 03/13/17 [phone]

## 2017-03-13 ENCOUNTER — Emergency Department: Payer: Managed Care, Other (non HMO)

## 2017-03-13 ENCOUNTER — Encounter: Payer: Self-pay | Admitting: Radiology

## 2017-03-13 MED ORDER — HYDROCODONE-ACETAMINOPHEN 5-325 MG PO TABS
1.0000 | ORAL_TABLET | Freq: Once | ORAL | Status: AC
  Administered 2017-03-13: 1 via ORAL
  Filled 2017-03-13: qty 1

## 2017-03-13 MED ORDER — IBUPROFEN 800 MG PO TABS: 800.0000 mg | ORAL_TABLET | Freq: Three times a day (TID) | ORAL | 0 refills | Status: DC | PRN

## 2017-03-13 MED ORDER — KETOROLAC TROMETHAMINE 30 MG/ML IJ SOLN
10.0000 mg | Freq: Once | INTRAMUSCULAR | Status: AC
  Administered 2017-03-13: 9.9 mg via INTRAVENOUS
  Filled 2017-03-13: qty 1

## 2017-03-13 MED ORDER — DIAZEPAM 2 MG PO TABS: 2.0000 mg | ORAL_TABLET | Freq: Three times a day (TID) | ORAL | 0 refills | Status: DC | PRN

## 2017-03-13 MED ORDER — HYDROCODONE-ACETAMINOPHEN 5-325 MG PO TABS: 1.0000 | ORAL_TABLET | Freq: Four times a day (QID) | ORAL | 0 refills | Status: DC | PRN

## 2017-03-13 MED ORDER — IOPAMIDOL (ISOVUE-370) INJECTION 76%
75.0000 mL | Freq: Once | INTRAVENOUS | Status: AC | PRN
  Administered 2017-03-13: 75 mL via INTRAVENOUS

## 2017-03-13 NOTE — ED Notes (Signed)
Pt taken to ct 

## 2017-03-13 NOTE — Discharge Instructions (Signed)
1.  You may take medicines as needed for pain and muscle spasms (Motrin/Norco/Valium #15). 2.  Apply moist heat to affected area several times daily. 3.  Return to the ER for worsening symptoms, persistent vomiting, difficulty breathing or other concerns. 

## 2017-03-15 ENCOUNTER — Other Ambulatory Visit: Payer: Self-pay | Admitting: Student

## 2017-03-15 DIAGNOSIS — G44209 Tension-type headache, unspecified, not intractable: Secondary | ICD-10-CM

## 2017-03-22 ENCOUNTER — Ambulatory Visit
Admission: RE | Admit: 2017-03-22 | Discharge: 2017-03-22 | Disposition: A | Discharge status: Home / Self Care | Payer: Managed Care, Other (non HMO) | Source: Ambulatory Visit | Attending: Student | Admitting: Student

## 2017-03-22 DIAGNOSIS — G44209 Tension-type headache, unspecified, not intractable: Secondary | ICD-10-CM | POA: Diagnosis present

## 2017-03-22 DIAGNOSIS — Z8673 Personal history of transient ischemic attack (TIA), and cerebral infarction without residual deficits: Secondary | ICD-10-CM | POA: Insufficient documentation

## 2017-03-22 MED ORDER — GADOBENATE DIMEGLUMINE 529 MG/ML IV SOLN
20.0000 mL | Freq: Once | INTRAVENOUS | Status: AC | PRN
  Administered 2017-03-22: 19 mL via INTRAVENOUS

## 2017-11-20 DIAGNOSIS — M7582 Other shoulder lesions, left shoulder: Secondary | ICD-10-CM | POA: Insufficient documentation

## 2017-11-24 ENCOUNTER — Other Ambulatory Visit: Payer: Self-pay | Admitting: Surgery

## 2017-11-24 DIAGNOSIS — M75121 Complete rotator cuff tear or rupture of right shoulder, not specified as traumatic: Secondary | ICD-10-CM

## 2017-11-24 DIAGNOSIS — M12811 Other specific arthropathies, not elsewhere classified, right shoulder: Secondary | ICD-10-CM

## 2017-12-04 ENCOUNTER — Other Ambulatory Visit: Payer: Self-pay

## 2017-12-04 ENCOUNTER — Encounter
Admission: RE | Admit: 2017-12-04 | Discharge: 2017-12-04 | Disposition: A | Discharge status: Home / Self Care | Payer: 59 | Source: Ambulatory Visit | Attending: Surgery | Admitting: Surgery

## 2017-12-04 DIAGNOSIS — R9431 Abnormal electrocardiogram [ECG] [EKG]: Secondary | ICD-10-CM | POA: Insufficient documentation

## 2017-12-04 DIAGNOSIS — M12811 Other specific arthropathies, not elsewhere classified, right shoulder: Secondary | ICD-10-CM | POA: Diagnosis not present

## 2017-12-04 DIAGNOSIS — M75121 Complete rotator cuff tear or rupture of right shoulder, not specified as traumatic: Secondary | ICD-10-CM | POA: Diagnosis present

## 2017-12-04 HISTORY — DX: Unspecified osteoarthritis, unspecified site: M19.90

## 2017-12-04 HISTORY — DX: Cardiac murmur, unspecified: R01.1

## 2017-12-04 LAB — URINALYSIS, ROUTINE W REFLEX MICROSCOPIC
BACTERIA UA: NONE SEEN
BILIRUBIN URINE: NEGATIVE
Glucose, UA: NEGATIVE mg/dL
HGB URINE DIPSTICK: NEGATIVE
Ketones, ur: NEGATIVE mg/dL
Leukocytes, UA: NEGATIVE
Nitrite: NEGATIVE
PH: 6 (ref 5.0–8.0)
Protein, ur: 30 mg/dL — AB
SPECIFIC GRAVITY, URINE: 1.016 (ref 1.005–1.030)
Squamous Epithelial / LPF: NONE SEEN

## 2017-12-04 LAB — SURGICAL PCR SCREEN
MRSA, PCR: NEGATIVE
Staphylococcus aureus: NEGATIVE

## 2017-12-04 LAB — TYPE AND SCREEN
ABO/RH(D): B POS
Antibody Screen: NEGATIVE

## 2017-12-04 LAB — BASIC METABOLIC PANEL
Anion gap: 8 (ref 5–15)
BUN: 20 mg/dL (ref 6–20)
CHLORIDE: 103 mmol/L (ref 101–111)
CO2: 30 mmol/L (ref 22–32)
Calcium: 10.3 mg/dL (ref 8.9–10.3)
Creatinine, Ser: 1.19 mg/dL (ref 0.61–1.24)
GFR calc Af Amer: >60 mL/min (ref >60)
GFR calc non Af Amer: >60 mL/min (ref >60)
Glucose, Bld: 118 mg/dL — ABNORMAL HIGH (ref 65–99)
POTASSIUM: 3.3 mmol/L — AB (ref 3.5–5.1)
SODIUM: 141 mmol/L (ref 135–145)

## 2017-12-04 LAB — CBC WITH DIFFERENTIAL/PLATELET
Basophils Absolute: 0.1 10*3/uL (ref 0–0.1)
Basophils Relative: 1 %
EOS ABS: 0.2 10*3/uL (ref 0–0.7)
Eosinophils Relative: 2 %
HCT: 34.4 % — ABNORMAL LOW (ref 40.0–52.0)
Hemoglobin: 11.2 g/dL — ABNORMAL LOW (ref 13.0–18.0)
LYMPHS ABS: 1.6 10*3/uL (ref 1.0–3.6)
LYMPHS PCT: 15 %
MCH: 27.4 pg (ref 26.0–34.0)
MCHC: 32.4 g/dL (ref 32.0–36.0)
MCV: 84.6 fL (ref 80.0–100.0)
Monocytes Absolute: 0.9 10*3/uL (ref 0.2–1.0)
Monocytes Relative: 8 %
NEUTROS ABS: 8.3 10*3/uL — AB (ref 1.4–6.5)
NEUTROS PCT: 74 %
Platelets: 345 10*3/uL (ref 150–440)
RBC: 4.07 MIL/uL — AB (ref 4.40–5.90)
RDW: 16.3 % — ABNORMAL HIGH (ref 11.5–14.5)
WBC: 11.1 10*3/uL — AB (ref 3.8–10.6)

## 2017-12-04 LAB — PROTIME-INR
INR: 0.91
PROTHROMBIN TIME: 12.2 s (ref 11.4–15.2)

## 2017-12-04 NOTE — Patient Instructions (Addendum)
Your procedure is scheduled on: Tuesday,. April 16th  Report to [place]  To find out your arrival time please call [phone] between            1PM - 3PM on April 15th  Remember: Instructions that are not followed completely may result in  serious medical risk, up to and including death, or upon the discretion of  your surgeon and anesthesiologist your surgery may need to be rescheduled.     _X__ 1. Do not eat food after midnight the night before your procedure.                 No gum chewing or hard candies.                   You may drink clear liquids up to 2 hours                 before you are scheduled to arrive for your surgery-                   DO not drink clear liquids within 2 hours of the start of your surgery.                  Clear Liquids include:  water, apple juice without pulp, clear carbohydrate                 drink such as Clearfast of Gatorade, Black Coffee or Tea (Do not add                 anything to coffee or tea).  __X__2.  On the morning of surgery brush your teeth with toothpaste and water,                     you may rinse your mouth with mouthwash if you wish.                          Do not swallow any toothpaste of mouthwash.     _X__ 3.  No Alcohol for 24 hours before or after surgery.   _X__ 4.  Do Not Smoke or use e-cigarettes For 24 Hours Prior to Your Surgery.                 Do not use any chewable tobacco products for at least 6 hours prior to                 surgery.  ____  5.  Bring all medications with you on the day of surgery if instructed.   _X___  6.  Notify your doctor if there is any change in your medical condition      (cold, fever, infections).     Do not wear jewelry, make-up, hairpins, clips or nail polish. Do not wear lotions, powders, or perfumes. You may wear deodorant. Do not shave 48 hours prior to surgery. Men may shave face and neck. Do not bring valuables to  the hospital.    [facility] is not responsible for any belongings or valuables.  Contacts, dentures or bridgework may not be worn into surgery. Leave your suitcase in the car. After surgery it may be brought to your room. For patients admitted to the hospital, discharge time is determined by your treatment team.   Patients discharged the day of surgery will not be allowed to drive  home.   Please read over the following fact sheets that you were given:   PREPARING FOR SURGERY                  MRSA: STOP THE SPREAD   ____ Take these medicines the morning of surgery with A SIP OF WATER:    1.METOPROLOL  2. PRAVASTATIN  3. HYDRALAZINE  4. ALLOPURINOL  5. COLCHICINE  6.  ____ Fleet Enema (as directed)   __X__ Use CHG Soap as directed  ____ Use inhalers on the day of surgery  __X__ Stop metformin 2 days prior to surgery. LAST DOSE WILL BE ON April 13TH   __X___ Stop ALL ASPIRIN PRODUCTS NOW!!              THIS INCLUDES EXCEDRIN / ASPIRIN / EXCEDRIN  __X__ Stop Anti-inflammatories NOW!!             THIS INCLUDES IBUPROFEN / MOTRIN / ADVIL / ALEVE / TORADOL / ETODOLAC    ____ Stop supplements until after surgery.    ____ Bring C-Pap to the hospital.   CALL [phone] WITH NAME OF NEW MEDICATION YOU ARE TAKING  CONTINUE TAKING TRIBENZOR AND POTASSIUM BUT DO NOT TAKE ON THE DAY OF SURGERY  HAVE STOOL SOFTENERS AVAILABLE FOR HOME USE  WEAR SOMETHING OVERSIZED AND COMFORTABLE TO [place].

## 2017-12-05 ENCOUNTER — Ambulatory Visit
Admission: RE | Admit: 2017-12-05 | Discharge: 2017-12-05 | Disposition: A | Discharge status: Home / Self Care | Payer: 59 | Source: Ambulatory Visit | Attending: Surgery | Admitting: Surgery

## 2017-12-05 DIAGNOSIS — M75121 Complete rotator cuff tear or rupture of right shoulder, not specified as traumatic: Secondary | ICD-10-CM | POA: Diagnosis not present

## 2017-12-05 DIAGNOSIS — M12811 Other specific arthropathies, not elsewhere classified, right shoulder: Secondary | ICD-10-CM

## 2017-12-05 LAB — URINE CULTURE: CULTURE: NO GROWTH

## 2017-12-12 ENCOUNTER — Inpatient Hospital Stay: Payer: 59

## 2017-12-12 ENCOUNTER — Encounter
Admission: RE | Disposition: A | Discharge status: Home Health | Payer: Self-pay | Source: Ambulatory Visit | Attending: Surgery

## 2017-12-12 ENCOUNTER — Encounter: Payer: Self-pay | Admitting: Anesthesiology

## 2017-12-12 ENCOUNTER — Inpatient Hospital Stay: Payer: 59 | Admitting: Anesthesiology

## 2017-12-12 ENCOUNTER — Other Ambulatory Visit: Payer: Self-pay

## 2017-12-12 ENCOUNTER — Inpatient Hospital Stay
Admission: RE | Admit: 2017-12-12 | Discharge: 2017-12-13 | DRG: 483 | Disposition: A | Discharge status: Home Health | Payer: 59 | Source: Ambulatory Visit | Attending: Surgery | Admitting: Surgery

## 2017-12-12 DIAGNOSIS — H269 Unspecified cataract: Secondary | ICD-10-CM | POA: Diagnosis present

## 2017-12-12 DIAGNOSIS — M109 Gout, unspecified: Secondary | ICD-10-CM | POA: Diagnosis present

## 2017-12-12 DIAGNOSIS — M19042 Primary osteoarthritis, left hand: Secondary | ICD-10-CM | POA: Diagnosis present

## 2017-12-12 DIAGNOSIS — E669 Obesity, unspecified: Secondary | ICD-10-CM | POA: Diagnosis present

## 2017-12-12 DIAGNOSIS — E291 Testicular hypofunction: Secondary | ICD-10-CM | POA: Diagnosis present

## 2017-12-12 DIAGNOSIS — Z86010 Personal history of colonic polyps: Secondary | ICD-10-CM

## 2017-12-12 DIAGNOSIS — Z96611 Presence of right artificial shoulder joint: Secondary | ICD-10-CM

## 2017-12-12 DIAGNOSIS — Z6833 Body mass index (BMI) 33.0-33.9, adult: Secondary | ICD-10-CM | POA: Diagnosis not present

## 2017-12-12 DIAGNOSIS — R011 Cardiac murmur, unspecified: Secondary | ICD-10-CM | POA: Diagnosis present

## 2017-12-12 DIAGNOSIS — Z87891 Personal history of nicotine dependence: Secondary | ICD-10-CM | POA: Diagnosis not present

## 2017-12-12 DIAGNOSIS — Z7982 Long term (current) use of aspirin: Secondary | ICD-10-CM

## 2017-12-12 DIAGNOSIS — Z823 Family history of stroke: Secondary | ICD-10-CM

## 2017-12-12 DIAGNOSIS — Z79890 Hormone replacement therapy (postmenopausal): Secondary | ICD-10-CM

## 2017-12-12 DIAGNOSIS — M75121 Complete rotator cuff tear or rupture of right shoulder, not specified as traumatic: Secondary | ICD-10-CM | POA: Diagnosis present

## 2017-12-12 DIAGNOSIS — M19041 Primary osteoarthritis, right hand: Secondary | ICD-10-CM | POA: Diagnosis present

## 2017-12-12 DIAGNOSIS — Z8249 Family history of ischemic heart disease and other diseases of the circulatory system: Secondary | ICD-10-CM

## 2017-12-12 DIAGNOSIS — E119 Type 2 diabetes mellitus without complications: Secondary | ICD-10-CM | POA: Diagnosis present

## 2017-12-12 DIAGNOSIS — M19011 Primary osteoarthritis, right shoulder: Secondary | ICD-10-CM | POA: Diagnosis present

## 2017-12-12 DIAGNOSIS — E785 Hyperlipidemia, unspecified: Secondary | ICD-10-CM | POA: Diagnosis present

## 2017-12-12 DIAGNOSIS — I10 Essential (primary) hypertension: Secondary | ICD-10-CM | POA: Diagnosis present

## 2017-12-12 DIAGNOSIS — E7800 Pure hypercholesterolemia, unspecified: Secondary | ICD-10-CM | POA: Diagnosis present

## 2017-12-12 DIAGNOSIS — Z79899 Other long term (current) drug therapy: Secondary | ICD-10-CM

## 2017-12-12 HISTORY — PX: REVERSE SHOULDER ARTHROPLASTY: SHX5054

## 2017-12-12 LAB — GLUCOSE, CAPILLARY
GLUCOSE-CAPILLARY: 149 mg/dL — AB (ref 65–99)
GLUCOSE-CAPILLARY: 161 mg/dL — AB (ref 65–99)
GLUCOSE-CAPILLARY: 170 mg/dL — AB (ref 65–99)
Glucose-Capillary: 119 mg/dL — ABNORMAL HIGH (ref 65–99)

## 2017-12-12 LAB — POCT I-STAT 4, (NA,K, GLUC, HGB,HCT)
Glucose, Bld: 126 mg/dL — ABNORMAL HIGH (ref 65–99)
HEMATOCRIT: 36 % — AB (ref 39.0–52.0)
HEMOGLOBIN: 12.2 g/dL — AB (ref 13.0–17.0)
POTASSIUM: 3.3 mmol/L — AB (ref 3.5–5.1)
SODIUM: 144 mmol/L (ref 135–145)

## 2017-12-12 SURGERY — ARTHROPLASTY, SHOULDER, TOTAL, REVERSE
Anesthesia: General | Site: Shoulder | Laterality: Right | Wound class: Clean

## 2017-12-12 MED ORDER — DOCUSATE SODIUM 100 MG PO CAPS
100.0000 mg | ORAL_CAPSULE | Freq: Two times a day (BID) | ORAL | Status: DC
  Administered 2017-12-12 – 2017-12-13 (×2): 100 mg via ORAL
  Filled 2017-12-12 (×3): qty 1

## 2017-12-12 MED ORDER — BUPIVACAINE LIPOSOME 1.3 % IJ SUSP
INTRAMUSCULAR | Status: AC
  Filled 2017-12-12: qty 20

## 2017-12-12 MED ORDER — FENTANYL CITRATE (PF) 100 MCG/2ML IJ SOLN
25.0000 ug | INTRAMUSCULAR | Status: DC | PRN
  Administered 2017-12-12 (×2): 25 ug via INTRAVENOUS

## 2017-12-12 MED ORDER — ALBUTEROL SULFATE HFA 108 (90 BASE) MCG/ACT IN AERS
INHALATION_SPRAY | RESPIRATORY_TRACT | Status: DC | PRN
  Administered 2017-12-12: 4 via RESPIRATORY_TRACT

## 2017-12-12 MED ORDER — KETOROLAC TROMETHAMINE 15 MG/ML IJ SOLN
INTRAMUSCULAR | Status: AC
  Filled 2017-12-12: qty 1

## 2017-12-12 MED ORDER — FENTANYL CITRATE (PF) 100 MCG/2ML IJ SOLN
INTRAMUSCULAR | Status: AC
  Administered 2017-12-12: 50 ug via INTRAVENOUS
  Filled 2017-12-12: qty 2

## 2017-12-12 MED ORDER — FAMOTIDINE 20 MG PO TABS
20.0000 mg | ORAL_TABLET | Freq: Once | ORAL | Status: AC
  Administered 2017-12-12: 20 mg via ORAL

## 2017-12-12 MED ORDER — LIDOCAINE HCL (PF) 2 % IJ SOLN
INTRAMUSCULAR | Status: AC
  Filled 2017-12-12: qty 10

## 2017-12-12 MED ORDER — BUPIVACAINE LIPOSOME 1.3 % IJ SUSP
INTRAMUSCULAR | Status: DC | PRN
  Administered 2017-12-12: 20 mL via PERINEURAL

## 2017-12-12 MED ORDER — IRBESARTAN 150 MG PO TABS: 300.0000 mg | ORAL_TABLET | Freq: Every day | ORAL | Status: DC

## 2017-12-12 MED ORDER — ONDANSETRON HCL 4 MG/2ML IJ SOLN: 4.0000 mg | Freq: Four times a day (QID) | INTRAMUSCULAR | Status: DC | PRN

## 2017-12-12 MED ORDER — ONDANSETRON HCL 4 MG PO TABS: 4.0000 mg | ORAL_TABLET | Freq: Four times a day (QID) | ORAL | Status: DC | PRN

## 2017-12-12 MED ORDER — FENTANYL CITRATE (PF) 100 MCG/2ML IJ SOLN
INTRAMUSCULAR | Status: DC | PRN
  Administered 2017-12-12: 25 ug via INTRAVENOUS
  Administered 2017-12-12: 50 ug via INTRAVENOUS
  Administered 2017-12-12: 25 ug via INTRAVENOUS
  Administered 2017-12-12: 50 ug via INTRAVENOUS
  Administered 2017-12-12 (×2): 25 ug via INTRAVENOUS

## 2017-12-12 MED ORDER — NEOMYCIN-POLYMYXIN B GU 40-200000 IR SOLN
Status: DC | PRN
  Administered 2017-12-12: 4 mL
  Administered 2017-12-12: 12 mL

## 2017-12-12 MED ORDER — LIDOCAINE HCL (PF) 1 % IJ SOLN
INTRAMUSCULAR | Status: AC
  Filled 2017-12-12: qty 5

## 2017-12-12 MED ORDER — PANTOPRAZOLE SODIUM 40 MG PO TBEC: 40.0000 mg | DELAYED_RELEASE_TABLET | Freq: Every day | ORAL | Status: DC

## 2017-12-12 MED ORDER — COLCHICINE 0.6 MG PO TABS: 0.6000 mg | ORAL_TABLET | Freq: Every day | ORAL | Status: DC | PRN

## 2017-12-12 MED ORDER — TRANEXAMIC ACID 1000 MG/10ML IV SOLN
INTRAVENOUS | Status: DC | PRN
  Administered 2017-12-12: 1000 mg via TOPICAL

## 2017-12-12 MED ORDER — MAGNESIUM HYDROXIDE 400 MG/5ML PO SUSP: 30.0000 mL | Freq: Every day | ORAL | Status: DC | PRN

## 2017-12-12 MED ORDER — TRAMADOL HCL 50 MG PO TABS
50.0000 mg | ORAL_TABLET | Freq: Four times a day (QID) | ORAL | Status: DC | PRN
  Administered 2017-12-13: 50 mg via ORAL
  Filled 2017-12-12: qty 1

## 2017-12-12 MED ORDER — HYDROMORPHONE HCL 1 MG/ML IJ SOLN: 0.5000 mg | INTRAMUSCULAR | Status: DC | PRN

## 2017-12-12 MED ORDER — PHENYLEPHRINE HCL 10 MG/ML IJ SOLN
INTRAMUSCULAR | Status: DC | PRN
  Administered 2017-12-12: 200 ug via INTRAVENOUS

## 2017-12-12 MED ORDER — METOCLOPRAMIDE HCL 10 MG PO TABS: 5.0000 mg | ORAL_TABLET | Freq: Three times a day (TID) | ORAL | Status: DC | PRN

## 2017-12-12 MED ORDER — SODIUM CHLORIDE 0.9 % IV SOLN
INTRAVENOUS | Status: DC
  Administered 2017-12-12: 1000 mL via INTRAVENOUS
  Administered 2017-12-12: 23:00:00 via INTRAVENOUS

## 2017-12-12 MED ORDER — SODIUM CHLORIDE 0.9 % IJ SOLN
INTRAMUSCULAR | Status: AC
  Filled 2017-12-12: qty 50

## 2017-12-12 MED ORDER — BUPIVACAINE-EPINEPHRINE (PF) 0.5% -1:200000 IJ SOLN
INTRAMUSCULAR | Status: AC
  Filled 2017-12-12: qty 30

## 2017-12-12 MED ORDER — BISACODYL 10 MG RE SUPP: 10.0000 mg | Freq: Every day | RECTAL | Status: DC | PRN

## 2017-12-12 MED ORDER — HYDRALAZINE HCL 25 MG PO TABS
25.0000 mg | ORAL_TABLET | Freq: Three times a day (TID) | ORAL | Status: DC
  Administered 2017-12-12 – 2017-12-13 (×2): 25 mg via ORAL
  Filled 2017-12-12 (×2): qty 1

## 2017-12-12 MED ORDER — MIDAZOLAM HCL 2 MG/2ML IJ SOLN
INTRAMUSCULAR | Status: AC
  Administered 2017-12-12: 1 mg via INTRAVENOUS
  Filled 2017-12-12: qty 2

## 2017-12-12 MED ORDER — SODIUM CHLORIDE 0.9 % IV SOLN
INTRAVENOUS | Status: DC
  Administered 2017-12-12: 10:00:00 via INTRAVENOUS

## 2017-12-12 MED ORDER — DIPHENHYDRAMINE HCL 12.5 MG/5ML PO ELIX: 12.5000 mg | ORAL_SOLUTION | ORAL | Status: DC | PRN

## 2017-12-12 MED ORDER — METOPROLOL TARTRATE 25 MG PO TABS
25.0000 mg | ORAL_TABLET | Freq: Two times a day (BID) | ORAL | Status: DC
  Administered 2017-12-12: 25 mg via ORAL
  Filled 2017-12-12: qty 1

## 2017-12-12 MED ORDER — SUGAMMADEX SODIUM 200 MG/2ML IV SOLN
INTRAVENOUS | Status: DC | PRN
  Administered 2017-12-12: 200 mg via INTRAVENOUS

## 2017-12-12 MED ORDER — BUPIVACAINE-EPINEPHRINE (PF) 0.5% -1:200000 IJ SOLN
INTRAMUSCULAR | Status: DC | PRN
  Administered 2017-12-12: 30 mL via PERINEURAL

## 2017-12-12 MED ORDER — ALBUTEROL SULFATE HFA 108 (90 BASE) MCG/ACT IN AERS
INHALATION_SPRAY | RESPIRATORY_TRACT | Status: AC
  Filled 2017-12-12: qty 6.7

## 2017-12-12 MED ORDER — BUPIVACAINE HCL (PF) 0.5 % IJ SOLN
INTRAMUSCULAR | Status: AC
  Filled 2017-12-12: qty 10

## 2017-12-12 MED ORDER — KETOROLAC TROMETHAMINE 15 MG/ML IJ SOLN
7.5000 mg | Freq: Four times a day (QID) | INTRAMUSCULAR | Status: DC
  Administered 2017-12-12 – 2017-12-13 (×2): 7.5 mg via INTRAVENOUS
  Filled 2017-12-12 (×3): qty 1

## 2017-12-12 MED ORDER — ASPIRIN EC 81 MG PO TBEC
81.0000 mg | DELAYED_RELEASE_TABLET | Freq: Every day | ORAL | Status: DC
  Administered 2017-12-12 – 2017-12-13 (×2): 81 mg via ORAL
  Filled 2017-12-12: qty 1

## 2017-12-12 MED ORDER — BUPIVACAINE HCL (PF) 0.5 % IJ SOLN
INTRAMUSCULAR | Status: DC | PRN
  Administered 2017-12-12: 10 mL via PERINEURAL

## 2017-12-12 MED ORDER — OXYCODONE HCL 5 MG PO TABS
5.0000 mg | ORAL_TABLET | ORAL | Status: DC | PRN
  Administered 2017-12-12 – 2017-12-13 (×2): 5 mg via ORAL
  Filled 2017-12-12 (×2): qty 1

## 2017-12-12 MED ORDER — ONDANSETRON HCL 4 MG/2ML IJ SOLN
INTRAMUSCULAR | Status: DC | PRN
  Administered 2017-12-12: 4 mg via INTRAVENOUS

## 2017-12-12 MED ORDER — FENTANYL CITRATE (PF) 100 MCG/2ML IJ SOLN
50.0000 ug | Freq: Once | INTRAMUSCULAR | Status: AC
  Administered 2017-12-12: 50 ug via INTRAVENOUS

## 2017-12-12 MED ORDER — METFORMIN HCL 500 MG PO TABS
500.0000 mg | ORAL_TABLET | Freq: Two times a day (BID) | ORAL | Status: DC
  Administered 2017-12-12 – 2017-12-13 (×2): 500 mg via ORAL
  Filled 2017-12-12 (×2): qty 1

## 2017-12-12 MED ORDER — POTASSIUM CHLORIDE CRYS ER 20 MEQ PO TBCR
20.0000 meq | EXTENDED_RELEASE_TABLET | Freq: Three times a day (TID) | ORAL | Status: DC
  Administered 2017-12-12: 20 meq via ORAL
  Filled 2017-12-12: qty 1

## 2017-12-12 MED ORDER — ROCURONIUM BROMIDE 50 MG/5ML IV SOLN
INTRAVENOUS | Status: AC
  Filled 2017-12-12: qty 1

## 2017-12-12 MED ORDER — ENOXAPARIN SODIUM 40 MG/0.4ML ~~LOC~~ SOLN: 40.0000 mg | SUBCUTANEOUS | Status: DC

## 2017-12-12 MED ORDER — FAMOTIDINE 20 MG PO TABS
ORAL_TABLET | ORAL | Status: AC
  Administered 2017-12-12: 20 mg via ORAL
  Filled 2017-12-12: qty 1

## 2017-12-12 MED ORDER — FLEET ENEMA 7-19 GM/118ML RE ENEM: 1.0000 | ENEMA | Freq: Once | RECTAL | Status: DC | PRN

## 2017-12-12 MED ORDER — CEFAZOLIN SODIUM-DEXTROSE 2-4 GM/100ML-% IV SOLN
2.0000 g | Freq: Once | INTRAVENOUS | Status: AC
  Administered 2017-12-12: 2 g via INTRAVENOUS
  Filled 2017-12-12: qty 100

## 2017-12-12 MED ORDER — FENTANYL CITRATE (PF) 100 MCG/2ML IJ SOLN
INTRAMUSCULAR | Status: AC
  Administered 2017-12-12: 25 ug via INTRAVENOUS
  Filled 2017-12-12: qty 2

## 2017-12-12 MED ORDER — ROCURONIUM BROMIDE 100 MG/10ML IV SOLN
INTRAVENOUS | Status: DC | PRN
  Administered 2017-12-12: 50 mg via INTRAVENOUS
  Administered 2017-12-12: 30 mg via INTRAVENOUS

## 2017-12-12 MED ORDER — LIDOCAINE HCL (CARDIAC) 20 MG/ML IV SOLN
INTRAVENOUS | Status: DC | PRN
  Administered 2017-12-12: 100 mg via INTRAVENOUS

## 2017-12-12 MED ORDER — TRANEXAMIC ACID 1000 MG/10ML IV SOLN
INTRAVENOUS | Status: AC
  Filled 2017-12-12: qty 10

## 2017-12-12 MED ORDER — FENTANYL CITRATE (PF) 250 MCG/5ML IJ SOLN
INTRAMUSCULAR | Status: AC
  Filled 2017-12-12: qty 5

## 2017-12-12 MED ORDER — METOCLOPRAMIDE HCL 5 MG/ML IJ SOLN: 5.0000 mg | Freq: Three times a day (TID) | INTRAMUSCULAR | Status: DC | PRN

## 2017-12-12 MED ORDER — NEOMYCIN-POLYMYXIN B GU 40-200000 IR SOLN
Status: AC
  Filled 2017-12-12: qty 20

## 2017-12-12 MED ORDER — HYDROCHLOROTHIAZIDE 25 MG PO TABS: 25.0000 mg | ORAL_TABLET | Freq: Every day | ORAL | Status: DC

## 2017-12-12 MED ORDER — OLMESARTAN-AMLODIPINE-HCTZ 40-10-25 MG PO TABS: 1.0000 | ORAL_TABLET | Freq: Every day | ORAL | Status: DC

## 2017-12-12 MED ORDER — ACETAMINOPHEN 500 MG PO TABS
1000.0000 mg | ORAL_TABLET | Freq: Four times a day (QID) | ORAL | Status: DC
  Administered 2017-12-12 – 2017-12-13 (×3): 1000 mg via ORAL
  Filled 2017-12-12 (×3): qty 2

## 2017-12-12 MED ORDER — ACETAMINOPHEN 325 MG PO TABS: 325.0000 mg | ORAL_TABLET | Freq: Four times a day (QID) | ORAL | Status: DC | PRN

## 2017-12-12 MED ORDER — ONDANSETRON HCL 4 MG/2ML IJ SOLN: 4.0000 mg | Freq: Once | INTRAMUSCULAR | Status: DC | PRN

## 2017-12-12 MED ORDER — LIDOCAINE HCL (PF) 1 % IJ SOLN
INTRAMUSCULAR | Status: DC | PRN
  Administered 2017-12-12: 5 mL via SUBCUTANEOUS

## 2017-12-12 MED ORDER — INSULIN ASPART 100 UNIT/ML ~~LOC~~ SOLN
0.0000 [IU] | Freq: Three times a day (TID) | SUBCUTANEOUS | Status: DC
  Administered 2017-12-12: 3 [IU] via SUBCUTANEOUS
  Filled 2017-12-12: qty 1

## 2017-12-12 MED ORDER — AMLODIPINE BESYLATE 10 MG PO TABS
10.0000 mg | ORAL_TABLET | Freq: Every day | ORAL | Status: DC
  Administered 2017-12-12 – 2017-12-13 (×2): 10 mg via ORAL
  Filled 2017-12-12 (×2): qty 1

## 2017-12-12 MED ORDER — PROPOFOL 10 MG/ML IV BOLUS
INTRAVENOUS | Status: DC | PRN
  Administered 2017-12-12: 200 mg via INTRAVENOUS

## 2017-12-12 MED ORDER — PHENYLEPHRINE HCL 10 MG/ML IJ SOLN
INTRAVENOUS | Status: DC | PRN
  Administered 2017-12-12: 30 ug/min via INTRAVENOUS

## 2017-12-12 MED ORDER — KETOROLAC TROMETHAMINE 15 MG/ML IJ SOLN
15.0000 mg | Freq: Once | INTRAMUSCULAR | Status: AC
Start: 2017-12-12 — End: 2017-12-12
  Administered 2017-12-12: 15 mg via INTRAVENOUS

## 2017-12-12 MED ORDER — CEFAZOLIN SODIUM-DEXTROSE 2-4 GM/100ML-% IV SOLN
2.0000 g | Freq: Four times a day (QID) | INTRAVENOUS | Status: AC
  Administered 2017-12-12 – 2017-12-13 (×3): 2 g via INTRAVENOUS
  Filled 2017-12-12 (×3): qty 100

## 2017-12-12 MED ORDER — MIDAZOLAM HCL 2 MG/2ML IJ SOLN
1.0000 mg | Freq: Once | INTRAMUSCULAR | Status: AC
  Administered 2017-12-12: 1 mg via INTRAVENOUS

## 2017-12-12 MED ORDER — ALLOPURINOL 300 MG PO TABS
300.0000 mg | ORAL_TABLET | Freq: Every day | ORAL | Status: DC
  Administered 2017-12-13: 300 mg via ORAL
  Filled 2017-12-12: qty 1

## 2017-12-12 MED ORDER — PRAVASTATIN SODIUM 20 MG PO TABS
20.0000 mg | ORAL_TABLET | Freq: Every day | ORAL | Status: DC
  Administered 2017-12-13: 20 mg via ORAL
  Filled 2017-12-12: qty 1

## 2017-12-12 MED ORDER — ONDANSETRON HCL 4 MG/2ML IJ SOLN
INTRAMUSCULAR | Status: AC
  Filled 2017-12-12: qty 2

## 2017-12-12 MED ORDER — CEFAZOLIN SODIUM-DEXTROSE 2-3 GM-%(50ML) IV SOLR
INTRAVENOUS | Status: AC
  Filled 2017-12-12: qty 50

## 2017-12-12 MED ORDER — PROPOFOL 10 MG/ML IV BOLUS
INTRAVENOUS | Status: AC
  Filled 2017-12-12: qty 20

## 2017-12-12 SURGICAL SUPPLY — 68 items
BASEPLATE GLENOSPHERE 25 (Plate) ×2 IMPLANT
BASEPLATE GLENOSPHERE 25MM (Plate) ×1 IMPLANT
BEARING HUIMERAL E1 44X36 STD (Miscellaneous) ×1 IMPLANT
BIT DRILL TWIST 2.7 (BIT) ×2 IMPLANT
BIT DRILL TWIST 2.7MM (BIT) ×1
BLADE SAGITTAL WIDE XTHICK NO (BLADE) ×3 IMPLANT
CANISTER SUCT 1200ML W/VALVE (MISCELLANEOUS) ×3 IMPLANT
CANISTER SUCT 3000ML PPV (MISCELLANEOUS) ×6 IMPLANT
CHLORAPREP W/TINT 26ML (MISCELLANEOUS) ×3 IMPLANT
COOLER POLAR GLACIER W/PUMP (MISCELLANEOUS) ×3 IMPLANT
CRADLE LAMINECT ARM (MISCELLANEOUS) ×3 IMPLANT
DRAPE IMP U-DRAPE 54X76 (DRAPES) ×6 IMPLANT
DRAPE INCISE IOBAN 66X45 STRL (DRAPES) ×6 IMPLANT
DRAPE INCISE IOBAN 66X60 STRL (DRAPES) ×3 IMPLANT
DRAPE SHEET LG 3/4 BI-LAMINATE (DRAPES) ×6 IMPLANT
DRAPE TABLE BACK 80X90 (DRAPES) ×3 IMPLANT
DRSG OPSITE POSTOP 4X8 (GAUZE/BANDAGES/DRESSINGS) ×3 IMPLANT
ELECT BLADE 6.5 EXT (BLADE) ×3 IMPLANT
ELECT CAUTERY BLADE 6.4 (BLADE) ×3 IMPLANT
GLENOID SPHERE STD STRL 36MM (Orthopedic Implant) ×3 IMPLANT
GLOVE BIO SURGEON STRL SZ7 (GLOVE) ×6 IMPLANT
GLOVE BIO SURGEON STRL SZ7.5 (GLOVE) ×15 IMPLANT
GLOVE BIO SURGEON STRL SZ8 (GLOVE) ×12 IMPLANT
GLOVE BIOGEL PI IND STRL 8 (GLOVE) ×1 IMPLANT
GLOVE BIOGEL PI INDICATOR 8 (GLOVE) ×2
GLOVE INDICATOR 8.0 STRL GRN (GLOVE) ×3 IMPLANT
GOWN STRL REUS W/ TWL LRG LVL3 (GOWN DISPOSABLE) ×1 IMPLANT
GOWN STRL REUS W/ TWL XL LVL3 (GOWN DISPOSABLE) ×1 IMPLANT
GOWN STRL REUS W/TWL LRG LVL3 (GOWN DISPOSABLE) ×2
GOWN STRL REUS W/TWL XL LVL3 (GOWN DISPOSABLE) ×2
HOOD PEEL AWAY FLYTE STAYCOOL (MISCELLANEOUS) ×9 IMPLANT
HUMERAL BEARING E1 44X36 STD (Miscellaneous) ×3 IMPLANT
KIT STABILIZATION SHOULDER (MISCELLANEOUS) ×3 IMPLANT
KIT TURNOVER KIT A (KITS) ×3 IMPLANT
MASK FACE SPIDER DISP (MASK) ×3 IMPLANT
NDL SAFETY ECLIPSE 18X1.5 (NEEDLE) ×1 IMPLANT
NEEDLE HYPO 18GX1.5 SHARP (NEEDLE) ×2
NEEDLE HYPO 22GX1.5 SAFETY (NEEDLE) ×3 IMPLANT
NEEDLE SPNL 20GX3.5 QUINCKE YW (NEEDLE) ×3 IMPLANT
NS IRRIG 500ML POUR BTL (IV SOLUTION) ×3 IMPLANT
PACK ARTHROSCOPY SHOULDER (MISCELLANEOUS) ×3 IMPLANT
PAD WRAPON POLAR SHDR UNIV (MISCELLANEOUS) ×1 IMPLANT
PIN THREADED REVERSE (PIN) ×3 IMPLANT
PULSAVAC PLUS IRRIG FAN TIP (DISPOSABLE) ×3
SCREW BONE LOCKING 4.75X30X3.5 (Screw) ×3 IMPLANT
SCREW BONE LOCKING 4.75X35X3.5 (Screw) ×3 IMPLANT
SCREW BONE STRL 6.5MMX45MM (Screw) ×3 IMPLANT
SCREW NON-LOCK 4.75X20X3.5 (Screw) ×3 IMPLANT
SCREW NON-LOCK 4.75X30X3.5 (Screw) ×3 IMPLANT
SLING ULTRA II LG (MISCELLANEOUS) ×3 IMPLANT
SLING ULTRA II M (MISCELLANEOUS) IMPLANT
SOL .9 NS 3000ML IRR  AL (IV SOLUTION) ×2
SOL .9 NS 3000ML IRR UROMATIC (IV SOLUTION) ×1 IMPLANT
SPONGE LAP 18X18 5 PK (GAUZE/BANDAGES/DRESSINGS) ×3 IMPLANT
STAPLER SKIN PROX 35W (STAPLE) ×3 IMPLANT
STEM HUMERAL STRL 12MMX14MM (Stem) ×3 IMPLANT
SUT ETHIBOND 0 MO6 C/R (SUTURE) ×3 IMPLANT
SUT FIBERWIRE #2 38 BLUE 1/2 (SUTURE) ×12
SUT VIC AB 0 CT1 36 (SUTURE) ×6 IMPLANT
SUT VIC AB 2-0 CT1 27 (SUTURE) ×6
SUT VIC AB 2-0 CT1 TAPERPNT 27 (SUTURE) ×3 IMPLANT
SUTURE FIBERWR #2 38 BLUE 1/2 (SUTURE) ×4 IMPLANT
SYR 10ML LL (SYRINGE) ×3 IMPLANT
SYR 30ML LL (SYRINGE) ×6 IMPLANT
TIP FAN IRRIG PULSAVAC PLUS (DISPOSABLE) ×1 IMPLANT
TRAY FOLEY W/METER SILVER 16FR (SET/KITS/TRAYS/PACK) ×3 IMPLANT
TRAY HUM STD 44MM (Orthopedic Implant) ×3 IMPLANT
WRAPON POLAR PAD SHDR UNIV (MISCELLANEOUS) ×3

## 2017-12-12 NOTE — Anesthesia Procedure Notes (Signed)
Anesthesia Regional Block: Interscalene brachial plexus block   Pre-Anesthetic Checklist: ,, timeout performed, Correct Patient, Correct Site, Correct Laterality, Correct Procedure, Correct Position, site marked, Risks and benefits discussed,  Surgical consent,  Pre-op evaluation,  At surgeon's request and post-op pain management  Laterality: Right and Upper  Prep: chloraprep       Needles:  Injection technique: Single-shot  Needle Type: Stimiplex     Needle Length: 9cm  Needle Gauge: 22     Additional Needles:   Procedures:,,,, ultrasound used (permanent image in chart),,,,  Narrative:  Start time: 12/12/2017 10:10 AM End time: 12/12/2017 10:12 AM Injection made incrementally with aspirations every 5 mL.  Performed by: Personally  Anesthesiologist: Martha Clan, MD  Additional Notes: Functioning IV was confirmed and monitors were applied.  A 12mm 22ga Stimuplex needle was used. Sterile prep and drape,hand hygiene and sterile gloves were used.  Negative aspiration and negative test dose prior to incremental administration of local anesthetic. The patient tolerated the procedure well.

## 2017-12-12 NOTE — Anesthesia Postprocedure Evaluation (Signed)
Anesthesia Post Note  Patient: Kendarius Vigen  Procedure(s) Performed: REVERSE SHOULDER ARTHROPLASTY (Right Shoulder)  Patient location during evaluation: PACU Anesthesia Type: General Level of consciousness: awake and alert Pain management: pain level controlled Vital Signs Assessment: post-procedure vital signs reviewed and stable Respiratory status: spontaneous breathing, nonlabored ventilation, respiratory function stable and patient connected to nasal cannula oxygen Cardiovascular status: blood pressure returned to baseline and stable Postop Assessment: no apparent nausea or vomiting Anesthetic complications: no     Last Vitals:  Vitals:   12/12/17 1435 12/12/17 1609  BP:  (!) 149/74  Pulse: 91 92  Resp: 16 18  Temp:  (!) 36.4 C  SpO2: 96% 98%    Last Pain:  Vitals:   12/12/17 1609  TempSrc: Oral  PainSc:                  Martha Clan

## 2017-12-12 NOTE — Progress Notes (Signed)
Patient was admitted to room 134 from OR. Immobilizer to right should intact. Honeycomb placed CD&I. A&O x4. Oriented to room ,c all light, TV and bed controls

## 2017-12-12 NOTE — Op Note (Signed)
12/12/2017  1:29 PM  Patient:   Eric Browning  Pre-Op Diagnosis:   Massive irreparable rotator cuff tear with cuff arthropathy, right shoulder.  Post-Op Diagnosis:   Same  Procedure:   Reverse right total shoulder arthroplasty.  Surgeon:   Pascal Lux, MD  Assistant:   Arvilla Meres, RNFA  Anesthesia:   General endotracheal with an Exparel interscalene block placed preoperatively by the anesthesiologist.  Findings:   As above.  Complications:   None  EBL: 150 cc  Fluids:   900 cc crystalloid  UOP:   None  TT:   None  Drains:   None  Closure:   Staples  Implants:   All press-fit Biomet Comprehensive system with a #12 micro-humeral stem, a 44 mm humeral tray with a standard insert, and a mini-base plate with a 36 mm glenosphere.  Brief Clinical Note:   The patient is a 67 year old male with a long history of progressively worsening right shoulder pain. His symptoms have progressed despite medications, activity modification, injections, etc. His history and examination are consistent with impingement/tendinopathy with probable rotator cuff tear. Plain radiographs also demonstrated moderate degenerative changes. 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 The patient presents at this time for a reverse right total shoulder arthroplasty.  Procedure:   The patient underwent placement of an interscalene block using Exparel by the anesthesiologist in the preoperative holding area before being brought into the operating room and lain in the supine position. The patient then underwent general endotracheal intubation and anesthesia before the patient was repositioned in the beach chair position using the beach chair positioner. The right shoulder and upper extremity were prepped with ChloraPrep solution before being draped sterilely. Preoperative antibiotics were administered. A standard anterior approach to the shoulder was  made through an approximately 4-5 inch incision. The incision was carried down through the subcutaneous tissues to expose the deltopectoral fascia. The interval between the deltoid and pectoralis muscles was identified and this plane developed, retracting the cephalic vein laterally with the deltoid muscle. The conjoined tendon was identified. Its lateral margin was dissected and the Kolbel self-retraining retractor inserted. The "three sisters" were identified and cauterized. Bursal tissues were removed to improve visualization. The subscapularis tendon was released from its attachment to the lesser tuberosity 1 cm proximal to its insertion and several tagging sutures placed. It was noted that the superior half of the subscapularis tendon had torn and was retracted. The inferior capsule was released with care after identifying and protecting the axillary nerve. The proximal humeral cut was made at approximately 20 of retroversion using the extra-medullary guide.   Attention was redirected to the glenoid. The labrum was debrided circumferentially before the center of the glenoid was marked with electrocautery. The guidewire was drilled into the glenoid neck using the appropriate guide. After verifying its position, it was overreamed with the mini-baseplate reamer to create a flat surface. The permanent mini-baseplate was impacted into place. It was stabilized with a 45 x 6.5 mm central screw and four peripheral screws. Locking screws were placed superiorly and inferiorly while nonlocking screws were placed anteriorly and posteriorly. The permanent 36 mm glenosphere was then impacted into place and its Morse taper locking mechanism verified using manual distraction.  Attention was directed to the humeral side. The humeral canal was reamed sequentially beginning with the end-cutting reamer then progressing from a 4 mm reamer up to a 12 mm reamer. This provided excellent circumferential chatter.  The canal was  broached beginning with a #5 broach and progressing to a #12 broach. This was left in place and a trial reduction performed using the standard trial humeral platform. The arm demonstrated excellent range of motion as the hand could be brought across the chest to the opposite shoulder and brought to the top of the patient's head and to the patient's ear. The shoulder appeared stable throughout this range of motion. The joint was dislocated and the trial components removed. The permanent #12 micro-stem was impacted into place with care taken to maintain the appropriate version. The permanent 44 mm humeral platform with the standard insert was put together on the back table and impacted into place. Again, the [facility] taper locking mechanism was verified using manual distraction. The shoulder was relocated using two finger pressure and again placed through a range of motion with the findings as described above.  The wound was copiously irrigated with bacitracin saline solution using the jet lavage system before a total of 30 cc of 0.5% Sensorcaine with epinephrine was injected into the pericapsular and peri-incisional tissues to help with postoperative analgesia. The subscapularis tendon remnant was reapproximated using #2 FiberWire interrupted sutures. The deltopectoral interval was closed using #0 Vicryl interrupted sutures before the subcutaneous tissues were closed using 2-0 Vicryl interrupted sutures. The skin was closed using staples. Prior to closing the skin, 1 g of transexemic acid in 10 cc of normal saline was injected intra-articularly to help with postoperative bleeding. A sterile occlusive dressing was applied to the wound before the arm was placed into a shoulder immobilizer with an abduction pillow. A Polar Care system also was applied to the shoulder. The patient was then transferred back to a hospital bed before being awakened, extubated, and returned to the recovery room in satisfactory condition after  tolerating the procedure well.

## 2017-12-12 NOTE — H&P (Signed)
Paper H&P to be scanned into permanent record. H&P reviewed and patient re-examined. No changes. 

## 2017-12-12 NOTE — Anesthesia Procedure Notes (Signed)
Procedure Name: Intubation Performed by: Bernardo Heater, CRNA Pre-anesthesia Checklist: Patient identified, Emergency Drugs available, Suction available and Patient being monitored Patient Re-evaluated:Patient Re-evaluated prior to induction Oxygen Delivery Method: Circle system utilized Preoxygenation: Pre-oxygenation with 100% oxygen Induction Type: IV induction Laryngoscope Size: Mac and 3 Grade View: Grade I Tube size: 7.0 mm Number of attempts: 1 Placement Confirmation: ETT inserted through vocal cords under direct vision,  positive ETCO2 and breath sounds checked- equal and bilateral Secured at: 22 cm Tube secured with: Tape Dental Injury: Teeth and Oropharynx as per pre-operative assessment

## 2017-12-12 NOTE — Anesthesia Preprocedure Evaluation (Signed)
Anesthesia Evaluation   Patient awake    Reviewed: Allergy & Precautions, NPO status , Patient's Chart, lab work & pertinent test results, reviewed documented beta blocker date and time   History of Anesthesia Complications Negative for: history of anesthetic complications  Airway Mallampati: II  TM Distance: >3 FB Neck ROM: Full    Dental  (+) Lower Dentures, Upper Dentures   Pulmonary neg sleep apnea, neg COPD, neg recent URI, former smoker,    breath sounds clear to auscultation- rhonchi (-) wheezing      Cardiovascular Exercise Tolerance: Good hypertension, Pt. on medications and Pt. on home beta blockers (-) angina(-) CAD, (-) Past MI, (-) Cardiac Stents and (-) CABG (-) dysrhythmias + Valvular Problems/Murmurs  Rhythm:Regular Rate:Normal - Systolic murmurs and - Diastolic murmurs    Neuro/Psych negative neurological ROS  negative psych ROS   GI/Hepatic negative GI ROS, Neg liver ROS,   Endo/Other  diabetes, Type 2, Oral Hypoglycemic Agents  Renal/GU negative Renal ROS     Musculoskeletal   Abdominal (+) + obese,   Peds  Hematology negative hematology ROS (+)   Anesthesia Other Findings Past Medical History: No date: Diabetes ([place]) No date: Gout No date: High cholesterol No date: Hypertension   Reproductive/Obstetrics                             Anesthesia Physical  Anesthesia Plan  ASA: III  Anesthesia Plan: General   Post-op Pain Management:  Regional for Post-op pain   Induction: Intravenous  PONV Risk Score and Plan: 2 and Ondansetron and Dexamethasone  Airway Management Planned: Oral ETT  Additional Equipment:   Intra-op Plan:   Post-operative Plan: Extubation in OR  Informed Consent: I have reviewed the patients History and Physical, chart, labs and discussed the procedure including the risks, benefits and alternatives for the proposed anesthesia with the  patient or authorized representative who has indicated his/her understanding and acceptance.   Dental advisory given  Plan Discussed with: CRNA and Anesthesiologist  Anesthesia Plan Comments:         Lab Results  Component Value Date   WBC 11.1 (H) 12/04/2017   HGB 12.2 (L) 12/12/2017   HCT 36.0 (L) 12/12/2017   MCV 84.6 12/04/2017   PLT 345 12/04/2017    Anesthesia Quick Evaluation

## 2017-12-12 NOTE — Anesthesia Post-op Follow-up Note (Signed)
Anesthesia QCDR form completed.        

## 2017-12-12 NOTE — Transfer of Care (Signed)
Immediate Anesthesia Transfer of Care Note  Patient: Eric Browning  Procedure(s) Performed: REVERSE SHOULDER ARTHROPLASTY (Right Shoulder)  Patient Location: PACU  Anesthesia Type:General  Level of Consciousness: awake and patient cooperative  Airway & Oxygen Therapy: Patient Spontanous Breathing and Patient connected to nasal cannula oxygen  Post-op Assessment: Report given to RN and Post -op Vital signs reviewed and stable  Post vital signs: Reviewed and stable  Last Vitals:  Vitals Value Taken Time  BP    Temp    Pulse 91 12/12/2017  1:35 PM  Resp 16 12/12/2017  1:35 PM  SpO2 96 % 12/12/2017  1:35 PM  Vitals shown include unvalidated device data.  Last Pain:  Vitals:   12/12/17 1039  TempSrc:   PainSc: 0-No pain         Complications: No apparent anesthesia complications

## 2017-12-13 ENCOUNTER — Encounter: Payer: Self-pay | Admitting: Surgery

## 2017-12-13 LAB — CBC WITH DIFFERENTIAL/PLATELET
Basophils Absolute: 0 10*3/uL (ref 0–0.1)
Basophils Relative: 0 %
Eosinophils Absolute: 0.1 10*3/uL (ref 0–0.7)
Eosinophils Relative: 2 %
HEMATOCRIT: 31.4 % — AB (ref 40.0–52.0)
HEMOGLOBIN: 10.2 g/dL — AB (ref 13.0–18.0)
LYMPHS ABS: 1.4 10*3/uL (ref 1.0–3.6)
Lymphocytes Relative: 15 %
MCH: 27.9 pg (ref 26.0–34.0)
MCHC: 32.5 g/dL (ref 32.0–36.0)
MCV: 86 fL (ref 80.0–100.0)
MONOS PCT: 11 %
Monocytes Absolute: 1 10*3/uL (ref 0.2–1.0)
NEUTROS ABS: 6.4 10*3/uL (ref 1.4–6.5)
NEUTROS PCT: 72 %
Platelets: 292 10*3/uL (ref 150–440)
RBC: 3.65 MIL/uL — ABNORMAL LOW (ref 4.40–5.90)
RDW: 16.7 % — ABNORMAL HIGH (ref 11.5–14.5)
WBC: 8.9 10*3/uL (ref 3.8–10.6)

## 2017-12-13 LAB — BASIC METABOLIC PANEL
Anion gap: 7 (ref 5–15)
BUN: 18 mg/dL (ref 6–20)
CHLORIDE: 104 mmol/L (ref 101–111)
CO2: 28 mmol/L (ref 22–32)
CREATININE: 1.05 mg/dL (ref 0.61–1.24)
Calcium: 8.9 mg/dL (ref 8.9–10.3)
GFR calc Af Amer: >60 mL/min (ref >60)
GFR calc non Af Amer: >60 mL/min (ref >60)
GLUCOSE: 114 mg/dL — AB (ref 65–99)
Potassium: 3.5 mmol/L (ref 3.5–5.1)
Sodium: 139 mmol/L (ref 135–145)

## 2017-12-13 LAB — GLUCOSE, CAPILLARY: Glucose-Capillary: 120 mg/dL — ABNORMAL HIGH (ref 65–99)

## 2017-12-13 MED ORDER — ENOXAPARIN SODIUM 40 MG/0.4ML ~~LOC~~ SOLN: 40.0000 mg | SUBCUTANEOUS | 0 refills | Status: DC

## 2017-12-13 MED ORDER — TRAMADOL HCL 50 MG PO TABS: 50.0000 mg | ORAL_TABLET | ORAL | 0 refills | Status: DC | PRN

## 2017-12-13 MED ORDER — OXYCODONE HCL 5 MG PO TABS: 5.0000 mg | ORAL_TABLET | ORAL | 0 refills | Status: DC | PRN

## 2017-12-13 MED ORDER — ASPIRIN EC 325 MG PO TBEC: 325.0000 mg | DELAYED_RELEASE_TABLET | Freq: Every day | ORAL | 0 refills | Status: DC

## 2017-12-13 NOTE — Care Management Note (Signed)
Case Management Note  Patient Details  Name: Dayyan Krist MRN: [id number] Date of Birth: Jul 18, 1951  Subjective/Objective:  Met with patient and his wife at bedside to discuss discharge planning. Offered choice of home health agencies. Referral to [facility] for [place] and [place].  No DME needs. Patient will discharge on ASA.    Action/Plan: [facility] for HHPT and [place]  Expected Discharge Date:  12/13/17               Expected Discharge Plan:  [place]  In-House Referral:     Discharge planning Services  CM Consult  Post Acute Care Choice:  Home Health Choice offered to:  Patient  DME Arranged:    DME Agency:     HH Arranged:  OT, PT [place] Agency:  [facility] (formerly [employer])  Status of Service:  Completed, signed off  If discussed at [employer], dates discussed:    Additional Comments:  Jolly Mango, RN 12/13/2017, 10:47 AM

## 2017-12-13 NOTE — Progress Notes (Signed)
   Subjective: 1 Day Post-Op Procedure(s) (LRB): REVERSE SHOULDER ARTHROPLASTY (Right) Patient reports pain as 5 on 0-10 scale.   Patient is well, and has had no acute complaints or problems We will start therapy today.  Plan is to go Home after hospital stay. no nausea and no vomiting Patient denies any chest pains or shortness of breath. Objective: Vital signs in last 24 hours: Temp:  [96.9 F (36.1 C)-98.1 F (36.7 C)] 97.7 F (36.5 C) (04/17 0433) Pulse Rate:  [58-97] 58 (04/17 0433) Resp:  [14-20] 19 (04/17 0433) BP: (146-183)/(52-93) 146/71 (04/17 0433) SpO2:  [92 %-100 %] 100 % (04/17 0433) Weight:  [96.2 kg (212 lb)] 96.2 kg (212 lb) (04/16 0909) well approximated incision Heels are non tender and elevated off the bed using rolled towels Intake/Output from previous day: 04/16 0701 - 04/17 0700 In: 2655 [P.O.:360; I.V.:2095; IV Piggyback:200] Out: 950 [Urine:800; Blood:150] Intake/Output this shift: Total I/O In: 1295 [I.V.:1095; IV Piggyback:200] Out: 800 [Urine:800]  Recent Labs    12/12/17 0917  HGB 12.2*   Recent Labs    12/12/17 0917  HCT 36.0*   Recent Labs    12/12/17 0917  NA 144  K 3.3*  GLUCOSE 126*   No results for input(s): LABPT, INR in the last 72 hours.  EXAM General - Patient is Alert, Appropriate and Oriented Extremity - Neurologically intact Neurovascular intact Sensation intact distally Intact pulses distally Dorsiflexion/Plantar flexion intact No cellulitis present Compartment soft Dressing - dressing C/D/I Motor Function - intact, moving foot and toes well on exam.    Past Medical History:  Diagnosis Date  . Arthritis    hands  . Diabetes ([place])   . Gout   . Heart murmur    not treated for this  . High cholesterol   . Hypertension     Assessment/Plan: 1 Day Post-Op Procedure(s) (LRB): REVERSE SHOULDER ARTHROPLASTY (Right) Active Problems:   Status post reverse total shoulder replacement, right  Estimated body  mass index is 33.71 kg/m as calculated from the following:   Height as of this encounter: 5' 6.5" (1.689 m).   Weight as of this encounter: 96.2 kg (212 lb). Advance diet Up with therapy D/C IV fluids Discharge home with home health  Labs: Were reviewed. DVT Prophylaxis - Lovenox, Foot Pumps and TED hose Weight-Bearing as tolerated to both leg D/C O2 and Pulse OX and try on Room Air Patient may be discharged to home once he has physical therapy and Occupational Therapy Please give the patient  Jillyn Ledger. [place] [facility] 12/13/2017, 6:48 AM

## 2017-12-13 NOTE — Evaluation (Signed)
Physical Therapy Evaluation Patient Details Name: Eric Browning MRN: [id number] DOB: 03-14-1951 Today's Date: 12/13/2017   History of Present Illness  Pt is a 67 yo male diagnosed with a massive irreparable rotator cuff tear with cuff arthropathy, right shoulder, and is s/p elective reverse right total shoulder arthroplasty.  PMH includes: HTN, and DM II.    Clinical Impression  Pt presents with deficits in RUE strength and mild deficits with transfers, mobility, gait, and balance.  Pt required extra time and effort during bed mobility tasks and transfers with slow, cautious movements.  Pt able to amb 1 x 50' and 1 x 200' with very slow, cautions steps with mild drifting left/right during dynamic gait training but no LOB.  Pt's SpO2 dropped to 89% after amb from baseline of 96%, nursing notified.  Pt will benefit from HHPT services upon discharge to safely address above deficits for decreased caregiver assistance and eventual return to PLOF.      Follow Up Recommendations Home health PT    Equipment Recommendations  None recommended by PT    Recommendations for Other Services       Precautions / Restrictions Precautions Precautions: Fall;Shoulder Shoulder Interventions: Shoulder sling/immobilizer;Shoulder abduction pillow;At all times Precaution Booklet Issued: Yes (comment) Restrictions Weight Bearing Restrictions: Yes RUE Weight Bearing: Non weight bearing      Mobility  Bed Mobility Overal bed mobility: Modified Independent(Simultaneous filing. User may not have seen previous data.) Bed Mobility: Supine to Sit;Sit to Supine     Supine to sit: Min assist Sit to supine: Min assist   General bed mobility comments: Slightly increased effort but no physical assistance required(Simultaneous filing. User may not have seen previous data.)  Transfers Overall transfer level: Needs assistance(Simultaneous filing. User may not have seen previous data.) Equipment used:  None(Simultaneous filing. User may not have seen previous data.) Transfers: Sit to/from Stand(Simultaneous filing. User may not have seen previous data.) Sit to Stand: Supervision(Simultaneous filing. User may not have seen previous data.)         General transfer comment: Slow and cautious during sit to/from stands(Simultaneous filing. User may not have seen previous data.)  Ambulation/Gait Ambulation/Gait assistance: Supervision Ambulation Distance (Feet): 200 Feet Assistive device: None Gait Pattern/deviations: Step-through pattern;Decreased step length - right;Decreased step length - left   Gait velocity interpretation: <1.31 ft/sec, indicative of household ambulator General Gait Details: Slow, cautious cadence with gait with some minor drifting left/right during dynamic gait training with head turns but no LOB  Stairs            Wheelchair Mobility    Modified Rankin (Stroke Patients Only)       Balance Overall balance assessment: Mild deficits observed, not formally tested                                           Pertinent Vitals/Pain Pain Assessment: 0-10 Pain Score: 5  Pain Location: R shoulder Pain Descriptors / Indicators: Aching Pain Intervention(s): Premedicated before session;Monitored during session    Home Living Family/patient expects to be discharged to:: Private residence Living Arrangements: Spouse/significant other Available Help at Discharge: Family;Available 24 hours/day Type of Home: House Home Access: Ramped entrance     Home Layout: One level Home Equipment: Toilet riser;Bedside commode;Walker - 2 wheels;Cane - single point      Prior Function Level of Independence: Independent  Comments: Pt indep with mobility with no AD, ADL, no falls, and 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     Hand Dominance   Dominant Hand: Right    Extremity/Trunk Assessment   Upper  Extremity Assessment Upper Extremity Assessment: Defer to OT evaluation RUE Deficits / Details: RUE immobilized, full ROM in hand, wrist, and elbow, impaired sensation (block hasn't fully resolved) RUE: Unable to fully assess due to immobilization RUE Sensation: decreased light touch RUE Coordination: decreased gross motor    Lower Extremity Assessment Lower Extremity Assessment: Overall WFL for tasks assessed    Cervical / Trunk Assessment Cervical / Trunk Assessment: Normal  Communication   Communication: No difficulties  Cognition Arousal/Alertness: Awake/alert Behavior During Therapy: WFL for tasks assessed/performed Overall Cognitive Status: Within Functional Limits for tasks assessed                                        General Comments      Exercises Other Exercises Other Exercises: Static balance training with multiple foot positions and combinations of eyes open/closed and head still/head turns with fair to good stability Other Exercises: pt/spouse/sister instructed in adaptive strategies and home/routines modifications to minimize falls risk and maximize independence   Assessment/Plan    PT Assessment Patient needs continued PT services  PT Problem List Decreased balance;Decreased mobility;Decreased strength       PT Treatment Interventions DME instruction;Gait training;Stair training;Functional mobility training;Balance training;Therapeutic exercise;Therapeutic activities;Patient/family education    PT Goals (Current goals can be found in the Care Plan section)  Acute Rehab PT Goals Patient Stated Goal: to go home and recover PT Goal Formulation: With patient Time For Goal Achievement: 12/26/17 Potential to Achieve Goals: Good    Frequency BID   Barriers to discharge        Co-evaluation               AM-PAC PT "6 Clicks" Daily Activity  Outcome Measure Difficulty turning over in bed (including adjusting bedclothes, sheets and  blankets)?: A Little Difficulty moving from lying on back to sitting on the side of the bed? : A Little Difficulty sitting down on and standing up from a chair with arms (e.g., wheelchair, bedside commode, etc,.)?: A Little Help needed moving to and from a bed to chair (including a wheelchair)?: None Help needed walking in hospital room?: None Help needed climbing 3-5 steps with a railing? : A Little 6 Click Score: 20    End of Session Equipment Utilized During Treatment: Gait belt Activity Tolerance: Patient tolerated treatment well Patient left: in chair;with chair alarm set;with family/visitor present;with call bell/phone within reach Nurse Communication: Mobility status PT Visit Diagnosis: Unsteadiness on feet (R26.81);Difficulty in walking, not elsewhere classified (R26.2);Muscle weakness (generalized) (M62.81)    Time: 1443-1540 PT Time Calculation (min) (ACUTE ONLY): 30 min   Charges:   PT Evaluation $PT Eval Low Complexity: 1 Low PT Treatments $Gait Training: 8-22 mins   PT G Codes:        DRoyetta Asal PT, DPT 12/13/17, 11:26 AM

## 2017-12-13 NOTE — Discharge Instructions (Signed)
Abduction pillow and sling is to be worn at all times No shower until staples are removed Lovenox injections for 2 weeks. Follow-up [facility] as scheduled.  Sooner if any problems Regular diet Resume medication as outlined in the discharge instructions

## 2017-12-13 NOTE — Evaluation (Signed)
Occupational Therapy Evaluation Patient Details Name: Eric Browning MRN: [id number] DOB: 1950-10-07 Today's Date: 12/13/2017    History of Present Illness Pt is a 67 yo male diagnosed with a massive irreparable rotator cuff tear with cuff arthropathy, right shoulder, and is s/p elective reverse right total shoulder arthroplasty.  PMH includes: HTN, and DM II.   Clinical Impression   Patient was evaluated this date by OT, POD#1 from reverse R TSA. 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 Patient has orders for RUE immobilized and will be NWBing per MD. Patient presents with decreased strength/ROM, R shoulder pain, and impaired sensation resulting in decreased ability to perform self care tasks. Pt/spouse/sister instructed in shoulder precautions, strategies for ADL tasks, polar care, compression stocking, and immobilizer/sling management. Pt will benefit from continued skilled OT services to address these limitations and improve independence in daily tasks to return home with family assist. Recommend OT [place] services.    Follow Up Recommendations  Home health OT    Equipment Recommendations  None recommended by OT    Recommendations for Other Services       Precautions / Restrictions Precautions Precautions: Fall;Shoulder Shoulder Interventions: Shoulder sling/immobilizer;Shoulder abduction pillow;At all times Precaution Booklet Issued: Yes (comment) Restrictions Weight Bearing Restrictions: Yes RUE Weight Bearing: Non weight bearing      Mobility Bed Mobility Overal bed mobility: min assist  Bed Mobility: Supine to Sit;Sit to Supine     Supine to sit: Min assist Sit to supine: Min assist     Transfers Overall transfer level: Needs assistance Equipment used: None Transfers: Sit to/from Stand Sit to Stand: Supervision              Balance Overall balance assessment: Mild deficits observed, not formally tested                                         ADL either performed or assessed with clinical judgement   ADL Overall ADL's : Needs assistance/impaired Eating/Feeding: Modified independent;Sitting   Grooming: Standing;Minimal assistance;With caregiver independent assisting Grooming Details (indicate cue type and reason): pt/spouse instructed in underarm grooming techniques  Upper Body Bathing: Minimal assistance;With caregiver independent assisting Upper Body Bathing Details (indicate cue type and reason): pt/spouse instructed in underarm bathing techniques  Lower Body Bathing: Sit to/from stand;Minimal assistance;With caregiver independent assisting Lower Body Bathing Details (indicate cue type and reason): pt/spouse instructed in seated sponge bath while unable to get in tub and take full shower Upper Body Dressing : Sitting;With caregiver independent assisting Upper Body Dressing Details (indicate cue type and reason): pt/spouse instructed in hemi techniques for UB dressing   Lower Body Dressing: Sit to/from stand;Minimal assistance;With caregiver independent assisting Lower Body Dressing Details (indicate cue type and reason): pt/spouse instructed in compression stocking mgt and use of LH shoe horn to improve donning shoes Toilet Transfer: Min guard;Ambulation;Regular Toilet           Functional mobility during ADLs: Min guard       Vision Baseline Vision/History: No visual deficits Patient Visual Report: No change from baseline Vision Assessment?: No apparent visual deficits     Perception     Praxis      Pertinent Vitals/Pain Pain Assessment: 0-10 Pain Score: 5  Pain Location: R shoulder  Pain Descriptors / Indicators: Aching Pain Intervention(s): Premedicated before session;Monitored during session     Hand Dominance Right   Extremity/Trunk Assessment  Upper Extremity Assessment Upper Extremity Assessment: Defer to OT evaluation RUE Deficits / Details: RUE immobilized, full ROM in hand, wrist, and elbow, impaired sensation (block hasn't fully resolved) RUE: Unable to fully assess due to immobilization RUE Sensation: decreased light touch RUE Coordination: decreased gross motor   Lower Extremity Assessment Lower Extremity Assessment: Overall WFL for tasks assessed   Cervical / Trunk Assessment Cervical / Trunk Assessment: Normal   Communication Communication Communication: No difficulties   Cognition Arousal/Alertness: Awake/alert Behavior During Therapy: WFL for tasks assessed/performed Overall Cognitive Status: Within Functional Limits for tasks assessed                                     General Comments       Exercises Other Exercises Other Exercises: pt/spouse/pt's sister instructed in polar care mgt and shoulder immobilizer/sling mgt including positioning, wear schedule, and how to don/doff Other Exercises: pt/spouse/sister instructed in adaptive strategies and home/routines modifications to minimize falls risk and maximize independence   Shoulder Instructions      Home Living Family/patient expects to be discharged to:: Private residence Living Arrangements: Spouse/significant other Available Help at Discharge: Family;Available 24 hours/day Type of Home: House Home Access: Ramped entrance     Home Layout: One level     Bathroom Shower/Tub: Occupational psychologist: Handicapped height     Home Equipment: Toilet riser;Bedside commode;Walker - 2 wheels;Cane - single point          Prior Functioning/Environment Level of Independence: Independent        Comments: Pt indep with mobility with no AD, ADL, no falls, and 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        OT Problem List: Decreased range of motion;Decreased strength;Decreased knowledge  of use of DME or AE;Decreased knowledge of precautions;Pain;Impaired sensation;Impaired UE functional use      OT Treatment/Interventions: Self-care/ADL training;Balance training;Therapeutic exercise;Therapeutic activities;DME and/or AE instruction;Patient/family education    OT Goals(Current goals can be found in the care plan section) Acute Rehab OT Goals Patient Stated Goal: to go home and recover OT Goal Formulation: All assessment and education complete, DC therapy ADL Goals Pt Will Perform Upper Body Dressing: with min assist;with caregiver independent in assisting(using learned hemi techniques and no shoulder AROM) Pt/caregiver will Perform Home Exercise Program: Right Upper extremity(AROM hand, wrist, and elbow) Additional ADL Goal #1: Pt/spouse will verbalize understanding of polar care and shoulder immobilizer/sling for wear schedule, positioning, and mgt  OT Frequency: Min 1X/week   Barriers to D/C:            Co-evaluation              AM-PAC PT "6 Clicks" Daily Activity     Outcome Measure Help from another person eating meals?: None Help from another person taking care of personal grooming?: A Little Help from another person toileting, which includes using toliet, bedpan, or urinal?: A Little Help from another person bathing (including washing, rinsing, drying)?: A Little Help from another person to put on and taking off regular upper body clothing?: A Little Help from another person to put on and taking off regular lower body clothing?: A Little 6 Click Score: 19   End of Session Equipment  Utilized During Treatment: Gait belt  Activity Tolerance: Patient tolerated treatment well Patient left: in bed;with call bell/phone within reach;with family/visitor present  OT Visit Diagnosis: Other abnormalities of gait and mobility (R26.89)                Time: 1610-9604 OT Time Calculation (min): 65 min Charges:  OT General Charges $OT Visit: 1 Visit OT  Evaluation $OT Eval Low Complexity: 1 Low OT Treatments $Self Care/Home Management : 38-52 mins $Therapeutic Exercise: 8-22 mins  Jeni Salles, MPH, MS, OTR/L ascom [phone] 12/13/17, 11:29 AM

## 2017-12-13 NOTE — Discharge Summary (Addendum)
Physician Discharge Summary  Patient ID: Eric Browning MRN: [id number] DOB/AGE: Nov 17, 1950 67 y.o.  Admit date: 12/12/2017 Discharge date: 12/13/2017  Admission Diagnoses:  complete tear of right rotator cuff,primary osteoarthritis of right shoulder   Discharge Diagnoses: Patient Active Problem List   Diagnosis Date Noted  . 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 Chest pain 12/09/2016  . Status post total right knee replacement using cement 05/12/2016    Past Medical History:  Diagnosis Date  . Arthritis    hands  . Diabetes ([place])   . Gout   . Heart murmur    not treated for this  . High cholesterol   . Hypertension      Transfusion: No transfusions during this admission   Consultants (if any):   Discharged Condition: Improved  Hospital Course: Eric Browning is an 67 y.o. male who was admitted 12/12/2017 with a diagnosis of rotator cuff tear with degenerative arthrosis right shoulder and went to the operating room on 12/12/2017 and underwent the above named procedures.    Surgeries:Procedure(s): REVERSE SHOULDER ARTHROPLASTY on 12/12/2017  Pre-Op Diagnosis:   Massive irreparable rotator cuff tear with cuff arthropathy, right shoulder.  Post-Op Diagnosis:   Same  Procedure:   Reverse right total shoulder arthroplasty.  Surgeon:   Pascal Lux, MD  Assistant:   Arvilla Meres, RNFA  Anesthesia:   General endotracheal with an Exparel interscalene block placed preoperatively by the anesthesiologist.  Findings:   As above.  Complications:   None  EBL: 150 cc  Fluids:   900 cc crystalloid  UOP:   None  TT:   None  Drains:   None  Closure:   Staples  Implants:   All press-fit Biomet Comprehensive system with a #12 micro-humeral stem, a 44 mm humeral tray with a standard insert, and a mini-base plate with a 36 mm glenosphere.  Brief Clinical Note:   The patient is a 67 year old male with a long history of  progressively worsening right shoulder pain. His symptoms have progressed despite medications, activity modification, injections, etc. His history and examination are consistent with impingement/tendinopathy with probable rotator cuff tear. Plain radiographs also demonstrated moderate degenerative changes. 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 The patient presents at this time for a reverse right total shoulder arthroplasty.   Patient tolerated the surgery well. No complications .Patient was taken to PACU where she was stabilized and then transferred to the orthopedic floor.  Patient started on Lovenox 40 mg q 24 hrs. Foot pumps applied bilaterally at 80 mm hgb. Heels elevated off bed with rolled towels. No evidence of DVT. Calves non tender. Negative Homan. Physical therapy started on day #1 for gait training and transfer with OT starting on  day #1 for ADL and assisted devices. Patient has done well with therapy.   Patient's IV And Foley were discontinued on day #1 .   He was given perioperative antibiotics:  Anti-infectives (From admission, onward)   Start     Dose/Rate Route Frequency Ordered Stop   12/12/17 1700  ceFAZolin (ANCEF) IVPB 2g/100 mL premix     2 g 200 mL/hr over 30 Minutes Intravenous Every 6 hours 12/12/17 1551 12/13/17 0558   12/12/17 0909  ceFAZolin (ANCEF) 2-3 GM-%(50ML) IVPB SOLR    Note to Pharmacy:  Otho Darner   : cabinet override      12/12/17 0909 12/12/17 2129   12/12/17 0530  ceFAZolin (ANCEF)  IVPB 2g/100 mL premix     2 g 200 mL/hr over 30 Minutes Intravenous  Once 12/12/17 0524 12/12/17 1135    .  He was fitted with AV 1 compression foot pump devices, instructed on heel pumps, early ambulation, and fitted with TED stockings bilaterally for DVT prophylaxis.  He benefited maximally from the hospital stay and there were no complications.    Recent vital signs:  Vitals:   12/12/17 2333  12/13/17 0433  BP: (!) 160/70 (!) 146/71  Pulse: 76 (!) 58  Resp: 19 19  Temp: 97.6 F (36.4 C) 97.7 F (36.5 C)  SpO2: 98% 100%    Recent laboratory studies:  Lab Results  Component Value Date   HGB 10.2 (L) 12/13/2017   HGB 12.2 (L) 12/12/2017   HGB 11.2 (L) 12/04/2017   Lab Results  Component Value Date   WBC 8.9 12/13/2017   PLT 292 12/13/2017   Lab Results  Component Value Date   INR 0.91 12/04/2017   Lab Results  Component Value Date   NA 139 12/13/2017   K 3.5 12/13/2017   CL 104 12/13/2017   CO2 28 12/13/2017   BUN 18 12/13/2017   CREATININE 1.05 12/13/2017   GLUCOSE 114 (H) 12/13/2017    Discharge Medications:   Allergies as of 12/13/2017   No Known Allergies     Medication List    STOP taking these medications   etodolac 200 MG capsule Commonly known as:  LODINE   ibuprofen 800 MG tablet Commonly known as:  ADVIL,MOTRIN   ketorolac 10 MG tablet Commonly known as:  TORADOL     TAKE these medications   allopurinol 300 MG tablet Commonly known as:  ZYLOPRIM Take 300 mg by mouth daily.   aspirin EC 325 MG tablet Take 1 tablet (325 mg total) by mouth daily. What changed:    medication strength  how much to take   colchicine 0.6 MG tablet Take 0.6 mg by mouth daily as needed. For gout flare-ups   cyclobenzaprine 10 MG tablet Commonly known as:  FLEXERIL Take 1 tablet (10 mg total) by mouth 3 (three) times daily as needed for muscle spasms.   diazepam 2 MG tablet Commonly known as:  VALIUM Take 1 tablet (2 mg total) by mouth every 8 (eight) hours as needed for muscle spasms.   hydrALAZINE 25 MG tablet Commonly known as:  APRESOLINE Take 25 mg by mouth 3 (three) times daily.   HYDROcodone-acetaminophen 5-325 MG tablet Commonly known as:  NORCO Take 1 tablet by mouth every 6 (six) hours as needed for moderate pain.   metFORMIN 500 MG tablet Commonly known as:  GLUCOPHAGE Take 500 mg by mouth 2 (two) times daily.   metoprolol  tartrate 25 MG tablet Commonly known as:  LOPRESSOR Take 25 mg by mouth 2 (two) times daily.   orphenadrine 100 MG tablet Commonly known as:  NORFLEX Take 1 tablet (100 mg total) by mouth 2 (two) times daily.   OVER THE COUNTER MEDICATION 1 tablet daily. Super beta prostate p3 advance care.   oxyCODONE 5 MG immediate release tablet Commonly known as:  Oxy IR/ROXICODONE Take 1-2 tablets (5-10 mg total) by mouth every 4 (four) hours as needed for moderate pain (pain score 4-6).   potassium chloride SA 20 MEQ tablet Commonly known as:  K-DUR,KLOR-CON Take 20 mEq by mouth 3 (three) times daily.   pravastatin 20 MG tablet Commonly known as:  PRAVACHOL Take 20 mg by mouth daily.  predniSONE 10 MG tablet Commonly known as:  DELTASONE Take 6 tablets on day 1, take 5 tablets on day 2, take 4 tablets on day 3, take 3 tablets on day 4, take 2 tablets on day 5, take 1 tablet on day 6   traMADol 50 MG tablet Commonly known as:  ULTRAM Take 1 tablet (50 mg total) by mouth every 6 (six) hours as needed for moderate pain or severe pain. What changed:  Another medication with the same name was added. Make sure you understand how and when to take each.   traMADol 50 MG tablet Commonly known as:  ULTRAM Take 1 tablet (50 mg total) by mouth every 4 (four) hours as needed for moderate pain. What changed:  You were already taking a medication with the same name, and this prescription was added. Make sure you understand how and when to take each.   TRIBENZOR 40-10-25 MG Tabs Generic drug:  Olmesartan-amLODIPine-HCTZ Take 1 tablet by mouth daily.       Diagnostic Studies: Ct Shoulder Right Wo Contrast  Result Date: 12/05/2017 CLINICAL DATA:  Chronic right shoulder pain. Preoperative examination. EXAM: CT OF THE UPPER RIGHT EXTREMITY WITHOUT CONTRAST TECHNIQUE: Multidetector CT imaging of the upper right extremity was performed according to the standard protocol. COMPARISON:  None. FINDINGS:  Bones/Joint/Cartilage The patient has acromioclavicular osteoarthritis with bulky osteophytosis off the inferior margin of the joint abutting the humeral head. The humeral head also abuts the acromion. The acromion is thinned, remodeled and sclerotic anteriorly. The patient has a mesoacromion type os acromiale. There is also glenohumeral degenerative change with an osteophyte off the humeral head. A single small subchondral cyst is noted in the glenoid. Degenerative change at the right sternoclavicular joint is also identified. Ligaments Suboptimally assessed by CT. Muscles and Tendons The supraspinatus and infraspinatus tendons are completely torn and retracted to the glenoid, 4.5-5.5 cm. Moderate to advanced atrophy of the muscle bellies is worse in the infraspinatus. The subscapularis appears intact. The long head of biceps is likely completely torn as it is not visualized in the bicipital groove or interval. Soft tissues Negative. IMPRESSION: Complete supraspinatus and infraspinatus tendon tears with 4.5-5.5 cm. Moderate to moderately severe atrophy is worse in the infraspinatus. Likely complete tear of the long head of biceps. 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 Findings consistent with chronic abutment of the humeral head and acromion secondary to the patient's rotator cuff tears. Mild to moderate glenohumeral osteoarthritis. Electronically Signed   By: Inge Rise M.D.   On: 12/05/2017 14:33   Dg Shoulder Right Port  Result Date: 12/12/2017 CLINICAL DATA:  Status post total shoulder replacement EXAM: PORTABLE RIGHT SHOULDER: 2 V COMPARISON:  CT right shoulder December 05, 2017 FINDINGS: Frontal and Y scapular views obtained. There is a total shoulder replacement with prosthetic components well-seated. No acute fracture or dislocation. There is bony overgrowth along the lateral clavicle and medial acromion with moderate impression on the underlying soft  tissue structures. No erosive change. Visualized right lung clear. IMPRESSION: Total shoulder replacement with prosthetic components well-seated. Osteoarthritic change in the right acromioclavicular joint with bony overgrowth inferiorly, moderate. Electronically Signed   By: Lowella Grip III M.D.   On: 12/12/2017 14:39    Disposition: Discharge disposition: 01-Home or Self Care       Discharge Instructions    Increase activity slowly   Complete by:  As directed          Signed: Feliberto Gottron  12/13/2017, 9:55 AM

## 2017-12-13 NOTE — Clinical Social Work Note (Signed)
CSW received referral for SNF.  Case discussed with case manager and plan is to discharge home with home health.  CSW to sign off please re-consult if social work needs arise.  Atha Muradyan R. Tylerjames Hoglund, MSW, LCSWA [phone]  

## 2017-12-13 NOTE — Progress Notes (Signed)
Discharge instructions reviewed with patient and wife. IV removed without complications. Prescriptions included in discharge package.

## 2017-12-14 LAB — SURGICAL PATHOLOGY

## 2017-12-16 ENCOUNTER — Other Ambulatory Visit: Payer: Self-pay

## 2017-12-16 ENCOUNTER — Emergency Department
Admission: EM | Admit: 2017-12-16 | Discharge: 2017-12-16 | Disposition: A | Discharge status: Home / Self Care | Payer: 59 | Attending: Emergency Medicine | Admitting: Emergency Medicine

## 2017-12-16 ENCOUNTER — Encounter: Payer: Self-pay | Admitting: Emergency Medicine

## 2017-12-16 DIAGNOSIS — I10 Essential (primary) hypertension: Secondary | ICD-10-CM | POA: Diagnosis not present

## 2017-12-16 DIAGNOSIS — Y939 Activity, unspecified: Secondary | ICD-10-CM | POA: Diagnosis not present

## 2017-12-16 DIAGNOSIS — Y999 Unspecified external cause status: Secondary | ICD-10-CM | POA: Insufficient documentation

## 2017-12-16 DIAGNOSIS — X58XXXA Exposure to other specified factors, initial encounter: Secondary | ICD-10-CM | POA: Diagnosis not present

## 2017-12-16 DIAGNOSIS — S199XXA Unspecified injury of neck, initial encounter: Secondary | ICD-10-CM | POA: Diagnosis present

## 2017-12-16 DIAGNOSIS — Z96611 Presence of right artificial shoulder joint: Secondary | ICD-10-CM | POA: Insufficient documentation

## 2017-12-16 DIAGNOSIS — S161XXA Strain of muscle, fascia and tendon at neck level, initial encounter: Secondary | ICD-10-CM | POA: Diagnosis not present

## 2017-12-16 DIAGNOSIS — Z7982 Long term (current) use of aspirin: Secondary | ICD-10-CM | POA: Insufficient documentation

## 2017-12-16 DIAGNOSIS — Y929 Unspecified place or not applicable: Secondary | ICD-10-CM | POA: Diagnosis not present

## 2017-12-16 DIAGNOSIS — Z79899 Other long term (current) drug therapy: Secondary | ICD-10-CM | POA: Diagnosis not present

## 2017-12-16 DIAGNOSIS — Z7984 Long term (current) use of oral hypoglycemic drugs: Secondary | ICD-10-CM | POA: Insufficient documentation

## 2017-12-16 DIAGNOSIS — Z96651 Presence of right artificial knee joint: Secondary | ICD-10-CM | POA: Insufficient documentation

## 2017-12-16 DIAGNOSIS — Z87891 Personal history of nicotine dependence: Secondary | ICD-10-CM | POA: Insufficient documentation

## 2017-12-16 DIAGNOSIS — E119 Type 2 diabetes mellitus without complications: Secondary | ICD-10-CM | POA: Insufficient documentation

## 2017-12-16 MED ORDER — LIDOCAINE 5 % EX PTCH: 1.0000 | MEDICATED_PATCH | Freq: Two times a day (BID) | CUTANEOUS | 0 refills | Status: DC

## 2017-12-16 MED ORDER — LIDOCAINE 5 % EX PTCH
1.0000 | MEDICATED_PATCH | CUTANEOUS | Status: DC
  Administered 2017-12-16: 1 via TRANSDERMAL
  Filled 2017-12-16: qty 1

## 2017-12-16 MED ORDER — CYCLOBENZAPRINE HCL 5 MG PO TABS: ORAL_TABLET | ORAL | 0 refills | Status: DC

## 2017-12-16 NOTE — ED Provider Notes (Signed)
[facility] Emergency Department Provider Note  ____________________________________________  Time seen: Approximately 9:27 AM  I have reviewed the triage vital signs and the nursing notes.   HISTORY  Chief Complaint Neck Pain    HPI Eric Browning is a 67 y.o. male that presents to the emergency department for evaluation of posterior neck pain for 7 days.  Pain is worse with range of motion.  Patient had rotator cuff surgery on his right shoulder on Tuesday and states that neck pain started about 3 days prior to surgery. Wife states that patient was complaining of neck pain over the weekend. Patient has been wearing a shoulder sling and pain is worse where the sling wraps around his neck. Pain improves when he props up his arm and takes the sling pressure off his neck. No significant shoulder pain. No fever, chills, headache, visual changes, anterior neck pain, SOB, CP, numbness, tingling.  Past Medical History:  Diagnosis Date  . Arthritis    hands  . Diabetes ([place])   . Gout   . Heart murmur    not treated for this  . High cholesterol   . Hypertension     Patient Active Problem List   Diagnosis Date Noted  . 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 Chest pain 12/09/2016  . 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 COLONOSCOPY    . COLONOSCOPY WITH PROPOFOL N/A 08/03/2015   Procedure: COLONOSCOPY WITH PROPOFOL;  Surgeon: Manya Silvas, MD;  Location: [facility] ENDOSCOPY;  Service: Endoscopy;  Laterality: N/A;  . EUS N/A 09/03/2015   Procedure: LOWER ENDOSCOPIC ULTRASOUND (EUS);  Surgeon: Holly Bodily, MD;  Location: [facility] ENDOSCOPY;  Service: Endoscopy;  Laterality: N/A;  . EYE SURGERY Right 2013   cataract extraction with iol implant  . JOINT REPLACEMENT Right 2017   dr. Roland Rack  . REVERSE SHOULDER ARTHROPLASTY Right 12/12/2017   Procedure:  REVERSE SHOULDER ARTHROPLASTY;  Surgeon: Corky Mull, MD;  Location: [facility] ORS;  Service: Orthopedics;  Laterality: Right;  . right eye lens implant Right   . TONSILLECTOMY    . TOTAL KNEE ARTHROPLASTY Right 05/12/2016   Procedure: TOTAL KNEE ARTHROPLASTY;  Surgeon: Corky Mull, MD;  Location: [facility] ORS;  Service: Orthopedics;  Laterality: Right;    Prior to Admission medications   Medication Sig Start Date End Date Taking? Authorizing Provider  allopurinol (ZYLOPRIM) 300 MG tablet Take 300 mg by mouth daily.    [provider]  aspirin EC 325 MG tablet Take 1 tablet (325 mg total) by mouth daily. 12/13/17   Duanne Guess, PA-C  colchicine 0.6 MG tablet Take 0.6 mg by mouth daily as needed. For gout flare-ups    [provider]  cyclobenzaprine (FLEXERIL) 5 MG tablet Take 1/2 to 1 tablet 2 times daily as needed. 12/16/17   Laban Emperor, PA-C  diazepam (VALIUM) 2 MG tablet Take 1 tablet (2 mg total) by mouth every 8 (eight) hours as needed for muscle spasms. Patient not taking: Reported on 12/04/2017 03/13/17   Paulette Blanch, MD  hydrALAZINE (APRESOLINE) 25 MG tablet Take 25 mg by mouth 3 (three) times daily.    [provider]  HYDROcodone-acetaminophen (NORCO) 5-325 MG tablet Take 1 tablet by mouth every 6 (six) hours as needed for moderate pain. Patient not taking: Reported on 12/04/2017 03/13/17   Paulette Blanch, MD  lidocaine [facility])  5 % Place 1 patch onto the skin every 12 (twelve) hours. Remove & Discard patch within 12 hours or as directed by MD 12/16/17 12/16/18  Laban Emperor, PA-C  metFORMIN (GLUCOPHAGE) 500 MG tablet Take 500 mg by mouth 2 (two) times daily.     [provider]  metoprolol tartrate (LOPRESSOR) 25 MG tablet Take 25 mg by mouth 2 (two) times daily.    [provider]  Olmesartan-Amlodipine-HCTZ (TRIBENZOR) 40-10-25 MG TABS Take 1 tablet by mouth daily.     [provider]  orphenadrine (NORFLEX) 100 MG tablet Take 1  tablet (100 mg total) by mouth 2 (two) times daily. Patient not taking: Reported on 12/04/2017 02/17/17   Sable Feil, PA-C  OVER THE COUNTER MEDICATION 1 tablet daily. Super beta prostate p3 advance care.    [provider]  oxyCODONE (OXY IR/ROXICODONE) 5 MG immediate release tablet Take 1-2 tablets (5-10 mg total) by mouth every 4 (four) hours as needed for moderate pain (pain score 4-6). 12/13/17   Watt Climes, PA  potassium chloride SA (K-DUR,KLOR-CON) 20 MEQ tablet Take 20 mEq by mouth 3 (three) times daily.     [provider]  pravastatin (PRAVACHOL) 20 MG tablet Take 20 mg by mouth daily.    [provider]  predniSONE (DELTASONE) 10 MG tablet Take 6 tablets on day 1, take 5 tablets on day 2, take 4 tablets on day 3, take 3 tablets on day 4, take 2 tablets on day 5, take 1 tablet on day 6 Patient not taking: Reported on 12/04/2017 03/03/17   Laban Emperor, PA-C  traMADol (ULTRAM) 50 MG tablet Take 1 tablet (50 mg total) by mouth every 6 (six) hours as needed for moderate pain or severe pain. Patient not taking: Reported on 12/04/2017 12/09/16   [name], MD  traMADol (ULTRAM) 50 MG tablet Take 1 tablet (50 mg total) by mouth every 4 (four) hours as needed for moderate pain. 12/13/17   Watt Climes, PA    Allergies Patient has no known allergies.  Family History  Problem Relation Age of Onset  . Heart disease Mother   . Stroke Father   . Hypertension Father   . Gout Father     Social History Social History   Tobacco Use  . Smoking status: Former Smoker    Packs/day: 1.00    Years: 30.00    Pack years: 30.00    Types: Cigarettes    Last attempt to quit: 09/28/1988    Years since quitting: 29.2  . Smokeless tobacco: Never Used  . Tobacco comment: quit 1990  Substance Use Topics  . Alcohol use: No  . Drug use: No     Review of Systems  Constitutional: No fever/chills Cardiovascular: No chest pain. Respiratory:  No  SOB. Gastrointestinal: No abdominal pain.  No nausea, no vomiting.  Musculoskeletal: Positive for neck and shoulder pain.  Skin: Negative for rash, abrasions, lacerations, ecchymosis. Neurological: Negative for headaches, numbness or tingling   ____________________________________________   PHYSICAL EXAM:  VITAL SIGNS: ED Triage Vitals  Enc Vitals Group     BP 12/16/17 0857 (!) 149/59     Pulse Rate 12/16/17 0857 81     Resp 12/16/17 0857 16     Temp 12/16/17 0857 98.6 F (37 C)     Temp Source 12/16/17 0857 Oral     SpO2 12/16/17 0857 96 %     Weight 12/16/17 0853 212 lb (96.2 kg)  Height 12/16/17 0853 5' 6.5" (1.689 m)     Head Circumference --      Peak Flow --      Pain Score 12/16/17 0853 8     Pain Loc --      Pain Edu? --      Excl. in [place]? --      Constitutional: Alert and oriented. Well appearing and in no acute distress. Eyes: Conjunctivae are normal. PERRL. EOMI. Head: Atraumatic. ENT:      Ears:      Nose: No congestion/rhinnorhea.      Mouth/Throat: Mucous membranes are moist.  Neck: No stridor. Mild cervical spine and paraspinal tenderness to palpation, worse on the right. Tenderness to palpation over right trapezius muscle. Mild pain with rotation, flexion, and extension of neck.  No bruis. Cardiovascular: Normal rate, regular rhythm.  Good peripheral circulation. Respiratory: Normal respiratory effort without tachypnea or retractions. Lungs CTAB. Good air entry to the bases with no decreased or absent breath sounds. Musculoskeletal: No gross deformities appreciated. Right shoulder sling in place. Good strength in left arm. Normal gait.  Neurologic:  Normal speech and language. No gross focal neurologic deficits are appreciated. Sensation equal in upper and lower extremities bilaterally. Skin:  Skin is warm, dry. Anterior surgical wound with no visible drainage or surrounding erythema.  Psychiatric: Mood and affect are normal. Speech and behavior are  normal. Patient exhibits appropriate insight and judgement.   ____________________________________________   LABS (all labs ordered are listed, but only abnormal results are displayed)  Labs Reviewed - No data to display ____________________________________________  EKG   ____________________________________________  RADIOLOGY  No results found.  ____________________________________________    PROCEDURES  Procedure(s) performed:    Procedures    Medications  lidocaine (LIDODERM) 5 % 1 patch (1 patch Transdermal Patch Applied 12/16/17 1009)     ____________________________________________   INITIAL IMPRESSION / ASSESSMENT AND PLAN / ED COURSE  Pertinent labs & imaging results that were available during my care of the patient were reviewed by me and considered in my medical decision making (see chart for details).  Review of the [place] CSRS was performed in accordance of the [place] prior to dispensing any controlled drugs.  Patient presented to the emergency department for evaluation of posterior neck pain for 2 days.  Vital signs and exam are reassuring.  Patient had rotator cuff surgery on Tuesday.  Neck pain began before surgery.  No signs of infection around the surgical wound. Pain improved in neck when sling was removed.  Pain is likely due to muscle strain.    Patient will be discharged home with prescriptions for a low dose of flexeril and lidoderm. Patient is to follow up with orthopedics and PCP as directed. Patient is given ED precautions to return to the ED for any worsening or new symptoms.     ____________________________________________  FINAL CLINICAL IMPRESSION(S) / ED DIAGNOSES  Final diagnoses:  Strain of neck muscle, initial encounter      NEW MEDICATIONS STARTED DURING THIS VISIT:  ED Discharge Orders        Ordered    cyclobenzaprine (FLEXERIL) 5 MG tablet     12/16/17 1017    lidocaine (LIDODERM) 5 %  Every 12 hours     12/16/17 1017           This chart was dictated using voice recognition software/Dragon. Despite best efforts to proofread, errors can occur which can change the meaning. Any change was purely unintentional.  Laban Emperor, PA-C 12/16/17 1535    Schuyler Amor, MD 12/17/17 0700

## 2017-12-16 NOTE — ED Triage Notes (Signed)
Neck pain x 6 days.  Patient states he feels like he has a "crook" in his neck.

## 2017-12-16 NOTE — ED Notes (Signed)
Pt states he had rotator cuff surgery on the right on Tuesday, pt states prior to surgery has had c/o neck pain states painful to turn from side to side. Pt states he has tried to take pain medication prescribed from surgery but states no improvement. Pt states the pain comes and goes.

## 2017-12-18 ENCOUNTER — Telehealth: Payer: Self-pay

## 2017-12-18 NOTE — Telephone Encounter (Signed)
EMMI Follow-up: Eric Browning was on the report (1A) and said he had received a call from my number.  I explained our new process of calling patients post discharge to make sure everything was going well.  He stated  he had a follow-up appointment on May 1st and no other concerns at this time. Asked him to respond to the prompts on 2nd automated call and let us know if there are any changes or concerns at that time.

## 2018-01-02 ENCOUNTER — Telehealth: Payer: Self-pay

## 2018-01-02 NOTE — Telephone Encounter (Signed)
EMMI Follow-up: [name] was calling back as she had just received a call from my number.  I explained our new call back system post discharge and he said everything was fine.  No needs at this time.
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
## 2018-05-14 ENCOUNTER — Encounter: Payer: Self-pay | Admitting: Podiatry

## 2018-05-14 ENCOUNTER — Ambulatory Visit (INDEPENDENT_AMBULATORY_CARE_PROVIDER_SITE_OTHER): Payer: 59

## 2018-05-14 ENCOUNTER — Other Ambulatory Visit: Payer: Self-pay | Admitting: Podiatry

## 2018-05-14 ENCOUNTER — Ambulatory Visit (INDEPENDENT_AMBULATORY_CARE_PROVIDER_SITE_OTHER): Payer: 59 | Admitting: Podiatry

## 2018-05-14 DIAGNOSIS — M722 Plantar fascial fibromatosis: Secondary | ICD-10-CM

## 2018-05-14 DIAGNOSIS — I10 Essential (primary) hypertension: Secondary | ICD-10-CM | POA: Insufficient documentation

## 2018-05-14 DIAGNOSIS — M779 Enthesopathy, unspecified: Secondary | ICD-10-CM

## 2018-05-14 DIAGNOSIS — E119 Type 2 diabetes mellitus without complications: Secondary | ICD-10-CM | POA: Insufficient documentation

## 2018-05-14 DIAGNOSIS — E7800 Pure hypercholesterolemia, unspecified: Secondary | ICD-10-CM | POA: Insufficient documentation

## 2018-05-14 DIAGNOSIS — M2142 Flat foot [pes planus] (acquired), left foot: Secondary | ICD-10-CM

## 2018-05-14 DIAGNOSIS — M2141 Flat foot [pes planus] (acquired), right foot: Secondary | ICD-10-CM | POA: Diagnosis not present

## 2018-05-14 DIAGNOSIS — E669 Obesity, unspecified: Secondary | ICD-10-CM | POA: Insufficient documentation

## 2018-05-14 DIAGNOSIS — E291 Testicular hypofunction: Secondary | ICD-10-CM | POA: Insufficient documentation

## 2018-05-14 NOTE — Progress Notes (Signed)
Subjective:  Patient ID: Eric Browning, male    DOB: [birth year],  MRN: [id number] HPI Chief Complaint  Patient presents with  . Foot Pain    Plantar feet bilateral - patient states he is having pins and needle like sensations x 2 weeks, only happens while at work, better after he gets home and off of them, no treatment  . Diabetes    Last a1c was 6.2  . New Patient (Initial Visit)    67 y.o. male presents with the above complaint.   ROS: Denies fever chills nausea vomiting muscle aches pains calf pain back pain chest pain shortness of breath.  Past Medical History:  Diagnosis Date  . Arthritis    hands  . Diabetes ([place])   . Gout   . Heart murmur    not treated for this  . High cholesterol   . Hypertension    Past Surgical History:  Procedure Laterality Date  . COLONOSCOPY    . COLONOSCOPY WITH PROPOFOL N/A 08/03/2015   Procedure: COLONOSCOPY WITH PROPOFOL;  Surgeon: Manya Silvas, MD;  Location: [facility] ENDOSCOPY;  Service: Endoscopy;  Laterality: N/A;  . EUS N/A 09/03/2015   Procedure: LOWER ENDOSCOPIC ULTRASOUND (EUS);  Surgeon: Holly Bodily, MD;  Location: [facility] ENDOSCOPY;  Service: Endoscopy;  Laterality: N/A;  . EYE SURGERY Right 2013   cataract extraction with iol implant  . JOINT REPLACEMENT Right 2017   dr. Roland Rack  . REVERSE SHOULDER ARTHROPLASTY Right 12/12/2017   Procedure: REVERSE SHOULDER ARTHROPLASTY;  Surgeon: Corky Mull, MD;  Location: [facility] ORS;  Service: Orthopedics;  Laterality: Right;  . right eye lens implant Right   . TONSILLECTOMY    . TOTAL KNEE ARTHROPLASTY Right 05/12/2016   Procedure: TOTAL KNEE ARTHROPLASTY;  Surgeon: Corky Mull, MD;  Location: [facility] ORS;  Service: Orthopedics;  Laterality: Right;    Current Outpatient Medications:  .  allopurinol (ZYLOPRIM) 300 MG tablet, Take 300 mg by mouth daily., Disp: , Rfl:  .  aspirin EC 325 MG tablet, Take 1 tablet (325 mg total) by mouth daily., Disp: 30 tablet, Rfl: 0 .  colchicine 0.6 MG  tablet, Take 0.6 mg by mouth daily as needed. For gout flare-ups, Disp: , Rfl:  .  etodolac (LODINE XL) 400 MG 24 hr tablet, , Disp: , Rfl:  .  metFORMIN (GLUCOPHAGE) 500 MG tablet, Take 500 mg by mouth 2 (two) times daily. , Disp: , Rfl:  .  metoprolol tartrate (LOPRESSOR) 25 MG tablet, Take 25 mg by mouth 2 (two) times daily., Disp: , Rfl:  .  Olmesartan-Amlodipine-HCTZ (TRIBENZOR) 40-10-25 MG TABS, Take 1 tablet by mouth daily. , Disp: , Rfl:  .  potassium chloride SA (K-DUR,KLOR-CON) 20 MEQ tablet, Take 20 mEq by mouth 3 (three) times daily. , Disp: , Rfl:  .  pravastatin (PRAVACHOL) 20 MG tablet, Take 20 mg by mouth daily., Disp: , Rfl:   No Known Allergies Review of Systems Objective:  There were no vitals filed for this visit.  General: Well developed, nourished, in no acute distress, alert and oriented x3   Dermatological: Skin is warm, dry and supple bilateral. Nails x 10 are well maintained; remaining integument appears unremarkable at this time. There are no open sores, no preulcerative lesions, no rash or signs of infection present.  Vascular: Dorsalis Pedis artery and Posterior Tibial artery pedal pulses are 2/4 bilateral with immedate capillary fill time. Pedal hair growth present. No varicosities and no lower extremity edema present bilateral.  Neruologic: Grossly intact via light touch bilateral. Vibratory intact via tuning fork bilateral. Protective threshold with Semmes Wienstein monofilament intact to all pedal sites bilateral. Patellar and Achilles deep tendon reflexes 2+ bilateral. No Babinski or clonus noted bilateral.   Musculoskeletal: No gross boney pedal deformities bilateral. No pain, crepitus, or limitation noted with foot and ankle range of motion bilateral. Muscular strength 5/5 in all groups tested bilateral.  Pes planus.  Gait: Unassisted, Nonantalgic.    Radiographs:  Radiographs taken today demonstrate pes planus with osteoarthritic changes to the  midfoot.  No acute findings.  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  More than likely because of his daytime pain is biomechanical.  Plan: We will start off with orthotics.  He will follow-up with Liliane Channel this week.     Max T. [place], [place]

## 2018-05-16 ENCOUNTER — Other Ambulatory Visit: Payer: 59 | Admitting: Orthotics

## 2018-05-30 ENCOUNTER — Ambulatory Visit: Payer: Medicare Other | Admitting: Orthotics

## 2018-05-30 DIAGNOSIS — M2142 Flat foot [pes planus] (acquired), left foot: Secondary | ICD-10-CM

## 2018-05-30 DIAGNOSIS — M2141 Flat foot [pes planus] (acquired), right foot: Secondary | ICD-10-CM

## 2018-05-30 NOTE — Progress Notes (Signed)
pateint wants insurance verified before ordering f/o to address pes planus condiion [place] [facility])

## 2018-06-07 ENCOUNTER — Telehealth: Payer: Self-pay | Admitting: Podiatry

## 2018-06-07 NOTE — Telephone Encounter (Signed)
Per voicemail from pt at 1251pm stating he got a call from [place] stating his insurance would cover the orthotics after deductible. Please call him back.

## 2018-07-11 ENCOUNTER — Ambulatory Visit: Payer: Medicare Other | Admitting: Orthotics

## 2018-07-11 DIAGNOSIS — M2142 Flat foot [pes planus] (acquired), left foot: Secondary | ICD-10-CM

## 2018-07-11 DIAGNOSIS — M2141 Flat foot [pes planus] (acquired), right foot: Secondary | ICD-10-CM

## 2018-07-11 NOTE — Progress Notes (Signed)
Patient came in today to p/up functional foot orthotics.   The orthotics were assessed to both fit and function.  The F/O addressed the biomechanical issues/pathologies as intended, offering good longitudinal arch support, proper offloading, and foot support. There weren't any signs of discomfort or irritation.  The F/O fit properly in footwear with minimal trimming/adjustments. 

## 2018-10-29 DIAGNOSIS — H6121 Impacted cerumen, right ear: Secondary | ICD-10-CM | POA: Diagnosis not present

## 2018-10-31 DIAGNOSIS — H6501 Acute serous otitis media, right ear: Secondary | ICD-10-CM | POA: Diagnosis not present

## 2018-10-31 DIAGNOSIS — H6123 Impacted cerumen, bilateral: Secondary | ICD-10-CM | POA: Diagnosis not present

## 2018-11-28 DIAGNOSIS — R400 Somnolence: Secondary | ICD-10-CM | POA: Diagnosis not present

## 2018-12-26 DIAGNOSIS — R0602 Shortness of breath: Secondary | ICD-10-CM | POA: Diagnosis not present

## 2018-12-26 DIAGNOSIS — I10 Essential (primary) hypertension: Secondary | ICD-10-CM | POA: Diagnosis not present

## 2018-12-26 DIAGNOSIS — E119 Type 2 diabetes mellitus without complications: Secondary | ICD-10-CM | POA: Diagnosis not present

## 2018-12-26 DIAGNOSIS — M109 Gout, unspecified: Secondary | ICD-10-CM | POA: Diagnosis not present

## 2018-12-26 DIAGNOSIS — I208 Other forms of angina pectoris: Secondary | ICD-10-CM | POA: Diagnosis not present

## 2018-12-26 DIAGNOSIS — E669 Obesity, unspecified: Secondary | ICD-10-CM | POA: Diagnosis not present

## 2018-12-26 DIAGNOSIS — D649 Anemia, unspecified: Secondary | ICD-10-CM | POA: Diagnosis not present

## 2018-12-26 DIAGNOSIS — R0600 Dyspnea, unspecified: Secondary | ICD-10-CM | POA: Diagnosis not present

## 2018-12-26 DIAGNOSIS — R002 Palpitations: Secondary | ICD-10-CM | POA: Diagnosis not present

## 2019-01-08 DIAGNOSIS — R0602 Shortness of breath: Secondary | ICD-10-CM | POA: Diagnosis not present

## 2019-01-08 DIAGNOSIS — I208 Other forms of angina pectoris: Secondary | ICD-10-CM | POA: Diagnosis not present

## 2019-01-10 DIAGNOSIS — H6501 Acute serous otitis media, right ear: Secondary | ICD-10-CM | POA: Diagnosis not present

## 2019-02-04 DIAGNOSIS — R400 Somnolence: Secondary | ICD-10-CM | POA: Diagnosis not present

## 2019-02-04 DIAGNOSIS — G4733 Obstructive sleep apnea (adult) (pediatric): Secondary | ICD-10-CM | POA: Diagnosis not present

## 2019-02-06 DIAGNOSIS — G4733 Obstructive sleep apnea (adult) (pediatric): Secondary | ICD-10-CM | POA: Diagnosis not present

## 2019-02-18 DIAGNOSIS — E1169 Type 2 diabetes mellitus with other specified complication: Secondary | ICD-10-CM | POA: Diagnosis not present

## 2019-02-18 DIAGNOSIS — E785 Hyperlipidemia, unspecified: Secondary | ICD-10-CM | POA: Diagnosis not present

## 2019-02-18 DIAGNOSIS — E7800 Pure hypercholesterolemia, unspecified: Secondary | ICD-10-CM | POA: Diagnosis not present

## 2019-02-18 DIAGNOSIS — D649 Anemia, unspecified: Secondary | ICD-10-CM | POA: Diagnosis not present

## 2019-02-18 DIAGNOSIS — I10 Essential (primary) hypertension: Secondary | ICD-10-CM | POA: Diagnosis not present

## 2019-02-25 DIAGNOSIS — E7800 Pure hypercholesterolemia, unspecified: Secondary | ICD-10-CM | POA: Diagnosis not present

## 2019-02-25 DIAGNOSIS — E785 Hyperlipidemia, unspecified: Secondary | ICD-10-CM | POA: Diagnosis not present

## 2019-02-25 DIAGNOSIS — I10 Essential (primary) hypertension: Secondary | ICD-10-CM | POA: Diagnosis not present

## 2019-02-25 DIAGNOSIS — G4733 Obstructive sleep apnea (adult) (pediatric): Secondary | ICD-10-CM | POA: Diagnosis not present

## 2019-02-25 DIAGNOSIS — E1169 Type 2 diabetes mellitus with other specified complication: Secondary | ICD-10-CM | POA: Diagnosis not present

## 2019-03-07 DIAGNOSIS — G4733 Obstructive sleep apnea (adult) (pediatric): Secondary | ICD-10-CM | POA: Diagnosis not present

## 2019-04-07 DIAGNOSIS — G4733 Obstructive sleep apnea (adult) (pediatric): Secondary | ICD-10-CM | POA: Diagnosis not present

## 2019-05-08 DIAGNOSIS — G4733 Obstructive sleep apnea (adult) (pediatric): Secondary | ICD-10-CM | POA: Diagnosis not present

## 2019-06-07 DIAGNOSIS — G4733 Obstructive sleep apnea (adult) (pediatric): Secondary | ICD-10-CM | POA: Diagnosis not present

## 2019-06-27 DIAGNOSIS — I10 Essential (primary) hypertension: Secondary | ICD-10-CM | POA: Diagnosis not present

## 2019-06-27 DIAGNOSIS — Z6835 Body mass index (BMI) 35.0-35.9, adult: Secondary | ICD-10-CM | POA: Diagnosis not present

## 2019-06-27 DIAGNOSIS — E1169 Type 2 diabetes mellitus with other specified complication: Secondary | ICD-10-CM | POA: Diagnosis not present

## 2019-06-27 DIAGNOSIS — Z87891 Personal history of nicotine dependence: Secondary | ICD-10-CM | POA: Diagnosis not present

## 2019-06-27 DIAGNOSIS — E7800 Pure hypercholesterolemia, unspecified: Secondary | ICD-10-CM | POA: Diagnosis not present

## 2019-06-27 DIAGNOSIS — R202 Paresthesia of skin: Secondary | ICD-10-CM | POA: Diagnosis not present

## 2019-07-03 DIAGNOSIS — I10 Essential (primary) hypertension: Secondary | ICD-10-CM | POA: Diagnosis not present

## 2019-07-03 DIAGNOSIS — E785 Hyperlipidemia, unspecified: Secondary | ICD-10-CM | POA: Diagnosis not present

## 2019-07-03 DIAGNOSIS — E7800 Pure hypercholesterolemia, unspecified: Secondary | ICD-10-CM | POA: Diagnosis not present

## 2019-07-03 DIAGNOSIS — R202 Paresthesia of skin: Secondary | ICD-10-CM | POA: Diagnosis not present

## 2019-07-03 DIAGNOSIS — E1169 Type 2 diabetes mellitus with other specified complication: Secondary | ICD-10-CM | POA: Diagnosis not present

## 2019-07-08 DIAGNOSIS — G4733 Obstructive sleep apnea (adult) (pediatric): Secondary | ICD-10-CM | POA: Diagnosis not present

## 2019-08-07 DIAGNOSIS — G4733 Obstructive sleep apnea (adult) (pediatric): Secondary | ICD-10-CM | POA: Diagnosis not present

## 2019-09-07 DIAGNOSIS — Z7982 Long term (current) use of aspirin: Secondary | ICD-10-CM | POA: Diagnosis not present

## 2019-09-07 DIAGNOSIS — E119 Type 2 diabetes mellitus without complications: Secondary | ICD-10-CM | POA: Diagnosis not present

## 2019-09-07 DIAGNOSIS — G4733 Obstructive sleep apnea (adult) (pediatric): Secondary | ICD-10-CM | POA: Diagnosis not present

## 2019-09-07 DIAGNOSIS — S161XXA Strain of muscle, fascia and tendon at neck level, initial encounter: Secondary | ICD-10-CM | POA: Diagnosis not present

## 2019-09-07 DIAGNOSIS — I10 Essential (primary) hypertension: Secondary | ICD-10-CM | POA: Diagnosis not present

## 2019-09-07 DIAGNOSIS — Z96611 Presence of right artificial shoulder joint: Secondary | ICD-10-CM | POA: Diagnosis not present

## 2019-09-07 DIAGNOSIS — E785 Hyperlipidemia, unspecified: Secondary | ICD-10-CM | POA: Diagnosis not present

## 2019-09-07 DIAGNOSIS — M542 Cervicalgia: Secondary | ICD-10-CM | POA: Diagnosis not present

## 2019-09-07 DIAGNOSIS — Z7984 Long term (current) use of oral hypoglycemic drugs: Secondary | ICD-10-CM | POA: Diagnosis not present

## 2019-09-07 DIAGNOSIS — Z87891 Personal history of nicotine dependence: Secondary | ICD-10-CM | POA: Diagnosis not present

## 2019-09-23 DIAGNOSIS — M542 Cervicalgia: Secondary | ICD-10-CM | POA: Diagnosis not present

## 2019-09-23 DIAGNOSIS — M47812 Spondylosis without myelopathy or radiculopathy, cervical region: Secondary | ICD-10-CM | POA: Diagnosis not present

## 2019-09-23 DIAGNOSIS — M5412 Radiculopathy, cervical region: Secondary | ICD-10-CM | POA: Diagnosis not present

## 2019-09-26 DIAGNOSIS — M47812 Spondylosis without myelopathy or radiculopathy, cervical region: Secondary | ICD-10-CM | POA: Diagnosis not present

## 2019-09-26 DIAGNOSIS — M5412 Radiculopathy, cervical region: Secondary | ICD-10-CM | POA: Diagnosis not present

## 2019-10-02 DIAGNOSIS — M5412 Radiculopathy, cervical region: Secondary | ICD-10-CM | POA: Diagnosis not present

## 2019-10-02 DIAGNOSIS — M47812 Spondylosis without myelopathy or radiculopathy, cervical region: Secondary | ICD-10-CM | POA: Diagnosis not present

## 2019-10-04 DIAGNOSIS — M47812 Spondylosis without myelopathy or radiculopathy, cervical region: Secondary | ICD-10-CM | POA: Diagnosis not present

## 2019-10-04 DIAGNOSIS — M5412 Radiculopathy, cervical region: Secondary | ICD-10-CM | POA: Diagnosis not present

## 2019-10-07 DIAGNOSIS — M25571 Pain in right ankle and joints of right foot: Secondary | ICD-10-CM | POA: Diagnosis not present

## 2019-10-07 DIAGNOSIS — M10071 Idiopathic gout, right ankle and foot: Secondary | ICD-10-CM | POA: Diagnosis not present

## 2019-10-08 DIAGNOSIS — G4733 Obstructive sleep apnea (adult) (pediatric): Secondary | ICD-10-CM | POA: Diagnosis not present

## 2019-10-09 DIAGNOSIS — M5412 Radiculopathy, cervical region: Secondary | ICD-10-CM | POA: Diagnosis not present

## 2019-10-09 DIAGNOSIS — M47812 Spondylosis without myelopathy or radiculopathy, cervical region: Secondary | ICD-10-CM | POA: Diagnosis not present

## 2019-10-11 DIAGNOSIS — M5412 Radiculopathy, cervical region: Secondary | ICD-10-CM | POA: Diagnosis not present

## 2019-10-11 DIAGNOSIS — M47812 Spondylosis without myelopathy or radiculopathy, cervical region: Secondary | ICD-10-CM | POA: Diagnosis not present

## 2019-10-18 DIAGNOSIS — M5412 Radiculopathy, cervical region: Secondary | ICD-10-CM | POA: Diagnosis not present

## 2019-10-18 DIAGNOSIS — M47812 Spondylosis without myelopathy or radiculopathy, cervical region: Secondary | ICD-10-CM | POA: Diagnosis not present

## 2019-10-25 DIAGNOSIS — I10 Essential (primary) hypertension: Secondary | ICD-10-CM | POA: Diagnosis not present

## 2019-10-25 DIAGNOSIS — D649 Anemia, unspecified: Secondary | ICD-10-CM | POA: Diagnosis not present

## 2019-10-25 DIAGNOSIS — E785 Hyperlipidemia, unspecified: Secondary | ICD-10-CM | POA: Diagnosis not present

## 2019-10-25 DIAGNOSIS — E1169 Type 2 diabetes mellitus with other specified complication: Secondary | ICD-10-CM | POA: Diagnosis not present

## 2019-10-25 DIAGNOSIS — E538 Deficiency of other specified B group vitamins: Secondary | ICD-10-CM | POA: Diagnosis not present

## 2019-10-25 DIAGNOSIS — E7800 Pure hypercholesterolemia, unspecified: Secondary | ICD-10-CM | POA: Diagnosis not present

## 2019-11-01 DIAGNOSIS — I10 Essential (primary) hypertension: Secondary | ICD-10-CM | POA: Diagnosis not present

## 2019-11-01 DIAGNOSIS — Z0000 Encounter for general adult medical examination without abnormal findings: Secondary | ICD-10-CM | POA: Diagnosis not present

## 2019-11-01 DIAGNOSIS — N400 Enlarged prostate without lower urinary tract symptoms: Secondary | ICD-10-CM | POA: Diagnosis not present

## 2019-11-05 DIAGNOSIS — G4733 Obstructive sleep apnea (adult) (pediatric): Secondary | ICD-10-CM | POA: Diagnosis not present

## 2019-12-06 DIAGNOSIS — G4733 Obstructive sleep apnea (adult) (pediatric): Secondary | ICD-10-CM | POA: Diagnosis not present

## 2020-01-05 DIAGNOSIS — G4733 Obstructive sleep apnea (adult) (pediatric): Secondary | ICD-10-CM | POA: Diagnosis not present

## 2020-02-05 DIAGNOSIS — G4733 Obstructive sleep apnea (adult) (pediatric): Secondary | ICD-10-CM | POA: Diagnosis not present

## 2020-02-06 DIAGNOSIS — E119 Type 2 diabetes mellitus without complications: Secondary | ICD-10-CM | POA: Diagnosis not present

## 2020-03-04 DIAGNOSIS — E785 Hyperlipidemia, unspecified: Secondary | ICD-10-CM | POA: Diagnosis not present

## 2020-03-04 DIAGNOSIS — E7800 Pure hypercholesterolemia, unspecified: Secondary | ICD-10-CM | POA: Diagnosis not present

## 2020-03-04 DIAGNOSIS — E1169 Type 2 diabetes mellitus with other specified complication: Secondary | ICD-10-CM | POA: Diagnosis not present

## 2020-03-04 DIAGNOSIS — I10 Essential (primary) hypertension: Secondary | ICD-10-CM | POA: Diagnosis not present

## 2020-03-06 DIAGNOSIS — G4733 Obstructive sleep apnea (adult) (pediatric): Secondary | ICD-10-CM | POA: Diagnosis not present

## 2020-03-11 DIAGNOSIS — R3914 Feeling of incomplete bladder emptying: Secondary | ICD-10-CM | POA: Diagnosis not present

## 2020-03-11 DIAGNOSIS — E7800 Pure hypercholesterolemia, unspecified: Secondary | ICD-10-CM | POA: Diagnosis not present

## 2020-03-11 DIAGNOSIS — I517 Cardiomegaly: Secondary | ICD-10-CM | POA: Diagnosis not present

## 2020-03-11 DIAGNOSIS — E1169 Type 2 diabetes mellitus with other specified complication: Secondary | ICD-10-CM | POA: Diagnosis not present

## 2020-03-11 DIAGNOSIS — R0989 Other specified symptoms and signs involving the circulatory and respiratory systems: Secondary | ICD-10-CM | POA: Diagnosis not present

## 2020-03-11 DIAGNOSIS — I5022 Chronic systolic (congestive) heart failure: Secondary | ICD-10-CM | POA: Diagnosis not present

## 2020-03-11 DIAGNOSIS — D649 Anemia, unspecified: Secondary | ICD-10-CM | POA: Diagnosis not present

## 2020-03-11 DIAGNOSIS — J811 Chronic pulmonary edema: Secondary | ICD-10-CM | POA: Diagnosis not present

## 2020-03-11 DIAGNOSIS — N401 Enlarged prostate with lower urinary tract symptoms: Secondary | ICD-10-CM | POA: Diagnosis not present

## 2020-03-11 DIAGNOSIS — I110 Hypertensive heart disease with heart failure: Secondary | ICD-10-CM | POA: Diagnosis not present

## 2020-04-30 DIAGNOSIS — E119 Type 2 diabetes mellitus without complications: Secondary | ICD-10-CM | POA: Diagnosis not present

## 2020-04-30 DIAGNOSIS — M722 Plantar fascial fibromatosis: Secondary | ICD-10-CM | POA: Diagnosis not present

## 2020-05-28 DIAGNOSIS — M722 Plantar fascial fibromatosis: Secondary | ICD-10-CM | POA: Diagnosis not present

## 2020-05-28 DIAGNOSIS — L600 Ingrowing nail: Secondary | ICD-10-CM | POA: Diagnosis not present

## 2020-05-28 DIAGNOSIS — B351 Tinea unguium: Secondary | ICD-10-CM | POA: Diagnosis not present

## 2020-05-28 DIAGNOSIS — E119 Type 2 diabetes mellitus without complications: Secondary | ICD-10-CM | POA: Diagnosis not present

## 2020-06-10 ENCOUNTER — Ambulatory Visit (LOCAL_COMMUNITY_HEALTH_CENTER): Payer: Self-pay

## 2020-06-10 ENCOUNTER — Other Ambulatory Visit: Payer: Self-pay

## 2020-06-10 DIAGNOSIS — Z23 Encounter for immunization: Secondary | ICD-10-CM

## 2020-06-25 DIAGNOSIS — J301 Allergic rhinitis due to pollen: Secondary | ICD-10-CM | POA: Diagnosis not present

## 2020-06-25 DIAGNOSIS — H6501 Acute serous otitis media, right ear: Secondary | ICD-10-CM | POA: Diagnosis not present

## 2020-06-25 DIAGNOSIS — J342 Deviated nasal septum: Secondary | ICD-10-CM | POA: Diagnosis not present

## 2020-07-09 DIAGNOSIS — E785 Hyperlipidemia, unspecified: Secondary | ICD-10-CM | POA: Diagnosis not present

## 2020-07-09 DIAGNOSIS — E1169 Type 2 diabetes mellitus with other specified complication: Secondary | ICD-10-CM | POA: Diagnosis not present

## 2020-07-09 DIAGNOSIS — D649 Anemia, unspecified: Secondary | ICD-10-CM | POA: Diagnosis not present

## 2020-07-09 DIAGNOSIS — E7800 Pure hypercholesterolemia, unspecified: Secondary | ICD-10-CM | POA: Diagnosis not present

## 2020-07-15 DIAGNOSIS — I10 Essential (primary) hypertension: Secondary | ICD-10-CM | POA: Diagnosis not present

## 2020-07-15 DIAGNOSIS — E876 Hypokalemia: Secondary | ICD-10-CM | POA: Diagnosis not present

## 2020-07-15 DIAGNOSIS — E1169 Type 2 diabetes mellitus with other specified complication: Secondary | ICD-10-CM | POA: Diagnosis not present

## 2020-07-15 DIAGNOSIS — E7800 Pure hypercholesterolemia, unspecified: Secondary | ICD-10-CM | POA: Diagnosis not present

## 2020-07-15 DIAGNOSIS — Z6835 Body mass index (BMI) 35.0-35.9, adult: Secondary | ICD-10-CM | POA: Diagnosis not present

## 2020-07-15 DIAGNOSIS — D649 Anemia, unspecified: Secondary | ICD-10-CM | POA: Diagnosis not present

## 2020-07-29 DIAGNOSIS — H90A31 Mixed conductive and sensorineural hearing loss, unilateral, right ear with restricted hearing on the contralateral side: Secondary | ICD-10-CM | POA: Diagnosis not present

## 2020-07-29 DIAGNOSIS — H6520 Chronic serous otitis media, unspecified ear: Secondary | ICD-10-CM | POA: Diagnosis not present

## 2020-08-26 DIAGNOSIS — H6521 Chronic serous otitis media, right ear: Secondary | ICD-10-CM | POA: Diagnosis not present

## 2020-08-26 DIAGNOSIS — H6123 Impacted cerumen, bilateral: Secondary | ICD-10-CM | POA: Diagnosis not present

## 2020-10-13 DIAGNOSIS — G4733 Obstructive sleep apnea (adult) (pediatric): Secondary | ICD-10-CM | POA: Diagnosis not present

## 2020-10-13 DIAGNOSIS — I10 Essential (primary) hypertension: Secondary | ICD-10-CM | POA: Diagnosis not present

## 2020-10-13 DIAGNOSIS — E119 Type 2 diabetes mellitus without complications: Secondary | ICD-10-CM | POA: Diagnosis not present

## 2020-10-13 DIAGNOSIS — E782 Mixed hyperlipidemia: Secondary | ICD-10-CM | POA: Diagnosis not present

## 2020-10-13 DIAGNOSIS — I519 Heart disease, unspecified: Secondary | ICD-10-CM | POA: Diagnosis not present

## 2020-10-13 DIAGNOSIS — I5020 Unspecified systolic (congestive) heart failure: Secondary | ICD-10-CM | POA: Diagnosis not present

## 2020-10-13 DIAGNOSIS — R0600 Dyspnea, unspecified: Secondary | ICD-10-CM | POA: Diagnosis not present

## 2020-10-13 DIAGNOSIS — E669 Obesity, unspecified: Secondary | ICD-10-CM | POA: Diagnosis not present

## 2020-10-13 DIAGNOSIS — R42 Dizziness and giddiness: Secondary | ICD-10-CM | POA: Diagnosis not present

## 2020-11-23 DIAGNOSIS — H6981 Other specified disorders of Eustachian tube, right ear: Secondary | ICD-10-CM | POA: Diagnosis not present

## 2020-11-23 DIAGNOSIS — J342 Deviated nasal septum: Secondary | ICD-10-CM | POA: Diagnosis not present

## 2020-11-23 DIAGNOSIS — E669 Obesity, unspecified: Secondary | ICD-10-CM | POA: Diagnosis not present

## 2020-11-23 DIAGNOSIS — Z86010 Personal history of colonic polyps: Secondary | ICD-10-CM | POA: Diagnosis not present

## 2020-11-23 DIAGNOSIS — H6521 Chronic serous otitis media, right ear: Secondary | ICD-10-CM | POA: Diagnosis not present

## 2020-11-23 DIAGNOSIS — G4733 Obstructive sleep apnea (adult) (pediatric): Secondary | ICD-10-CM | POA: Diagnosis not present

## 2020-11-26 DIAGNOSIS — I519 Heart disease, unspecified: Secondary | ICD-10-CM | POA: Diagnosis not present

## 2020-11-26 DIAGNOSIS — R0600 Dyspnea, unspecified: Secondary | ICD-10-CM | POA: Diagnosis not present

## 2020-12-01 ENCOUNTER — Other Ambulatory Visit
Admission: RE | Admit: 2020-12-01 | Discharge: 2020-12-01 | Disposition: A | Discharge status: Home / Self Care | Payer: Medicare HMO | Source: Ambulatory Visit | Attending: Internal Medicine | Admitting: Internal Medicine

## 2020-12-01 DIAGNOSIS — R42 Dizziness and giddiness: Secondary | ICD-10-CM | POA: Diagnosis not present

## 2020-12-01 DIAGNOSIS — I119 Hypertensive heart disease without heart failure: Secondary | ICD-10-CM | POA: Insufficient documentation

## 2020-12-01 DIAGNOSIS — I10 Essential (primary) hypertension: Secondary | ICD-10-CM | POA: Diagnosis not present

## 2020-12-01 DIAGNOSIS — E119 Type 2 diabetes mellitus without complications: Secondary | ICD-10-CM | POA: Diagnosis not present

## 2020-12-01 DIAGNOSIS — Z01818 Encounter for other preprocedural examination: Secondary | ICD-10-CM | POA: Insufficient documentation

## 2020-12-01 DIAGNOSIS — G4733 Obstructive sleep apnea (adult) (pediatric): Secondary | ICD-10-CM | POA: Diagnosis not present

## 2020-12-01 DIAGNOSIS — I208 Other forms of angina pectoris: Secondary | ICD-10-CM | POA: Diagnosis not present

## 2020-12-01 DIAGNOSIS — I519 Heart disease, unspecified: Secondary | ICD-10-CM | POA: Diagnosis not present

## 2020-12-01 DIAGNOSIS — E669 Obesity, unspecified: Secondary | ICD-10-CM | POA: Diagnosis not present

## 2020-12-01 DIAGNOSIS — E782 Mixed hyperlipidemia: Secondary | ICD-10-CM | POA: Diagnosis not present

## 2020-12-01 LAB — BRAIN NATRIURETIC PEPTIDE: B Natriuretic Peptide: 131.9 pg/mL — ABNORMAL HIGH (ref 0.0–100.0)

## 2020-12-14 ENCOUNTER — Other Ambulatory Visit: Payer: Self-pay

## 2020-12-14 ENCOUNTER — Other Ambulatory Visit
Admission: RE | Admit: 2020-12-14 | Discharge: 2020-12-14 | Disposition: A | Discharge status: Home / Self Care | Payer: Medicare HMO | Source: Ambulatory Visit | Attending: Internal Medicine | Admitting: Internal Medicine

## 2020-12-14 DIAGNOSIS — Z01812 Encounter for preprocedural laboratory examination: Secondary | ICD-10-CM | POA: Diagnosis not present

## 2020-12-14 DIAGNOSIS — Z20822 Contact with and (suspected) exposure to covid-19: Secondary | ICD-10-CM | POA: Diagnosis not present

## 2020-12-14 LAB — SARS CORONAVIRUS 2 (TAT 6-24 HRS): SARS Coronavirus 2: NEGATIVE

## 2020-12-16 DIAGNOSIS — I200 Unstable angina: Secondary | ICD-10-CM

## 2020-12-16 DIAGNOSIS — R9430 Abnormal result of cardiovascular function study, unspecified: Secondary | ICD-10-CM

## 2020-12-21 DIAGNOSIS — H6981 Other specified disorders of Eustachian tube, right ear: Secondary | ICD-10-CM | POA: Diagnosis not present

## 2020-12-21 DIAGNOSIS — J342 Deviated nasal septum: Secondary | ICD-10-CM | POA: Diagnosis not present

## 2020-12-28 ENCOUNTER — Other Ambulatory Visit
Admission: RE | Admit: 2020-12-28 | Discharge: 2020-12-28 | Disposition: A | Discharge status: Home / Self Care | Payer: Medicare HMO | Source: Ambulatory Visit | Attending: Internal Medicine | Admitting: Internal Medicine

## 2020-12-28 ENCOUNTER — Other Ambulatory Visit: Payer: Self-pay

## 2020-12-28 DIAGNOSIS — Z01812 Encounter for preprocedural laboratory examination: Secondary | ICD-10-CM | POA: Insufficient documentation

## 2020-12-28 DIAGNOSIS — Z20822 Contact with and (suspected) exposure to covid-19: Secondary | ICD-10-CM | POA: Insufficient documentation

## 2020-12-29 LAB — SARS CORONAVIRUS 2 (TAT 6-24 HRS): SARS Coronavirus 2: NEGATIVE

## 2020-12-31 ENCOUNTER — Observation Stay
Admission: RE | Admit: 2020-12-31 | Discharge: 2021-01-01 | Disposition: A | Discharge status: Home / Self Care | Payer: Medicare HMO | Attending: Internal Medicine | Admitting: Internal Medicine

## 2020-12-31 ENCOUNTER — Encounter: Payer: Self-pay | Admitting: Internal Medicine

## 2020-12-31 ENCOUNTER — Other Ambulatory Visit: Payer: Self-pay

## 2020-12-31 ENCOUNTER — Encounter
Admission: RE | Disposition: A | Discharge status: Home / Self Care | Payer: Self-pay | Source: Home / Self Care | Attending: Internal Medicine

## 2020-12-31 DIAGNOSIS — R9430 Abnormal result of cardiovascular function study, unspecified: Secondary | ICD-10-CM

## 2020-12-31 DIAGNOSIS — I200 Unstable angina: Secondary | ICD-10-CM

## 2020-12-31 DIAGNOSIS — Z7982 Long term (current) use of aspirin: Secondary | ICD-10-CM | POA: Diagnosis not present

## 2020-12-31 DIAGNOSIS — Z7984 Long term (current) use of oral hypoglycemic drugs: Secondary | ICD-10-CM | POA: Diagnosis not present

## 2020-12-31 DIAGNOSIS — Z96611 Presence of right artificial shoulder joint: Secondary | ICD-10-CM | POA: Diagnosis not present

## 2020-12-31 DIAGNOSIS — E119 Type 2 diabetes mellitus without complications: Secondary | ICD-10-CM | POA: Diagnosis not present

## 2020-12-31 DIAGNOSIS — I25110 Atherosclerotic heart disease of native coronary artery with unstable angina pectoris: Principal | ICD-10-CM | POA: Insufficient documentation

## 2020-12-31 DIAGNOSIS — Z87891 Personal history of nicotine dependence: Secondary | ICD-10-CM | POA: Diagnosis not present

## 2020-12-31 DIAGNOSIS — Z79899 Other long term (current) drug therapy: Secondary | ICD-10-CM | POA: Diagnosis not present

## 2020-12-31 DIAGNOSIS — I10 Essential (primary) hypertension: Secondary | ICD-10-CM | POA: Insufficient documentation

## 2020-12-31 DIAGNOSIS — I209 Angina pectoris, unspecified: Secondary | ICD-10-CM | POA: Diagnosis not present

## 2020-12-31 DIAGNOSIS — Z955 Presence of coronary angioplasty implant and graft: Secondary | ICD-10-CM

## 2020-12-31 HISTORY — PX: CORONARY STENT INTERVENTION: CATH118234

## 2020-12-31 HISTORY — PX: LEFT HEART CATH AND CORONARY ANGIOGRAPHY: CATH118249

## 2020-12-31 LAB — GLUCOSE, CAPILLARY: Glucose-Capillary: 106 mg/dL — ABNORMAL HIGH (ref 70–99)

## 2020-12-31 SURGERY — LEFT HEART CATH AND CORONARY ANGIOGRAPHY
Anesthesia: Moderate Sedation

## 2020-12-31 MED ORDER — ASPIRIN 81 MG PO CHEW
81.0000 mg | CHEWABLE_TABLET | Freq: Every day | ORAL | Status: DC
  Administered 2021-01-01: 81 mg via ORAL
  Filled 2020-12-31: qty 1

## 2020-12-31 MED ORDER — ALLOPURINOL 300 MG PO TABS
300.0000 mg | ORAL_TABLET | Freq: Every day | ORAL | Status: DC
  Administered 2021-01-01: 300 mg via ORAL
  Filled 2020-12-31: qty 1

## 2020-12-31 MED ORDER — SODIUM CHLORIDE 0.9 % WEIGHT BASED INFUSION: 1.0000 mL/kg/h | INTRAVENOUS | Status: DC

## 2020-12-31 MED ORDER — SODIUM CHLORIDE 0.9 % IV SOLN
INTRAVENOUS | Status: AC | PRN
  Administered 2020-12-31: 1.75 mg/kg/h via INTRAVENOUS
  Administered 2020-12-31: 0.25 mg/kg/h via INTRAVENOUS

## 2020-12-31 MED ORDER — HYDROCHLOROTHIAZIDE 25 MG PO TABS
25.0000 mg | ORAL_TABLET | Freq: Every day | ORAL | Status: DC
  Administered 2021-01-01: 25 mg via ORAL
  Filled 2020-12-31: qty 1

## 2020-12-31 MED ORDER — ASPIRIN 81 MG PO CHEW
81.0000 mg | CHEWABLE_TABLET | ORAL | Status: AC
  Administered 2020-12-31: 81 mg via ORAL

## 2020-12-31 MED ORDER — VERAPAMIL HCL 2.5 MG/ML IV SOLN
INTRAVENOUS | Status: DC | PRN
  Administered 2020-12-31: 2.5 mg via INTRA_ARTERIAL

## 2020-12-31 MED ORDER — HEPARIN (PORCINE) IN NACL 1000-0.9 UT/500ML-% IV SOLN
INTRAVENOUS | Status: DC | PRN
  Administered 2020-12-31 (×2): 500 mL

## 2020-12-31 MED ORDER — HEPARIN (PORCINE) IN NACL 1000-0.9 UT/500ML-% IV SOLN
INTRAVENOUS | Status: AC
  Filled 2020-12-31: qty 1000

## 2020-12-31 MED ORDER — MIDAZOLAM HCL 2 MG/2ML IJ SOLN
INTRAMUSCULAR | Status: AC
  Filled 2020-12-31: qty 2

## 2020-12-31 MED ORDER — LIDOCAINE HCL (PF) 1 % IJ SOLN
INTRAMUSCULAR | Status: AC
  Filled 2020-12-31: qty 30

## 2020-12-31 MED ORDER — ACETAMINOPHEN 325 MG PO TABS
650.0000 mg | ORAL_TABLET | ORAL | Status: DC | PRN
  Administered 2021-01-01: 650 mg via ORAL
  Filled 2020-12-31: qty 2

## 2020-12-31 MED ORDER — VERAPAMIL HCL 2.5 MG/ML IV SOLN
INTRAVENOUS | Status: AC
  Filled 2020-12-31: qty 2

## 2020-12-31 MED ORDER — SODIUM CHLORIDE 0.9 % IV SOLN
1.7500 mg/kg/h | INTRAVENOUS | Status: AC
  Filled 2020-12-31: qty 250

## 2020-12-31 MED ORDER — SODIUM CHLORIDE 0.9 % IV SOLN: 250.0000 mL | INTRAVENOUS | Status: DC | PRN

## 2020-12-31 MED ORDER — ASPIRIN 81 MG PO CHEW
CHEWABLE_TABLET | ORAL | Status: DC | PRN
Start: 2020-12-31 — End: 2020-12-31
  Administered 2020-12-31: 243 mg via ORAL

## 2020-12-31 MED ORDER — ONDANSETRON HCL 4 MG/2ML IJ SOLN: 4.0000 mg | Freq: Four times a day (QID) | INTRAMUSCULAR | Status: DC | PRN

## 2020-12-31 MED ORDER — SODIUM CHLORIDE 0.9% FLUSH
3.0000 mL | Freq: Two times a day (BID) | INTRAVENOUS | Status: DC
  Administered 2020-12-31 – 2021-01-01 (×2): 3 mL via INTRAVENOUS

## 2020-12-31 MED ORDER — IOHEXOL 300 MG/ML  SOLN
INTRAMUSCULAR | Status: DC | PRN
  Administered 2020-12-31: 177 mL

## 2020-12-31 MED ORDER — TICAGRELOR 90 MG PO TABS
ORAL_TABLET | ORAL | Status: DC | PRN
  Administered 2020-12-31: 180 mg via ORAL

## 2020-12-31 MED ORDER — SODIUM CHLORIDE 0.9% FLUSH: 3.0000 mL | INTRAVENOUS | Status: DC | PRN

## 2020-12-31 MED ORDER — BIVALIRUDIN TRIFLUOROACETATE 250 MG IV SOLR
INTRAVENOUS | Status: AC
  Filled 2020-12-31: qty 250

## 2020-12-31 MED ORDER — AMLODIPINE BESYLATE 10 MG PO TABS
10.0000 mg | ORAL_TABLET | Freq: Every day | ORAL | Status: DC
  Administered 2021-01-01: 10 mg via ORAL
  Filled 2020-12-31: qty 1

## 2020-12-31 MED ORDER — HYDRALAZINE HCL 20 MG/ML IJ SOLN: 10.0000 mg | INTRAMUSCULAR | Status: AC | PRN

## 2020-12-31 MED ORDER — LABETALOL HCL 5 MG/ML IV SOLN: 10.0000 mg | INTRAVENOUS | Status: AC | PRN

## 2020-12-31 MED ORDER — TICAGRELOR 90 MG PO TABS
90.0000 mg | ORAL_TABLET | Freq: Two times a day (BID) | ORAL | Status: DC
  Administered 2020-12-31 – 2021-01-01 (×2): 90 mg via ORAL
  Filled 2020-12-31 (×2): qty 1

## 2020-12-31 MED ORDER — MIDAZOLAM HCL 2 MG/2ML IJ SOLN
INTRAMUSCULAR | Status: DC | PRN
  Administered 2020-12-31 (×2): 1 mg via INTRAVENOUS

## 2020-12-31 MED ORDER — HYDRALAZINE HCL 25 MG PO TABS
25.0000 mg | ORAL_TABLET | Freq: Three times a day (TID) | ORAL | Status: DC
  Administered 2020-12-31 – 2021-01-01 (×2): 25 mg via ORAL
  Filled 2020-12-31 (×2): qty 1

## 2020-12-31 MED ORDER — ASPIRIN 81 MG PO CHEW
CHEWABLE_TABLET | ORAL | Status: AC
  Filled 2020-12-31: qty 1

## 2020-12-31 MED ORDER — SODIUM CHLORIDE 0.9 % WEIGHT BASED INFUSION: 1.0000 mL/kg/h | INTRAVENOUS | Status: AC

## 2020-12-31 MED ORDER — METOPROLOL TARTRATE 25 MG PO TABS
25.0000 mg | ORAL_TABLET | Freq: Two times a day (BID) | ORAL | Status: DC
  Administered 2020-12-31 – 2021-01-01 (×2): 25 mg via ORAL
  Filled 2020-12-31 (×2): qty 1

## 2020-12-31 MED ORDER — SODIUM CHLORIDE 0.9 % WEIGHT BASED INFUSION: 3.0000 mL/kg/h | INTRAVENOUS | Status: AC

## 2020-12-31 MED ORDER — TICAGRELOR 90 MG PO TABS
ORAL_TABLET | ORAL | Status: AC
  Filled 2020-12-31: qty 1

## 2020-12-31 MED ORDER — POTASSIUM CHLORIDE CRYS ER 20 MEQ PO TBCR
20.0000 meq | EXTENDED_RELEASE_TABLET | Freq: Two times a day (BID) | ORAL | Status: DC
  Administered 2020-12-31 – 2021-01-01 (×2): 20 meq via ORAL
  Filled 2020-12-31 (×2): qty 1

## 2020-12-31 MED ORDER — FENTANYL CITRATE (PF) 100 MCG/2ML IJ SOLN
INTRAMUSCULAR | Status: AC
  Filled 2020-12-31: qty 2

## 2020-12-31 MED ORDER — BIVALIRUDIN BOLUS VIA INFUSION - CUPID
INTRAVENOUS | Status: DC | PRN
  Administered 2020-12-31: 71.475 mg via INTRAVENOUS

## 2020-12-31 MED ORDER — FENTANYL CITRATE (PF) 100 MCG/2ML IJ SOLN
INTRAMUSCULAR | Status: DC | PRN
  Administered 2020-12-31 (×2): 25 ug via INTRAVENOUS

## 2020-12-31 MED ORDER — HEPARIN SODIUM (PORCINE) 1000 UNIT/ML IJ SOLN
INTRAMUSCULAR | Status: AC
  Filled 2020-12-31: qty 1

## 2020-12-31 MED ORDER — IRBESARTAN 150 MG PO TABS
300.0000 mg | ORAL_TABLET | Freq: Every day | ORAL | Status: DC
  Administered 2021-01-01: 300 mg via ORAL
  Filled 2020-12-31: qty 2

## 2020-12-31 MED ORDER — LIDOCAINE HCL (PF) 1 % IJ SOLN
INTRAMUSCULAR | Status: DC | PRN
Start: 2020-12-31 — End: 2020-12-31
  Administered 2020-12-31: 20 mL
  Administered 2020-12-31: 2 mL

## 2020-12-31 MED ORDER — PRAVASTATIN SODIUM 20 MG PO TABS
20.0000 mg | ORAL_TABLET | Freq: Every day | ORAL | Status: DC
  Administered 2020-12-31: 20 mg via ORAL
  Filled 2020-12-31: qty 1

## 2020-12-31 MED ORDER — ASPIRIN 81 MG PO CHEW
CHEWABLE_TABLET | ORAL | Status: AC
  Filled 2020-12-31: qty 3

## 2020-12-31 SURGICAL SUPPLY — 28 items
BALLN TREK RX 2.5X15 (BALLOONS) ×2
BALLN ~~LOC~~ TREK RX 3.5X8 (BALLOONS) ×2
BALLOON TREK RX 2.5X15 (BALLOONS) ×1 IMPLANT
BALLOON ~~LOC~~ TREK RX 3.5X8 (BALLOONS) ×1 IMPLANT
CATH INFINITI 5 FR JL3.5 (CATHETERS) ×2 IMPLANT
CATH INFINITI 5FR JL4 (CATHETERS) ×2 IMPLANT
CATH INFINITI JR4 5F (CATHETERS) ×2 IMPLANT
CATH LAUNCHER 6FR JR4 (CATHETERS) IMPLANT
CATH VISTA GUIDE 6FR JR4 (CATHETERS) ×2 IMPLANT
DEVICE CLOSURE MYNXGRIP 6/7F (Vascular Products) ×2 IMPLANT
DEVICE RAD TR BAND REGULAR (VASCULAR PRODUCTS) ×2 IMPLANT
DRAPE BRACHIAL (DRAPES) ×2 IMPLANT
GLIDESHEATH SLEND SS 6F .021 (SHEATH) ×2 IMPLANT
GUIDEWIRE INQWIRE 1.5J.035X260 (WIRE) ×1 IMPLANT
INQWIRE 1.5J .035X260CM (WIRE) ×2
KIT ENCORE 26 ADVANTAGE (KITS) ×2 IMPLANT
NEEDLE PERC 18GX7CM (NEEDLE) ×2 IMPLANT
PACK CARDIAC CATH (CUSTOM PROCEDURE TRAY) ×2 IMPLANT
PROTECTION STATION PRESSURIZED (MISCELLANEOUS) ×2
SET ATX SIMPLICITY (MISCELLANEOUS) ×2 IMPLANT
SHEATH AVANTI 5FR X 11CM (SHEATH) ×2 IMPLANT
SHEATH AVANTI 6FR X 11CM (SHEATH) ×2 IMPLANT
STATION PROTECTION PRESSURIZED (MISCELLANEOUS) ×1 IMPLANT
STENT RESOLUTE ONYX 3.0X15 (Permanent Stent) ×2 IMPLANT
TUBING CIL FLEX 10 FLL-RA (TUBING) ×2 IMPLANT
WIRE G HI TQ BMW 190 (WIRE) ×2 IMPLANT
WIRE GUIDERIGHT .035X150 (WIRE) ×2 IMPLANT
WIRE HITORQ VERSACORE ST 145CM (WIRE) ×2 IMPLANT

## 2020-12-31 NOTE — Discharge Instructions (Signed)
Moderate Conscious Sedation, Adult, Care After This sheet gives you information about how to care for yourself after your procedure. Your health care provider may also give you more specific instructions. If you have problems or questions, contact your health care provider. What can I expect after the procedure? After the procedure, it is common to have:  Sleepiness for several hours.  Impaired judgment for several hours.  Difficulty with balance.  Vomiting if you eat too soon. Follow these instructions at home: For the time period you were told by your health care provider:  Rest.  Do not participate in activities where you could fall or become injured.  Do not drive or use machinery.  Do not drink alcohol.  Do not take sleeping pills or medicines that cause drowsiness.  Do not make important decisions or sign legal documents.  Do not take care of children on your own.      Eating and drinking  Follow the diet recommended by your health care provider.  Drink enough fluid to keep your urine pale yellow.  If you vomit: ? Drink water, juice, or soup when you can drink without vomiting. ? Make sure you have little or no nausea before eating solid foods.   General instructions  Take over-the-counter and prescription medicines only as told by your health care provider.  Have a responsible adult stay with you for the time you are told. It is important to have someone help care for you until you are awake and alert.  Do not smoke.  Keep all follow-up visits as told by your health care provider. This is important. Contact a health care provider if:  You are still sleepy or having trouble with balance after 24 hours.  You feel light-headed.  You keep feeling nauseous or you keep vomiting.  You develop a rash.  You have a fever.  You have redness or swelling around the IV site. Get help right away if:  You have trouble breathing.  You have new-onset confusion at  home. Summary  After the procedure, it is common to feel sleepy, have impaired judgment, or feel nauseous if you eat too soon.  Rest after you get home. Know the things you should not do after the procedure.  Follow the diet recommended by your health care provider and drink enough fluid to keep your urine pale yellow.  Get help right away if you have trouble breathing or new-onset confusion at home. This information is not intended to replace advice given to you by your health care provider. Make sure you discuss any questions you have with your health care provider. Document Revised: 12/13/2019 Document Reviewed: 07/11/2019 Elsevier Patient Education  2021 [employer]  

## 2021-01-01 ENCOUNTER — Encounter: Payer: Self-pay | Admitting: Internal Medicine

## 2021-01-01 DIAGNOSIS — Z7982 Long term (current) use of aspirin: Secondary | ICD-10-CM | POA: Diagnosis not present

## 2021-01-01 DIAGNOSIS — E119 Type 2 diabetes mellitus without complications: Secondary | ICD-10-CM | POA: Diagnosis not present

## 2021-01-01 DIAGNOSIS — Z96611 Presence of right artificial shoulder joint: Secondary | ICD-10-CM | POA: Diagnosis not present

## 2021-01-01 DIAGNOSIS — I10 Essential (primary) hypertension: Secondary | ICD-10-CM | POA: Diagnosis not present

## 2021-01-01 DIAGNOSIS — I25110 Atherosclerotic heart disease of native coronary artery with unstable angina pectoris: Secondary | ICD-10-CM | POA: Diagnosis not present

## 2021-01-01 DIAGNOSIS — Z79899 Other long term (current) drug therapy: Secondary | ICD-10-CM | POA: Diagnosis not present

## 2021-01-01 DIAGNOSIS — I200 Unstable angina: Secondary | ICD-10-CM | POA: Diagnosis not present

## 2021-01-01 DIAGNOSIS — Z87891 Personal history of nicotine dependence: Secondary | ICD-10-CM | POA: Diagnosis not present

## 2021-01-01 DIAGNOSIS — Z7984 Long term (current) use of oral hypoglycemic drugs: Secondary | ICD-10-CM | POA: Diagnosis not present

## 2021-01-01 LAB — BASIC METABOLIC PANEL
Anion gap: 11 (ref 5–15)
BUN: 26 mg/dL — ABNORMAL HIGH (ref 8–23)
CO2: 27 mmol/L (ref 22–32)
Calcium: 10.1 mg/dL (ref 8.9–10.3)
Chloride: 101 mmol/L (ref 98–111)
Creatinine, Ser: 1.4 mg/dL — ABNORMAL HIGH (ref 0.61–1.24)
GFR, Estimated: 53 mL/min — ABNORMAL LOW (ref >60)
Glucose, Bld: 145 mg/dL — ABNORMAL HIGH (ref 70–99)
Potassium: 3.1 mmol/L — ABNORMAL LOW (ref 3.5–5.1)
Sodium: 139 mmol/L (ref 135–145)

## 2021-01-01 LAB — CBC
HCT: 35 % — ABNORMAL LOW (ref 39.0–52.0)
Hemoglobin: 11.5 g/dL — ABNORMAL LOW (ref 13.0–17.0)
MCH: 26.5 pg (ref 26.0–34.0)
MCHC: 32.9 g/dL (ref 30.0–36.0)
MCV: 80.6 fL (ref 80.0–100.0)
Platelets: 386 10*3/uL (ref 150–400)
RBC: 4.34 MIL/uL (ref 4.22–5.81)
RDW: 15.9 % — ABNORMAL HIGH (ref 11.5–15.5)
WBC: 9.9 10*3/uL (ref 4.0–10.5)
nRBC: 0 % (ref 0.0–0.2)

## 2021-01-01 LAB — POCT ACTIVATED CLOTTING TIME: Activated Clotting Time: 398 seconds

## 2021-01-01 MED ORDER — TICAGRELOR 90 MG PO TABS: 90.0000 mg | ORAL_TABLET | Freq: Two times a day (BID) | ORAL | 6 refills | Status: DC

## 2021-01-01 MED ORDER — ROSUVASTATIN CALCIUM 10 MG PO TABS: 20.0000 mg | ORAL_TABLET | Freq: Every day | ORAL | Status: DC

## 2021-01-01 MED ORDER — AMLODIPINE BESYLATE 10 MG PO TABS: 10.0000 mg | ORAL_TABLET | Freq: Every day | ORAL | 6 refills | Status: DC

## 2021-01-01 MED ORDER — HYDROCHLOROTHIAZIDE 25 MG PO TABS: 25.0000 mg | ORAL_TABLET | Freq: Every day | ORAL | 6 refills | Status: DC

## 2021-01-01 MED ORDER — HYDRALAZINE HCL 25 MG PO TABS: 25.0000 mg | ORAL_TABLET | Freq: Three times a day (TID) | ORAL | 6 refills | Status: DC

## 2021-01-01 MED ORDER — ROSUVASTATIN CALCIUM 20 MG PO TABS: 20.0000 mg | ORAL_TABLET | Freq: Every day | ORAL | 6 refills | Status: DC

## 2021-01-01 MED ORDER — PRAVASTATIN SODIUM 20 MG PO TABS: 20.0000 mg | ORAL_TABLET | Freq: Every day | ORAL | 6 refills | Status: DC

## 2021-01-01 NOTE — Discharge Summary (Signed)
Physician Discharge Summary      Patient ID: Eric Browning MRN: [id number] DOB/AGE: April 10, 1951 70 y.o.  Admit date: 12/31/2020 Discharge date: 01/01/2021  Primary Discharge Diagnosis unstable angina coronary artery disease Secondary Discharge Diagnosis status post PCI and stent DES  Significant Diagnostic Studies: Cardiac cath PCI and stent   Consults: None  Hospital Course: Patient was admitted initially for an outpatient cardiac cath was found to have a high-grade lesion in the distal RCA he underwent PCI and stent with DES successfully and stayed in observation for 23 hours prior to discharge with no complications ambulating in the hall well and is now on aspirin and Brilinta for 12 months.  Patient is to continue his other medications as prior to admission   Discharge Exam: Blood pressure (!) 156/78, pulse 84, temperature 98.2 F (36.8 C), temperature source Oral, resp. rate 16, height 5\' 7"  (1.702 m), weight 94.9 kg, SpO2 99 %.   General appearance: appears stated age Resp: clear to auscultation bilaterally Cardio: regular rate and rhythm, S1, S2 normal, no murmur, click, rub or gallop GI: soft, non-tender; bowel sounds normal; no masses,  no organomegaly Extremities: extremities normal, atraumatic, no cyanosis or edema Pulses: 2+ and symmetric Neurologic: Alert and oriented X 3, normal strength and tone. Normal symmetric reflexes. Normal coordination and gait Labs:   Lab Results  Component Value Date   WBC 9.9 01/01/2021   HGB 11.5 (L) 01/01/2021   HCT 35.0 (L) 01/01/2021   MCV 80.6 01/01/2021   PLT 386 01/01/2021    Recent Labs  Lab 01/01/21 0420  NA 139  K 3.1*  CL 101  CO2 27  BUN 26*  CREATININE 1.40*  CALCIUM 10.1  GLUCOSE 145*      Radiology: Cardiac cath PCI and stent EKG: Normal sinus rhythm nonspecific ST-T changes rate of 80  FOLLOW UP PLANS AND APPOINTMENTS Discharge Instructions    AMB Referral to Cardiac Rehabilitation - Phase II   Complete  by: As directed    Diagnosis: Coronary Stents   After initial evaluation and assessments completed: Virtual Based Care may be provided alone or in conjunction with Phase 2 Cardiac Rehab based on patient barriers.: Yes     Allergies as of 01/01/2021   No Known Allergies     Medication List    TAKE these medications   allopurinol 300 MG tablet Commonly known as: ZYLOPRIM Take 300 mg by mouth daily.   amLODipine 10 MG tablet Commonly known as: NORVASC Take 1 tablet (10 mg total) by mouth daily. Start taking on: Jan 02, 2021   aspirin EC 81 MG tablet Take 81 mg by mouth daily. Swallow whole.   colchicine 0.6 MG tablet Take 0.6 mg by mouth daily as needed. For gout flare-ups   etodolac 400 MG 24 hr tablet Commonly known as: LODINE XL Take 400 mg by mouth daily.   hydrALAZINE 25 MG tablet Commonly known as: APRESOLINE Take 1 tablet (25 mg total) by mouth 3 (three) times daily.   hydrochlorothiazide 25 MG tablet Commonly known as: HYDRODIURIL Take 1 tablet (25 mg total) by mouth daily. Start taking on: Jan 02, 2021   metFORMIN 500 MG tablet Commonly known as: GLUCOPHAGE Take 500 mg by mouth 2 (two) times daily.   metoprolol tartrate 25 MG tablet Commonly known as: LOPRESSOR Take 25 mg by mouth 2 (two) times daily.   Olmesartan-amLODIPine-HCTZ 40-10-25 MG Tabs Take 1 tablet by mouth daily.   potassium chloride SA 20 MEQ tablet Commonly  known as: KLOR-CON Take 20 mEq by mouth 3 (three) times daily.   pravastatin 20 MG tablet Commonly known as: PRAVACHOL Take 1 tablet (20 mg total) by mouth daily at 6 PM. What changed: when to take this   ticagrelor 90 MG Tabs tablet Commonly known as: BRILINTA Take 1 tablet (90 mg total) by mouth 2 (two) times daily.       Follow-up Information    Cas Tracz D, MD Follow up in 2 week(s).   Specialties: Cardiology, Internal Medicine Why: Call office and make an appointment Contact information: [place] [id number] [address] WITH YOU TO FOLLOW UP APPOINTMENTS  Time spent with patient to include physician time: 30 Signed:  Yolonda Kida MD 01/01/2021, 10:27 AM

## 2021-01-01 NOTE — Plan of Care (Signed)

## 2021-01-01 NOTE — Care Management (Signed)
Brilinta coupon left at bedside with patient for discharge

## 2021-01-05 DIAGNOSIS — E1169 Type 2 diabetes mellitus with other specified complication: Secondary | ICD-10-CM | POA: Diagnosis not present

## 2021-01-05 DIAGNOSIS — E7800 Pure hypercholesterolemia, unspecified: Secondary | ICD-10-CM | POA: Diagnosis not present

## 2021-01-05 DIAGNOSIS — E785 Hyperlipidemia, unspecified: Secondary | ICD-10-CM | POA: Diagnosis not present

## 2021-01-05 DIAGNOSIS — D649 Anemia, unspecified: Secondary | ICD-10-CM | POA: Diagnosis not present

## 2021-01-05 DIAGNOSIS — I10 Essential (primary) hypertension: Secondary | ICD-10-CM | POA: Diagnosis not present

## 2021-01-12 DIAGNOSIS — I2510 Atherosclerotic heart disease of native coronary artery without angina pectoris: Secondary | ICD-10-CM | POA: Diagnosis not present

## 2021-01-12 DIAGNOSIS — E785 Hyperlipidemia, unspecified: Secondary | ICD-10-CM | POA: Diagnosis not present

## 2021-01-12 DIAGNOSIS — E1122 Type 2 diabetes mellitus with diabetic chronic kidney disease: Secondary | ICD-10-CM | POA: Diagnosis not present

## 2021-01-12 DIAGNOSIS — Z0000 Encounter for general adult medical examination without abnormal findings: Secondary | ICD-10-CM | POA: Diagnosis not present

## 2021-01-12 DIAGNOSIS — Z1159 Encounter for screening for other viral diseases: Secondary | ICD-10-CM | POA: Diagnosis not present

## 2021-01-12 DIAGNOSIS — E669 Obesity, unspecified: Secondary | ICD-10-CM | POA: Diagnosis not present

## 2021-01-12 DIAGNOSIS — Z9861 Coronary angioplasty status: Secondary | ICD-10-CM | POA: Diagnosis not present

## 2021-01-12 DIAGNOSIS — D649 Anemia, unspecified: Secondary | ICD-10-CM | POA: Diagnosis not present

## 2021-01-19 DIAGNOSIS — E782 Mixed hyperlipidemia: Secondary | ICD-10-CM | POA: Diagnosis not present

## 2021-01-19 DIAGNOSIS — G4733 Obstructive sleep apnea (adult) (pediatric): Secondary | ICD-10-CM | POA: Diagnosis not present

## 2021-01-19 DIAGNOSIS — I2510 Atherosclerotic heart disease of native coronary artery without angina pectoris: Secondary | ICD-10-CM | POA: Diagnosis not present

## 2021-01-19 DIAGNOSIS — R42 Dizziness and giddiness: Secondary | ICD-10-CM | POA: Diagnosis not present

## 2021-01-19 DIAGNOSIS — E119 Type 2 diabetes mellitus without complications: Secondary | ICD-10-CM | POA: Diagnosis not present

## 2021-01-19 DIAGNOSIS — Z955 Presence of coronary angioplasty implant and graft: Secondary | ICD-10-CM | POA: Diagnosis not present

## 2021-01-19 DIAGNOSIS — R0600 Dyspnea, unspecified: Secondary | ICD-10-CM | POA: Diagnosis not present

## 2021-01-19 DIAGNOSIS — I10 Essential (primary) hypertension: Secondary | ICD-10-CM | POA: Diagnosis not present

## 2021-01-19 DIAGNOSIS — I5022 Chronic systolic (congestive) heart failure: Secondary | ICD-10-CM | POA: Diagnosis not present

## 2021-02-01 ENCOUNTER — Ambulatory Visit: Admission: RE | Admit: 2021-02-01 | Payer: Medicare HMO | Source: Home / Self Care | Admitting: Internal Medicine

## 2021-02-01 ENCOUNTER — Encounter: Admission: RE | Payer: Self-pay | Source: Home / Self Care

## 2021-02-01 SURGERY — COLONOSCOPY WITH PROPOFOL
Anesthesia: General

## 2021-02-25 DIAGNOSIS — R0600 Dyspnea, unspecified: Secondary | ICD-10-CM | POA: Diagnosis not present

## 2021-02-25 DIAGNOSIS — I5022 Chronic systolic (congestive) heart failure: Secondary | ICD-10-CM | POA: Diagnosis not present

## 2021-02-25 DIAGNOSIS — I2510 Atherosclerotic heart disease of native coronary artery without angina pectoris: Secondary | ICD-10-CM | POA: Diagnosis not present

## 2021-02-25 DIAGNOSIS — E119 Type 2 diabetes mellitus without complications: Secondary | ICD-10-CM | POA: Diagnosis not present

## 2021-02-25 DIAGNOSIS — I10 Essential (primary) hypertension: Secondary | ICD-10-CM | POA: Diagnosis not present

## 2021-02-25 DIAGNOSIS — Z955 Presence of coronary angioplasty implant and graft: Secondary | ICD-10-CM | POA: Diagnosis not present

## 2021-02-25 DIAGNOSIS — E782 Mixed hyperlipidemia: Secondary | ICD-10-CM | POA: Diagnosis not present

## 2021-02-25 DIAGNOSIS — I208 Other forms of angina pectoris: Secondary | ICD-10-CM | POA: Diagnosis not present

## 2021-02-25 DIAGNOSIS — G4733 Obstructive sleep apnea (adult) (pediatric): Secondary | ICD-10-CM | POA: Diagnosis not present

## 2021-05-07 DIAGNOSIS — Z1159 Encounter for screening for other viral diseases: Secondary | ICD-10-CM | POA: Diagnosis not present

## 2021-05-07 DIAGNOSIS — I10 Essential (primary) hypertension: Secondary | ICD-10-CM | POA: Diagnosis not present

## 2021-05-07 DIAGNOSIS — E7800 Pure hypercholesterolemia, unspecified: Secondary | ICD-10-CM | POA: Diagnosis not present

## 2021-05-07 DIAGNOSIS — D649 Anemia, unspecified: Secondary | ICD-10-CM | POA: Diagnosis not present

## 2021-05-07 DIAGNOSIS — E785 Hyperlipidemia, unspecified: Secondary | ICD-10-CM | POA: Diagnosis not present

## 2021-05-07 DIAGNOSIS — E1169 Type 2 diabetes mellitus with other specified complication: Secondary | ICD-10-CM | POA: Diagnosis not present

## 2021-05-14 DIAGNOSIS — E1122 Type 2 diabetes mellitus with diabetic chronic kidney disease: Secondary | ICD-10-CM | POA: Diagnosis not present

## 2021-05-14 DIAGNOSIS — E1169 Type 2 diabetes mellitus with other specified complication: Secondary | ICD-10-CM | POA: Diagnosis not present

## 2021-05-14 DIAGNOSIS — E7800 Pure hypercholesterolemia, unspecified: Secondary | ICD-10-CM | POA: Diagnosis not present

## 2021-05-14 DIAGNOSIS — D631 Anemia in chronic kidney disease: Secondary | ICD-10-CM | POA: Diagnosis not present

## 2021-05-14 DIAGNOSIS — I129 Hypertensive chronic kidney disease with stage 1 through stage 4 chronic kidney disease, or unspecified chronic kidney disease: Secondary | ICD-10-CM | POA: Diagnosis not present

## 2021-05-14 DIAGNOSIS — N182 Chronic kidney disease, stage 2 (mild): Secondary | ICD-10-CM | POA: Diagnosis not present

## 2021-07-02 DIAGNOSIS — Z955 Presence of coronary angioplasty implant and graft: Secondary | ICD-10-CM | POA: Diagnosis not present

## 2021-07-02 DIAGNOSIS — I2510 Atherosclerotic heart disease of native coronary artery without angina pectoris: Secondary | ICD-10-CM | POA: Diagnosis not present

## 2021-07-02 DIAGNOSIS — G4733 Obstructive sleep apnea (adult) (pediatric): Secondary | ICD-10-CM | POA: Diagnosis not present

## 2021-07-02 DIAGNOSIS — I208 Other forms of angina pectoris: Secondary | ICD-10-CM | POA: Diagnosis not present

## 2021-07-02 DIAGNOSIS — E119 Type 2 diabetes mellitus without complications: Secondary | ICD-10-CM | POA: Diagnosis not present

## 2021-07-02 DIAGNOSIS — E782 Mixed hyperlipidemia: Secondary | ICD-10-CM | POA: Diagnosis not present

## 2021-07-02 DIAGNOSIS — I10 Essential (primary) hypertension: Secondary | ICD-10-CM | POA: Diagnosis not present

## 2021-07-02 DIAGNOSIS — R0609 Other forms of dyspnea: Secondary | ICD-10-CM | POA: Diagnosis not present

## 2021-07-02 DIAGNOSIS — I5022 Chronic systolic (congestive) heart failure: Secondary | ICD-10-CM | POA: Diagnosis not present

## 2021-10-14 DIAGNOSIS — M545 Low back pain, unspecified: Secondary | ICD-10-CM | POA: Diagnosis not present

## 2021-10-14 DIAGNOSIS — R8271 Bacteriuria: Secondary | ICD-10-CM | POA: Diagnosis not present

## 2021-10-14 DIAGNOSIS — R109 Unspecified abdominal pain: Secondary | ICD-10-CM | POA: Diagnosis not present

## 2021-10-14 DIAGNOSIS — R1031 Right lower quadrant pain: Secondary | ICD-10-CM | POA: Diagnosis not present

## 2021-11-05 DIAGNOSIS — D649 Anemia, unspecified: Secondary | ICD-10-CM | POA: Diagnosis not present

## 2021-11-05 DIAGNOSIS — E7800 Pure hypercholesterolemia, unspecified: Secondary | ICD-10-CM | POA: Diagnosis not present

## 2021-11-05 DIAGNOSIS — E1169 Type 2 diabetes mellitus with other specified complication: Secondary | ICD-10-CM | POA: Diagnosis not present

## 2021-11-05 DIAGNOSIS — E785 Hyperlipidemia, unspecified: Secondary | ICD-10-CM | POA: Diagnosis not present

## 2021-11-09 DIAGNOSIS — N1831 Chronic kidney disease, stage 3a: Secondary | ICD-10-CM | POA: Diagnosis not present

## 2021-11-09 DIAGNOSIS — E7800 Pure hypercholesterolemia, unspecified: Secondary | ICD-10-CM | POA: Diagnosis not present

## 2021-11-09 DIAGNOSIS — D631 Anemia in chronic kidney disease: Secondary | ICD-10-CM | POA: Diagnosis not present

## 2021-11-09 DIAGNOSIS — I129 Hypertensive chronic kidney disease with stage 1 through stage 4 chronic kidney disease, or unspecified chronic kidney disease: Secondary | ICD-10-CM | POA: Diagnosis not present

## 2021-11-09 DIAGNOSIS — E1169 Type 2 diabetes mellitus with other specified complication: Secondary | ICD-10-CM | POA: Diagnosis not present

## 2021-11-09 DIAGNOSIS — E1122 Type 2 diabetes mellitus with diabetic chronic kidney disease: Secondary | ICD-10-CM | POA: Diagnosis not present

## 2022-01-05 DIAGNOSIS — Z86010 Personal history of colonic polyps: Secondary | ICD-10-CM | POA: Diagnosis not present

## 2022-01-05 DIAGNOSIS — Z7902 Long term (current) use of antithrombotics/antiplatelets: Secondary | ICD-10-CM | POA: Diagnosis not present

## 2022-01-17 DIAGNOSIS — R0602 Shortness of breath: Secondary | ICD-10-CM | POA: Diagnosis not present

## 2022-01-17 DIAGNOSIS — Z955 Presence of coronary angioplasty implant and graft: Secondary | ICD-10-CM | POA: Diagnosis not present

## 2022-01-17 DIAGNOSIS — I2510 Atherosclerotic heart disease of native coronary artery without angina pectoris: Secondary | ICD-10-CM | POA: Diagnosis not present

## 2022-01-17 DIAGNOSIS — I5022 Chronic systolic (congestive) heart failure: Secondary | ICD-10-CM | POA: Diagnosis not present

## 2022-01-17 DIAGNOSIS — G4733 Obstructive sleep apnea (adult) (pediatric): Secondary | ICD-10-CM | POA: Diagnosis not present

## 2022-01-17 DIAGNOSIS — I10 Essential (primary) hypertension: Secondary | ICD-10-CM | POA: Diagnosis not present

## 2022-01-17 DIAGNOSIS — E782 Mixed hyperlipidemia: Secondary | ICD-10-CM | POA: Diagnosis not present

## 2022-01-17 DIAGNOSIS — R0609 Other forms of dyspnea: Secondary | ICD-10-CM | POA: Diagnosis not present

## 2022-01-19 DIAGNOSIS — R0609 Other forms of dyspnea: Secondary | ICD-10-CM | POA: Diagnosis not present

## 2022-01-19 DIAGNOSIS — J449 Chronic obstructive pulmonary disease, unspecified: Secondary | ICD-10-CM | POA: Diagnosis not present

## 2022-01-19 DIAGNOSIS — G4733 Obstructive sleep apnea (adult) (pediatric): Secondary | ICD-10-CM | POA: Diagnosis not present

## 2022-02-09 DIAGNOSIS — G4733 Obstructive sleep apnea (adult) (pediatric): Secondary | ICD-10-CM | POA: Diagnosis not present

## 2022-02-09 DIAGNOSIS — R0609 Other forms of dyspnea: Secondary | ICD-10-CM | POA: Diagnosis not present

## 2022-02-09 DIAGNOSIS — J432 Centrilobular emphysema: Secondary | ICD-10-CM | POA: Diagnosis not present

## 2022-03-15 DIAGNOSIS — E7800 Pure hypercholesterolemia, unspecified: Secondary | ICD-10-CM | POA: Diagnosis not present

## 2022-03-15 DIAGNOSIS — I10 Essential (primary) hypertension: Secondary | ICD-10-CM | POA: Diagnosis not present

## 2022-03-15 DIAGNOSIS — E785 Hyperlipidemia, unspecified: Secondary | ICD-10-CM | POA: Diagnosis not present

## 2022-03-15 DIAGNOSIS — E1169 Type 2 diabetes mellitus with other specified complication: Secondary | ICD-10-CM | POA: Diagnosis not present

## 2022-03-15 DIAGNOSIS — D649 Anemia, unspecified: Secondary | ICD-10-CM | POA: Diagnosis not present

## 2022-03-17 DIAGNOSIS — H26491 Other secondary cataract, right eye: Secondary | ICD-10-CM | POA: Diagnosis not present

## 2022-03-17 DIAGNOSIS — H43811 Vitreous degeneration, right eye: Secondary | ICD-10-CM | POA: Diagnosis not present

## 2022-03-17 DIAGNOSIS — E119 Type 2 diabetes mellitus without complications: Secondary | ICD-10-CM | POA: Diagnosis not present

## 2022-03-17 DIAGNOSIS — H2512 Age-related nuclear cataract, left eye: Secondary | ICD-10-CM | POA: Diagnosis not present

## 2022-03-22 DIAGNOSIS — D631 Anemia in chronic kidney disease: Secondary | ICD-10-CM | POA: Diagnosis not present

## 2022-03-22 DIAGNOSIS — E669 Obesity, unspecified: Secondary | ICD-10-CM | POA: Diagnosis not present

## 2022-03-22 DIAGNOSIS — E1169 Type 2 diabetes mellitus with other specified complication: Secondary | ICD-10-CM | POA: Diagnosis not present

## 2022-03-22 DIAGNOSIS — Z87891 Personal history of nicotine dependence: Secondary | ICD-10-CM | POA: Diagnosis not present

## 2022-03-22 DIAGNOSIS — E785 Hyperlipidemia, unspecified: Secondary | ICD-10-CM | POA: Diagnosis not present

## 2022-03-22 DIAGNOSIS — Z1389 Encounter for screening for other disorder: Secondary | ICD-10-CM | POA: Diagnosis not present

## 2022-03-22 DIAGNOSIS — N189 Chronic kidney disease, unspecified: Secondary | ICD-10-CM | POA: Diagnosis not present

## 2022-03-22 DIAGNOSIS — Z0000 Encounter for general adult medical examination without abnormal findings: Secondary | ICD-10-CM | POA: Diagnosis not present

## 2022-03-22 DIAGNOSIS — I129 Hypertensive chronic kidney disease with stage 1 through stage 4 chronic kidney disease, or unspecified chronic kidney disease: Secondary | ICD-10-CM | POA: Diagnosis not present

## 2022-03-31 ENCOUNTER — Ambulatory Visit (LOCAL_COMMUNITY_HEALTH_CENTER): Payer: Medicare HMO

## 2022-03-31 DIAGNOSIS — Z719 Counseling, unspecified: Secondary | ICD-10-CM

## 2022-03-31 DIAGNOSIS — Z23 Encounter for immunization: Secondary | ICD-10-CM

## 2022-03-31 NOTE — Progress Notes (Signed)
  Are you feeling sick today? No   Have you ever received a dose of COVID-19 Vaccine? [employer], [place], [place], [place], Other) Yes  If yes, which vaccine and how many doses?   PFIZER, 5   Did you bring the vaccination record card or other documentation?  Yes   Do you have a health condition or are undergoing treatment that makes you moderately or severely immunocompromised? This would include, but not be limited to: cancer, HIV, organ transplant, immunosuppressive therapy/high-dose corticosteroids, or moderate/severe primary immunodeficiency.  No  Have you received COVID-19 vaccine before or during hematopoietic cell transplant (HCT) or CAR-T-cell therapies? No  Have you ever had an allergic reaction to: (This would include a severe allergic reaction or a reaction that caused hives, swelling, or respiratory distress, including wheezing.) A component of a COVID-19 vaccine or a previous dose of COVID-19 vaccine? No   Have you ever had an allergic reaction to another vaccine (other thanCOVID-19 vaccine) or an injectable medication? (This would include a severe allergic reaction or a reaction that caused hives, swelling, or respiratory distress, including wheezing.)   No    Do you have a history of any of the following:  Myocarditis or Pericarditis No  Dermal fillers:  No  Multisystem Inflammatory Syndrome (MIS-C or MIS-A)? No  COVID-19 disease within the past 3 months? No  Vaccinated with monkeypox vaccine in the last 4 weeks? No   Pt in clinic for Covid vaccine. Pt is eligible for Pfizer BV booster this year, administered and tolerated well. Gave VIS, NCIR and updated covid vaccine record card. M.Janani Chamber, LPN.

## 2022-04-05 ENCOUNTER — Encounter: Payer: Self-pay | Admitting: Internal Medicine

## 2022-04-06 ENCOUNTER — Ambulatory Visit: Payer: Medicare HMO | Admitting: Certified Registered Nurse Anesthetist

## 2022-04-06 ENCOUNTER — Ambulatory Visit
Admission: RE | Admit: 2022-04-06 | Discharge: 2022-04-06 | Disposition: A | Discharge status: Home / Self Care | Payer: Medicare HMO | Attending: Internal Medicine | Admitting: Internal Medicine

## 2022-04-06 ENCOUNTER — Encounter: Payer: Self-pay | Admitting: Internal Medicine

## 2022-04-06 ENCOUNTER — Encounter
Admission: RE | Disposition: A | Discharge status: Home / Self Care | Payer: Self-pay | Source: Home / Self Care | Attending: Internal Medicine

## 2022-04-06 DIAGNOSIS — Z1211 Encounter for screening for malignant neoplasm of colon: Secondary | ICD-10-CM | POA: Diagnosis not present

## 2022-04-06 DIAGNOSIS — K640 First degree hemorrhoids: Secondary | ICD-10-CM | POA: Insufficient documentation

## 2022-04-06 DIAGNOSIS — E119 Type 2 diabetes mellitus without complications: Secondary | ICD-10-CM | POA: Diagnosis not present

## 2022-04-06 DIAGNOSIS — Z86010 Personal history of colonic polyps: Secondary | ICD-10-CM | POA: Diagnosis not present

## 2022-04-06 DIAGNOSIS — I10 Essential (primary) hypertension: Secondary | ICD-10-CM | POA: Diagnosis not present

## 2022-04-06 DIAGNOSIS — D124 Benign neoplasm of descending colon: Secondary | ICD-10-CM | POA: Insufficient documentation

## 2022-04-06 DIAGNOSIS — K649 Unspecified hemorrhoids: Secondary | ICD-10-CM | POA: Diagnosis not present

## 2022-04-06 DIAGNOSIS — G4730 Sleep apnea, unspecified: Secondary | ICD-10-CM | POA: Diagnosis not present

## 2022-04-06 DIAGNOSIS — K635 Polyp of colon: Secondary | ICD-10-CM | POA: Diagnosis not present

## 2022-04-06 HISTORY — PX: COLONOSCOPY: SHX5424

## 2022-04-06 SURGERY — COLONOSCOPY
Anesthesia: General

## 2022-04-06 MED ORDER — PROPOFOL 500 MG/50ML IV EMUL
INTRAVENOUS | Status: DC | PRN
  Administered 2022-04-06: 150 ug/kg/min via INTRAVENOUS

## 2022-04-06 MED ORDER — PROPOFOL 10 MG/ML IV BOLUS
INTRAVENOUS | Status: DC | PRN
  Administered 2022-04-06: 60 mg via INTRAVENOUS

## 2022-04-06 MED ORDER — SODIUM CHLORIDE 0.9 % IV SOLN
INTRAVENOUS | Status: DC
  Administered 2022-04-06: 1000 mL via INTRAVENOUS

## 2022-04-06 MED ORDER — LIDOCAINE HCL (CARDIAC) PF 100 MG/5ML IV SOSY
PREFILLED_SYRINGE | INTRAVENOUS | Status: DC | PRN
  Administered 2022-04-06: 50 mg via INTRAVENOUS

## 2022-04-06 NOTE — Anesthesia Procedure Notes (Signed)
Procedure Name: MAC Date/Time: 04/06/2022 9:12 AM  Performed by: Tollie Eth, CRNAPre-anesthesia Checklist: Patient identified, Emergency Drugs available, Suction available and Patient being monitored Patient Re-evaluated:Patient Re-evaluated prior to induction Oxygen Delivery Method: Nasal cannula Induction Type: IV induction Placement Confirmation: positive ETCO2

## 2022-04-06 NOTE — Transfer of Care (Signed)
Immediate Anesthesia Transfer of Care Note  Patient: Eric Browning  Procedure(s) Performed: COLONOSCOPY  Patient Location: Endoscopy Unit  Anesthesia Type:General  Level of Consciousness: awake, alert  and oriented  Airway & Oxygen Therapy: Patient Spontanous Breathing  Post-op Assessment: Report given to RN and Post -op Vital signs reviewed and stable  Post vital signs: Reviewed and stable  Last Vitals:  Vitals Value Taken Time  BP    Temp    Pulse    Resp    SpO2      Last Pain:  Vitals:   04/06/22 0825  TempSrc: Temporal  PainSc: 0-No pain         Complications: No notable events documented.

## 2022-04-06 NOTE — Anesthesia Postprocedure Evaluation (Signed)
Anesthesia Post Note  Patient: Antwoin Lackey  Procedure(s) Performed: COLONOSCOPY  Patient location during evaluation: Endoscopy Anesthesia Type: General Level of consciousness: awake and alert Pain management: pain level controlled Vital Signs Assessment: post-procedure vital signs reviewed and stable Respiratory status: spontaneous breathing, nonlabored ventilation, respiratory function stable and patient connected to nasal cannula oxygen Cardiovascular status: blood pressure returned to baseline and stable Postop Assessment: no apparent nausea or vomiting Anesthetic complications: no   No notable events documented.   Last Vitals:  Vitals:   04/06/22 0950 04/06/22 1000  BP: 132/84 137/84  Pulse: 93   Resp: 18 18  Temp:    SpO2: 99% 98%    Last Pain:  Vitals:   04/06/22 1000  TempSrc:   PainSc: 0-No pain                 Precious Haws Oaklan Persons

## 2022-04-06 NOTE — Op Note (Signed)
[facility] Gastroenterology Patient Name: Eric Browning Procedure Date: 04/06/2022 9:13 AM MRN: [id number] Account #: [id number] Date of Birth: [birth year] Admit Type: Outpatient Age: 71 Room: [facility] ENDO ROOM 2 Gender: Male Note Status: Finalized Instrument Name: Jasper Riling [id number] Procedure:             Colonoscopy Indications:           High risk colon cancer surveillance: Personal history                         of colonic polyps Providers:             Benay Pike. Alice Reichert MD, MD Referring MD:          Dion Body (Referring MD) Medicines:             Propofol per Anesthesia Complications:         No immediate complications. Procedure:             Pre-Anesthesia Assessment:                        - The risks and benefits of the procedure and the                         sedation options and risks were discussed with the                         patient. All questions were answered and informed                         consent was obtained.                        - Patient identification and proposed procedure were                         verified prior to the procedure by the nurse. The                         procedure was verified in the procedure room.                        - ASA Grade Assessment: III - A patient with severe                         systemic disease.                        - After reviewing the risks and benefits, the patient                         was deemed in satisfactory condition to undergo the                         procedure.                        After obtaining informed consent, the colonoscope was                         passed under direct vision.  Throughout the procedure,                         the patient's blood pressure, pulse, and oxygen                         saturations were monitored continuously. The                         Colonoscope was introduced through the anus and                         advanced to the the  cecum, identified by appendiceal                         orifice and ileocecal valve. The colonoscopy was                         performed without difficulty. The patient tolerated                         the procedure well. The quality of the bowel                         preparation was good. The ileocecal valve, appendiceal                         orifice, and rectum were photographed. Findings:      The perianal and digital rectal examinations were normal. Pertinent       negatives include normal sphincter tone and no palpable rectal lesions.      Non-bleeding internal hemorrhoids were found during retroflexion. The       hemorrhoids were Grade I (internal hemorrhoids that do not prolapse).      Three sessile polyps were found in the descending colon. The polyps were       3 to 10 mm in size. These polyps were removed with a hot snare.       Resection and retrieval were complete.      The exam was otherwise without abnormality. Impression:            - Non-bleeding internal hemorrhoids.                        - Three 3 to 10 mm polyps in the descending colon,                         removed with a hot snare. Resected and retrieved.                        - The examination was otherwise normal. Recommendation:        - Patient has a contact number available for                         emergencies. The signs and symptoms of potential                         delayed complications were discussed with the patient.  Return to normal activities tomorrow. Written                         discharge instructions were provided to the patient.                        - Resume previous diet.                        - Continue present medications.                        - Repeat colonoscopy is recommended for surveillance.                         The colonoscopy date will be determined after                         pathology results from today's exam become available                          for review.                        - Return to GI office PRN.                        - The findings and recommendations were discussed with                         the patient. Procedure Code(s):     --- Professional ---                        [id number], Colonoscopy, flexible; with removal of                         tumor(s), polyp(s), or other lesion(s) by snare                         technique Diagnosis Code(s):     --- Professional ---                        K64.0, First degree hemorrhoids                        K63.5, Polyp of colon                        Z86.010, Personal history of colonic polyps CPT copyright 2019 American Medical Association. All rights reserved. The codes documented in this report are preliminary and upon coder review may  be revised to meet current compliance requirements. Efrain Sella MD, MD 04/06/2022 9:41:13 AM This report has been signed electronically. Number of Addenda: 0 Note Initiated On: 04/06/2022 9:13 AM Scope Withdrawal Time: 0 hours 5 minutes 14 seconds  Total Procedure Duration: 0 hours 11 minutes 15 seconds  Estimated Blood Loss:  Estimated blood loss: none.      [facility]

## 2022-04-06 NOTE — Anesthesia Preprocedure Evaluation (Signed)
Anesthesia Evaluation  Patient identified by MRN, date of birth, ID band Patient awake    Reviewed: Allergy & Precautions, NPO status , Patient's Chart, lab work & pertinent test results  History of Anesthesia Complications Negative for: history of anesthetic complications  Airway Mallampati: III  TM Distance: >3 FB Neck ROM: full    Dental  (+) Missing   Pulmonary neg shortness of breath, sleep apnea , former smoker,    Pulmonary exam normal        Cardiovascular Exercise Tolerance: Good hypertension, (-) anginaNormal cardiovascular exam     Neuro/Psych negative neurological ROS  negative psych ROS   GI/Hepatic negative GI ROS, Neg liver ROS, neg GERD  ,  Endo/Other  diabetes, Type 2  Renal/GU negative Renal ROS  negative genitourinary   Musculoskeletal   Abdominal   Peds  Hematology negative hematology ROS (+)   Anesthesia Other Findings Past Medical History: No date: Arthritis     Comment:  hands No date: Diabetes ([place]) No date: Gout No date: Heart murmur     Comment:  not treated for this No date: High cholesterol No date: Hypertension  Past Surgical History: No date: CARDIAC CATHETERIZATION No date: COLONOSCOPY 08/03/2015: COLONOSCOPY WITH PROPOFOL; N/A     Comment:  Procedure: COLONOSCOPY WITH PROPOFOL;  Surgeon: Manya Silvas, MD;  Location: [facility] ENDOSCOPY;  Service:               Endoscopy;  Laterality: N/A; 12/31/2020: CORONARY STENT INTERVENTION; N/A     Comment:  Procedure: CORONARY STENT INTERVENTION;  Surgeon:               Yolonda Kida, MD;  Location: [place] CV LAB;               Service: Cardiovascular;  Laterality: N/A;  RCA 09/03/2015: EUS; N/A     Comment:  Procedure: LOWER ENDOSCOPIC ULTRASOUND (EUS);  Surgeon:               Holly Bodily, MD;  Location: [facility] ENDOSCOPY;                Service: Endoscopy;  Laterality: N/A; 2013: EYE SURGERY;  Right     Comment:  cataract extraction with iol implant 2017: JOINT REPLACEMENT; Right     Comment:  dr. Roland Rack 12/31/2020: LEFT HEART CATH AND CORONARY ANGIOGRAPHY; N/A     Comment:  Procedure: LEFT HEART CATH AND CORONARY ANGIOGRAPHY;                Surgeon: Yolonda Kida, MD;  Location: [place]              CV LAB;  Service: Cardiovascular;  Laterality: N/A; 12/12/2017: REVERSE SHOULDER ARTHROPLASTY; Right     Comment:  Procedure: REVERSE SHOULDER ARTHROPLASTY;  Surgeon:               Corky Mull, MD;  Location: [facility] ORS;  Service:               Orthopedics;  Laterality: Right; No date: right eye lens implant; Right No date: TONSILLECTOMY 05/12/2016: TOTAL KNEE ARTHROPLASTY; Right     Comment:  Procedure: TOTAL KNEE ARTHROPLASTY;  Surgeon: Corky Mull, MD;  Location: [facility] ORS;  Service: Orthopedics;  Laterality: Right;  BMI    Body Mass Index: 32.17 kg/m      Reproductive/Obstetrics negative OB ROS                             Anesthesia Physical Anesthesia Plan  ASA: 3  Anesthesia Plan: General   Post-op Pain Management:    Induction: Intravenous  PONV Risk Score and Plan: Propofol infusion and TIVA  Airway Management Planned: Natural Airway and Nasal Cannula  Additional Equipment:   Intra-op Plan:   Post-operative Plan:   Informed Consent: I have reviewed the patients History and Physical, chart, labs and discussed the procedure including the risks, benefits and alternatives for the proposed anesthesia with the patient or authorized representative who has indicated his/her understanding and acceptance.     Dental Advisory Given  Plan Discussed with: Anesthesiologist, CRNA and Surgeon  Anesthesia Plan Comments: (Patient consented for risks of anesthesia including but not limited to:  - adverse reactions to medications - risk of airway placement if required - damage to eyes, teeth, lips or  other oral mucosa - nerve damage due to positioning  - sore throat or hoarseness - Damage to heart, brain, nerves, lungs, other parts of body or loss of life  Patient voiced understanding.)        Anesthesia Quick Evaluation

## 2022-04-06 NOTE — H&P (Signed)
Outpatient short stay form Pre-procedure 04/06/2022 8:23 AM Eric Browning K. Alice Reichert, M.D.  Primary Physician: Dion Body, M.D.  Reason for visit:  Personal history of adenomatous colon polyps (2016)  History of present illness:   -Patient is due for repeat surveillance at this time. Last colonoscopy 07/2015 performed by Dr. Vira Agar showed lipoma in rectum, and three subcentimeter polyps with path showing TA, hyperplastic polyp, and pseudo-pneumatosis coli/focal debris -No red flag symptoms such as changes in bowel habits, anemia, unintentional weight loss, rectal bleeding   No current facility-administered medications for this encounter.  Medications Prior to Admission  Medication Sig Dispense Refill Last Dose   allopurinol (ZYLOPRIM) 300 MG tablet Take 300 mg by mouth daily.   Past Week   amLODipine (NORVASC) 10 MG tablet Take 1 tablet (10 mg total) by mouth daily. 30 tablet 6 04/06/2022 at 0600   aspirin EC 81 MG tablet Take 81 mg by mouth daily. Swallow whole.   Past Week   colchicine 0.6 MG tablet Take 0.6 mg by mouth daily as needed. For gout flare-ups   Past Week   hydrALAZINE (APRESOLINE) 25 MG tablet Take 1 tablet (25 mg total) by mouth 3 (three) times daily. 90 tablet 6 Past Week   hydrochlorothiazide (HYDRODIURIL) 25 MG tablet Take 1 tablet (25 mg total) by mouth daily. 30 tablet 6 Past Week   metFORMIN (GLUCOPHAGE) 500 MG tablet Take 500 mg by mouth 2 (two) times daily.    Past Week   metoprolol tartrate (LOPRESSOR) 25 MG tablet Take 25 mg by mouth 2 (two) times daily.   04/05/2022   olmesartan (BENICAR) 40 MG tablet Take 40 mg by mouth daily.   Past Week   potassium chloride SA (K-DUR,KLOR-CON) 20 MEQ tablet Take 20 mEq by mouth 3 (three) times daily.    Past Week   rosuvastatin (CRESTOR) 20 MG tablet Take 1 tablet (20 mg total) by mouth daily. 60 tablet 6 Past Week   tamsulosin (FLOMAX) 0.4 MG CAPS capsule Take 0.4 mg by mouth daily.   Past Week   ticagrelor (BRILINTA) 90 MG TABS  tablet Take 1 tablet (90 mg total) by mouth 2 (two) times daily. 60 tablet 6 03/29/2022     No Known Allergies   Past Medical History:  Diagnosis Date   Arthritis    hands   Diabetes ([place])    Gout    Heart murmur    not treated for this   High cholesterol    Hypertension     Review of systems:  Otherwise negative.    Physical Exam  Gen: Alert, oriented. Appears stated age.  HEENT: [place]/AT. PERRLA. Lungs: CTA, no wheezes. CV: RR nl S1, S2. Abd: soft, benign, no masses. BS+ Ext: No edema. Pulses 2+    Planned procedures: Proceed with colonoscopy. The patient understands the nature of the planned procedure, indications, risks, alternatives and potential complications including but not limited to bleeding, infection, perforation, damage to internal organs and possible oversedation/side effects from anesthesia. The patient agrees and gives consent to proceed.  Please refer to procedure notes for findings, recommendations and patient disposition/instructions.     Hadasa Gasner K. Alice Reichert, M.D. Gastroenterology 04/06/2022  8:23 AM

## 2022-04-07 ENCOUNTER — Encounter: Payer: Self-pay | Admitting: Internal Medicine

## 2022-04-07 LAB — SURGICAL PATHOLOGY

## 2022-06-14 DIAGNOSIS — G4733 Obstructive sleep apnea (adult) (pediatric): Secondary | ICD-10-CM | POA: Diagnosis not present

## 2022-06-14 DIAGNOSIS — R0609 Other forms of dyspnea: Secondary | ICD-10-CM | POA: Diagnosis not present

## 2022-06-14 DIAGNOSIS — Z23 Encounter for immunization: Secondary | ICD-10-CM | POA: Diagnosis not present

## 2022-07-12 ENCOUNTER — Ambulatory Visit (LOCAL_COMMUNITY_HEALTH_CENTER): Payer: Medicare HMO

## 2022-07-12 DIAGNOSIS — Z23 Encounter for immunization: Secondary | ICD-10-CM

## 2022-07-12 DIAGNOSIS — Z719 Counseling, unspecified: Secondary | ICD-10-CM

## 2022-07-12 NOTE — Progress Notes (Signed)
  Are you feeling sick today? No   Have you ever received a dose of COVID-19 Vaccine? [employer], [place], [place], [place], Other) Yes  If yes, which vaccine and how many doses?   4 doses Pfizer   Did you bring the vaccination record card or other documentation?  Yes   Do you have a health condition or are undergoing treatment that makes you moderately or severely immunocompromised? This would include, but not be limited to: cancer, HIV, organ transplant, immunosuppressive therapy/high-dose corticosteroids, or moderate/severe primary immunodeficiency.  No  Have you received COVID-19 vaccine before or during hematopoietic cell transplant (HCT) or CAR-T-cell therapies? No  Have you ever had an allergic reaction to: (This would include a severe allergic reaction or a reaction that caused hives, swelling, or respiratory distress, including wheezing.) A component of a COVID-19 vaccine or a previous dose of COVID-19 vaccine? No   Have you ever had an allergic reaction to another vaccine (other thanCOVID-19 vaccine) or an injectable medication? (This would include a severe allergic reaction or a reaction that caused hives, swelling, or respiratory distress, including wheezing.)   No    Do you have a history of any of the following:  Myocarditis or Pericarditis No  Dermal fillers:  No  Multisystem Inflammatory Syndrome (MIS-C or MIS-A)? No  COVID-19 disease within the past 3 months? No  Vaccinated with monkeypox vaccine in the last 4 weeks? No  VIS provided.  IM in left deltoid.  Tolerated well. COVID card updated and NCIR updated and copy provided.  Waited 15 minutes.

## 2022-08-02 DIAGNOSIS — J40 Bronchitis, not specified as acute or chronic: Secondary | ICD-10-CM | POA: Diagnosis not present

## 2022-08-09 DIAGNOSIS — I10 Essential (primary) hypertension: Secondary | ICD-10-CM | POA: Diagnosis not present

## 2022-08-09 DIAGNOSIS — E782 Mixed hyperlipidemia: Secondary | ICD-10-CM | POA: Diagnosis not present

## 2022-08-09 DIAGNOSIS — E119 Type 2 diabetes mellitus without complications: Secondary | ICD-10-CM | POA: Diagnosis not present

## 2022-08-09 DIAGNOSIS — G4733 Obstructive sleep apnea (adult) (pediatric): Secondary | ICD-10-CM | POA: Diagnosis not present

## 2022-08-09 DIAGNOSIS — I5022 Chronic systolic (congestive) heart failure: Secondary | ICD-10-CM | POA: Diagnosis not present

## 2022-08-09 DIAGNOSIS — R0609 Other forms of dyspnea: Secondary | ICD-10-CM | POA: Diagnosis not present

## 2022-08-09 DIAGNOSIS — I2510 Atherosclerotic heart disease of native coronary artery without angina pectoris: Secondary | ICD-10-CM | POA: Diagnosis not present

## 2022-08-09 DIAGNOSIS — R0602 Shortness of breath: Secondary | ICD-10-CM | POA: Diagnosis not present

## 2022-08-09 DIAGNOSIS — Z955 Presence of coronary angioplasty implant and graft: Secondary | ICD-10-CM | POA: Diagnosis not present

## 2022-09-15 DIAGNOSIS — E1169 Type 2 diabetes mellitus with other specified complication: Secondary | ICD-10-CM | POA: Diagnosis not present

## 2022-09-15 DIAGNOSIS — D649 Anemia, unspecified: Secondary | ICD-10-CM | POA: Diagnosis not present

## 2022-09-15 DIAGNOSIS — E7800 Pure hypercholesterolemia, unspecified: Secondary | ICD-10-CM | POA: Diagnosis not present

## 2022-09-15 DIAGNOSIS — E785 Hyperlipidemia, unspecified: Secondary | ICD-10-CM | POA: Diagnosis not present

## 2022-09-22 DIAGNOSIS — D631 Anemia in chronic kidney disease: Secondary | ICD-10-CM | POA: Diagnosis not present

## 2022-09-22 DIAGNOSIS — E1122 Type 2 diabetes mellitus with diabetic chronic kidney disease: Secondary | ICD-10-CM | POA: Diagnosis not present

## 2022-09-22 DIAGNOSIS — E1169 Type 2 diabetes mellitus with other specified complication: Secondary | ICD-10-CM | POA: Diagnosis not present

## 2022-09-22 DIAGNOSIS — E7800 Pure hypercholesterolemia, unspecified: Secondary | ICD-10-CM | POA: Diagnosis not present

## 2022-09-22 DIAGNOSIS — I129 Hypertensive chronic kidney disease with stage 1 through stage 4 chronic kidney disease, or unspecified chronic kidney disease: Secondary | ICD-10-CM | POA: Diagnosis not present

## 2022-09-22 DIAGNOSIS — E785 Hyperlipidemia, unspecified: Secondary | ICD-10-CM | POA: Diagnosis not present

## 2022-09-22 DIAGNOSIS — N183 Chronic kidney disease, stage 3 unspecified: Secondary | ICD-10-CM | POA: Diagnosis not present

## 2022-10-18 DIAGNOSIS — G4733 Obstructive sleep apnea (adult) (pediatric): Secondary | ICD-10-CM | POA: Diagnosis not present

## 2022-10-18 DIAGNOSIS — J432 Centrilobular emphysema: Secondary | ICD-10-CM | POA: Diagnosis not present

## 2022-10-19 DIAGNOSIS — E785 Hyperlipidemia, unspecified: Secondary | ICD-10-CM | POA: Diagnosis not present

## 2022-10-19 DIAGNOSIS — I10 Essential (primary) hypertension: Secondary | ICD-10-CM | POA: Diagnosis not present

## 2022-10-19 DIAGNOSIS — N400 Enlarged prostate without lower urinary tract symptoms: Secondary | ICD-10-CM | POA: Diagnosis not present

## 2022-10-19 DIAGNOSIS — E1122 Type 2 diabetes mellitus with diabetic chronic kidney disease: Secondary | ICD-10-CM | POA: Diagnosis not present

## 2022-10-19 DIAGNOSIS — M1000 Idiopathic gout, unspecified site: Secondary | ICD-10-CM | POA: Diagnosis not present

## 2022-10-19 DIAGNOSIS — R809 Proteinuria, unspecified: Secondary | ICD-10-CM | POA: Diagnosis not present

## 2022-10-19 DIAGNOSIS — I509 Heart failure, unspecified: Secondary | ICD-10-CM | POA: Diagnosis not present

## 2022-10-19 DIAGNOSIS — D631 Anemia in chronic kidney disease: Secondary | ICD-10-CM | POA: Diagnosis not present

## 2022-10-19 DIAGNOSIS — N1832 Chronic kidney disease, stage 3b: Secondary | ICD-10-CM | POA: Diagnosis not present

## 2022-10-19 DIAGNOSIS — R8290 Unspecified abnormal findings in urine: Secondary | ICD-10-CM | POA: Diagnosis not present

## 2022-11-14 DIAGNOSIS — R809 Proteinuria, unspecified: Secondary | ICD-10-CM | POA: Diagnosis not present

## 2022-11-14 DIAGNOSIS — M1000 Idiopathic gout, unspecified site: Secondary | ICD-10-CM | POA: Diagnosis not present

## 2022-11-14 DIAGNOSIS — E1122 Type 2 diabetes mellitus with diabetic chronic kidney disease: Secondary | ICD-10-CM | POA: Diagnosis not present

## 2022-11-14 DIAGNOSIS — R8290 Unspecified abnormal findings in urine: Secondary | ICD-10-CM | POA: Diagnosis not present

## 2022-11-14 DIAGNOSIS — I509 Heart failure, unspecified: Secondary | ICD-10-CM | POA: Diagnosis not present

## 2022-11-14 DIAGNOSIS — E785 Hyperlipidemia, unspecified: Secondary | ICD-10-CM | POA: Diagnosis not present

## 2022-11-14 DIAGNOSIS — I10 Essential (primary) hypertension: Secondary | ICD-10-CM | POA: Diagnosis not present

## 2022-11-14 DIAGNOSIS — N1832 Chronic kidney disease, stage 3b: Secondary | ICD-10-CM | POA: Diagnosis not present

## 2022-11-14 DIAGNOSIS — N400 Enlarged prostate without lower urinary tract symptoms: Secondary | ICD-10-CM | POA: Diagnosis not present

## 2022-11-22 DIAGNOSIS — D631 Anemia in chronic kidney disease: Secondary | ICD-10-CM | POA: Diagnosis not present

## 2022-11-22 DIAGNOSIS — N400 Enlarged prostate without lower urinary tract symptoms: Secondary | ICD-10-CM | POA: Diagnosis not present

## 2022-11-22 DIAGNOSIS — E1122 Type 2 diabetes mellitus with diabetic chronic kidney disease: Secondary | ICD-10-CM | POA: Diagnosis not present

## 2022-11-22 DIAGNOSIS — R809 Proteinuria, unspecified: Secondary | ICD-10-CM | POA: Diagnosis not present

## 2022-11-22 DIAGNOSIS — E785 Hyperlipidemia, unspecified: Secondary | ICD-10-CM | POA: Diagnosis not present

## 2022-11-22 DIAGNOSIS — I509 Heart failure, unspecified: Secondary | ICD-10-CM | POA: Diagnosis not present

## 2022-11-22 DIAGNOSIS — M1000 Idiopathic gout, unspecified site: Secondary | ICD-10-CM | POA: Diagnosis not present

## 2022-11-22 DIAGNOSIS — I10 Essential (primary) hypertension: Secondary | ICD-10-CM | POA: Diagnosis not present

## 2022-11-22 DIAGNOSIS — N1832 Chronic kidney disease, stage 3b: Secondary | ICD-10-CM | POA: Diagnosis not present

## 2022-11-24 ENCOUNTER — Other Ambulatory Visit: Payer: Self-pay | Admitting: Nephrology

## 2022-11-24 DIAGNOSIS — D631 Anemia in chronic kidney disease: Secondary | ICD-10-CM

## 2022-11-24 DIAGNOSIS — E785 Hyperlipidemia, unspecified: Secondary | ICD-10-CM

## 2022-11-24 DIAGNOSIS — R809 Proteinuria, unspecified: Secondary | ICD-10-CM

## 2022-11-24 DIAGNOSIS — N1832 Chronic kidney disease, stage 3b: Secondary | ICD-10-CM

## 2022-11-24 DIAGNOSIS — E1122 Type 2 diabetes mellitus with diabetic chronic kidney disease: Secondary | ICD-10-CM

## 2022-12-13 ENCOUNTER — Ambulatory Visit
Admission: RE | Admit: 2022-12-13 | Discharge: 2022-12-13 | Disposition: A | Discharge status: Home / Self Care | Payer: Medicare HMO | Source: Ambulatory Visit | Attending: Nephrology | Admitting: Nephrology

## 2022-12-13 DIAGNOSIS — N1832 Chronic kidney disease, stage 3b: Secondary | ICD-10-CM | POA: Diagnosis not present

## 2022-12-13 DIAGNOSIS — D631 Anemia in chronic kidney disease: Secondary | ICD-10-CM | POA: Diagnosis not present

## 2022-12-13 DIAGNOSIS — N281 Cyst of kidney, acquired: Secondary | ICD-10-CM | POA: Diagnosis not present

## 2022-12-13 DIAGNOSIS — R809 Proteinuria, unspecified: Secondary | ICD-10-CM | POA: Insufficient documentation

## 2022-12-13 DIAGNOSIS — E1122 Type 2 diabetes mellitus with diabetic chronic kidney disease: Secondary | ICD-10-CM | POA: Insufficient documentation

## 2022-12-13 DIAGNOSIS — E785 Hyperlipidemia, unspecified: Secondary | ICD-10-CM | POA: Insufficient documentation

## 2022-12-13 DIAGNOSIS — N189 Chronic kidney disease, unspecified: Secondary | ICD-10-CM | POA: Diagnosis not present

## 2022-12-13 DIAGNOSIS — N2889 Other specified disorders of kidney and ureter: Secondary | ICD-10-CM

## 2022-12-13 HISTORY — DX: Other specified disorders of kidney and ureter: N28.89

## 2022-12-14 ENCOUNTER — Other Ambulatory Visit: Payer: Self-pay | Admitting: Nephrology

## 2022-12-14 DIAGNOSIS — N1832 Chronic kidney disease, stage 3b: Secondary | ICD-10-CM

## 2022-12-14 DIAGNOSIS — N2889 Other specified disorders of kidney and ureter: Secondary | ICD-10-CM

## 2022-12-14 DIAGNOSIS — I10 Essential (primary) hypertension: Secondary | ICD-10-CM

## 2022-12-22 ENCOUNTER — Ambulatory Visit
Admission: RE | Admit: 2022-12-22 | Discharge: 2022-12-22 | Disposition: A | Discharge status: Home / Self Care | Payer: Medicare HMO | Source: Ambulatory Visit | Attending: Nephrology | Admitting: Nephrology

## 2022-12-22 DIAGNOSIS — I10 Essential (primary) hypertension: Secondary | ICD-10-CM | POA: Insufficient documentation

## 2022-12-22 DIAGNOSIS — E1122 Type 2 diabetes mellitus with diabetic chronic kidney disease: Secondary | ICD-10-CM | POA: Diagnosis not present

## 2022-12-22 DIAGNOSIS — N1832 Chronic kidney disease, stage 3b: Secondary | ICD-10-CM | POA: Diagnosis not present

## 2022-12-22 DIAGNOSIS — N2889 Other specified disorders of kidney and ureter: Secondary | ICD-10-CM | POA: Diagnosis not present

## 2022-12-22 DIAGNOSIS — N281 Cyst of kidney, acquired: Secondary | ICD-10-CM | POA: Diagnosis not present

## 2022-12-22 MED ORDER — GADOBUTROL 1 MMOL/ML IV SOLN
10.0000 mL | Freq: Once | INTRAVENOUS | Status: AC | PRN
  Administered 2022-12-22: 10 mL via INTRAVENOUS

## 2023-01-12 ENCOUNTER — Ambulatory Visit: Payer: Medicare HMO | Admitting: Urology

## 2023-01-12 VITALS — BP 111/66 | HR 71 | Ht 67.0 in | Wt 202.0 lb

## 2023-01-12 DIAGNOSIS — N2889 Other specified disorders of kidney and ureter: Secondary | ICD-10-CM

## 2023-01-12 NOTE — Progress Notes (Signed)
I, [name], acting as a scribe for Eric Scotland, MD.,have documented all relevant documentation on the behalf of Eric Scotland, MD,as directed by  Eric Scotland, MD while in the presence of Eric Scotland, MD.  01/12/2023 10:30 AM   Eric Browning [birth year] [id number]  Referring provider: Lorain Childes, MD [address] [place],  [place] [address]  Chief Complaint  Patient presents with   Establish Care   renal mass    HPI: 72 year-old male with a personal history of a left renal mass presents today to establish care.   He has a personal history of CKD with a baseline creatinine around 1.9 and is followed by Dr. Suezanne Jacquet.  He underwent a renal ultrasound for further evaluation of his chronic kidney disease on December 13, 2022,  which indicated a left hypoechoic mass. On 12/26/2022,  he underwent MRI of the abdomen, with and without contrast, which indicated a 2.9 by 2.6 by 2.4 centimeter left anterior upper pole renal mass with enhancement; solid in nature and concerning for a renal cell carcinoma. It also indicates the lesion is fairly close to the hilum and adjacent structures including the pancreas.  He has multiple medical comorbidities including CHF, CAD, diabetes, amongst others. He also has a personal history of unstable angina, status post percutaneous intervention and drug eluding stent in 2022.   He doesn't think there is any family history of kidney cancer. His son had bone can rectal cancer. He is curious if a medication can control the mass.   PMH: Past Medical History:  Diagnosis Date   Arthritis    hands   Diabetes (HCC)    Gout    Heart murmur    not treated for this   High cholesterol    Hypertension     Surgical History: Past Surgical History:  Procedure Laterality Date   CARDIAC CATHETERIZATION     COLONOSCOPY     COLONOSCOPY N/A 04/06/2022   Procedure: COLONOSCOPY;  Surgeon: Toledo, Boykin Nearing, MD;  Location: [facility] ENDOSCOPY;   Service: Gastroenterology;  Laterality: N/A;   COLONOSCOPY WITH PROPOFOL N/A 08/03/2015   Procedure: COLONOSCOPY WITH PROPOFOL;  Surgeon: Scot Jun, MD;  Location: [facility] ENDOSCOPY;  Service: Endoscopy;  Laterality: N/A;   CORONARY STENT INTERVENTION N/A 12/31/2020   Procedure: CORONARY STENT INTERVENTION;  Surgeon: Alwyn Pea, MD;  Location: [facility] INVASIVE CV LAB;  Service: Cardiovascular;  Laterality: N/A;  RCA   EUS N/A 09/03/2015   Procedure: LOWER ENDOSCOPIC ULTRASOUND (EUS);  Surgeon: Bearl Mulberry, MD;  Location: [facility] ENDOSCOPY;  Service: Endoscopy;  Laterality: N/A;   EYE SURGERY Right 2013   cataract extraction with iol implant   JOINT REPLACEMENT Right 2017   dr. Joice Lofts   LEFT HEART CATH AND CORONARY ANGIOGRAPHY N/A 12/31/2020   Procedure: LEFT HEART CATH AND CORONARY ANGIOGRAPHY;  Surgeon: Alwyn Pea, MD;  Location: [facility] INVASIVE CV LAB;  Service: Cardiovascular;  Laterality: N/A;   REVERSE SHOULDER ARTHROPLASTY Right 12/12/2017   Procedure: REVERSE SHOULDER ARTHROPLASTY;  Surgeon: Christena Flake, MD;  Location: [facility] ORS;  Service: Orthopedics;  Laterality: Right;   right eye lens implant Right    TONSILLECTOMY     TOTAL KNEE ARTHROPLASTY Right 05/12/2016   Procedure: TOTAL KNEE ARTHROPLASTY;  Surgeon: Christena Flake, MD;  Location: [facility] ORS;  Service: Orthopedics;  Laterality: Right;    Home Medications:  Allergies as of 01/12/2023   Eric Known Allergies      Medication  List        Accurate as of Jan 12, 2023 10:30 AM. If you have any questions, ask your nurse or doctor.          allopurinol 300 MG tablet Commonly known as: ZYLOPRIM Take 300 mg by mouth daily.   amLODipine 10 MG tablet Commonly known as: NORVASC Take 1 tablet (10 mg total) by mouth daily.   aspirin EC 81 MG tablet Take 81 mg by mouth daily. Swallow whole.   colchicine 0.6 MG tablet Take 0.6 mg by mouth daily as needed. For gout flare-ups   hydrALAZINE 25 MG  tablet Commonly known as: APRESOLINE Take 1 tablet (25 mg total) by mouth 3 (three) times daily.   hydrochlorothiazide 25 MG tablet Commonly known as: HYDRODIURIL Take 1 tablet (25 mg total) by mouth daily.   metFORMIN 500 MG tablet Commonly known as: GLUCOPHAGE Take 500 mg by mouth 2 (two) times daily.   metoprolol tartrate 25 MG tablet Commonly known as: LOPRESSOR Take 25 mg by mouth 2 (two) times daily.   olmesartan 40 MG tablet Commonly known as: BENICAR Take 40 mg by mouth daily.   potassium chloride SA 20 MEQ tablet Commonly known as: KLOR-CON M Take 20 mEq by mouth 3 (three) times daily.   rosuvastatin 20 MG tablet Commonly known as: CRESTOR Take 1 tablet (20 mg total) by mouth daily.   tamsulosin 0.4 MG Caps capsule Commonly known as: FLOMAX Take 0.4 mg by mouth daily.   ticagrelor 90 MG Tabs tablet Commonly known as: BRILINTA Take 1 tablet (90 mg total) by mouth 2 (two) times daily.        Family History: Family History  Problem Relation Age of Onset   Heart disease Mother    Stroke Father    Hypertension Father    Gout Father     Social History:  reports that he quit smoking about 34 years ago. His smoking use included cigarettes. He has a 30.00 pack-year smoking history. He has never used smokeless tobacco. He reports that he does not drink alcohol and does not use drugs.   Physical Exam: BP 111/66   Pulse 71   Ht 5\' 7"  (1.702 m)   Wt 202 lb (91.6 kg)   BMI 31.64 kg/m   Constitutional:  Alert and oriented, Eric acute distress.  Companied by his wife today. HEENT: [place] AT, moist mucus membranes.  Trachea midline, Eric masses. Neurologic: Grossly intact, Eric focal deficits, moving all 4 extremities. Psychiatric: Normal mood and affect.   Pertinent Imaging:  Results for orders placed during the hospital encounter of 12/13/22  US RENAL  Narrative CLINICAL DATA:  Anuria.  Chronic renal disease.  EXAM: RENAL / URINARY TRACT ULTRASOUND  COMPLETE  COMPARISON:  None Available.  FINDINGS: Right Kidney:  Renal measurements: 12.1 x 4.4 x 5.5 cm = volume: 154 mL. Diffuse increased echogenicity. Contains a 2.9 x 2.4 x 2.9 cm mildly complicated cyst. Eric follow-up imaging recommended for the cyst.  Left Kidney:  Renal measurements: 11.8 x 6.0 x 5.9 cm = volume: 220 mL. Diffuse increased echogenicity. Contains a hypoechoic mass with increased through transmission and Eric internal blood flow. A solid mass is not excluded. This mass measures 2.7 x 2.4 x 2.5 cm. A few tiny cysts are identified.  Bladder:  Appears normal for degree of bladder distention.  Other:  None.  IMPRESSION: 1. The kidneys are diffusely echogenic consistent with medical renal disease. 2. The left kidney contains a 2.7  x 2.4 x 2.5 cm hypoechoic mass with increased through transmission and Eric internal blood flow. This mass is indeterminate. A solid neoplasm is not excluded. Recommend MRI of the abdomen with and without contrast for further evaluation.  These results will be called to the ordering clinician or representative by the Radiologist Assistant, and communication documented in the PACS or [employer].  Electronically Signed By: Gerome Sam III M.D. On: 12/13/2022 15:54  CLINICAL DATA:  Left renal mass on ultrasound  EXAM: MRI ABDOMEN WITHOUT AND WITH CONTRAST   TECHNIQUE: Multiplanar multisequence MR imaging of the abdomen was performed both before and after the administration of intravenous contrast.   CONTRAST:  10mL GADAVIST GADOBUTROL 1 MMOL/ML IV SOLN   COMPARISON:  Renal ultrasound dated 12/13/2022   FINDINGS: Motion degraded images.   Lower chest: Lung bases are clear.   Hepatobiliary: Subcentimeter cyst in the posterior right liver (series 4/image 10). Liver is otherwise within normal limits. Eric hepatic steatosis.   Gallbladder is unremarkable. Eric intrahepatic or extrahepatic duct dilatation.    Pancreas:  Within normal limits.   Spleen:  Within normal limits.   Adrenals/Urinary Tract:  Adrenal glands are within normal limits.   2.9 x 2.6 x 2.4 cm enhancing mass in the anterior left upper kidney (series 4/image 16; series 16/image 28), compatible with renal neoplasm such as renal cell carcinoma. This corresponds to the sonographic abnormality.   Additional scattered simple renal cysts measuring up to 2.8 cm in the right lower kidney (series 4/image 19), benign (Bosniak I). At least 1 hemorrhagic cyst in the left lower kidney measuring 12 mm (series 4/image 22), with intrinsic T1 hyperintensity (series 13/image 45) but Eric convincing enhancement following contrast administration, noting motion degradation, benign (Bosniak II). Additional tiny hemorrhagic cysts are suspected (series 4/image 15; series 13/image 25) but poorly evaluated due to motion degradation.   Eric hydronephrosis.   Stomach/Bowel: Stomach is within normal limits.   Visualized bowel is unremarkable.   Vascular/Lymphatic:  Eric evidence of abdominal aortic aneurysm.   Eric suspicious abdominal lymphadenopathy.   Other:  Eric abdominal ascites.   Musculoskeletal: Eric focal osseous lesions.   IMPRESSION: Motion degraded images.   2.9 cm enhancing mass in the anterior left upper kidney, compatible with renal neoplasm such as renal cell carcinoma. This corresponds to the sonographic abnormality.   Additional bilateral renal cysts, as above, benign (Bosniak I-II).   Electronically Signed   By: Charline Bills M.D.   On: 12/26/2022 03:37 Personally reviewed both images and agree with radiologic interpretation.    Assessment & Plan:    Left renal mass  - A solid renal mass raises the suspicion of primary renal malignancy.  We discussed this in detail and in regards to the spectrum of renal masses which includes cysts (pure cysts are considered benign), solid masses and everything in between. The risk of  metastasis increases as the size of solid renal mass increases. In general, it is believed that the risk of metastasis for renal masses less than 3-4 cm is small (up to approximately 5%) based mainly on large retrospective studies.  - In some cases and especially in patients of older age and multiple comorbidities a surveillance approach may be appropriate. The treatment of solid renal masses includes: surveillance, cryoablation (percutaneous and laparoscopic) in addition to partial and complete nephrectomy (each with option of laparoscopic, robotic and open depending on appropriateness). Furthermore, nephrectomy appears to be an independent risk factor for the development of chronic kidney  disease suggesting that nephron sparing approaches should be implored whenever feasible. We reviewed these options in context of the patients current situation as well as the pros and cons of each.  -  He's not a good candidate for radical nephrectomy because of his CKD.  -Location of a tumor is technically challenging for partial nephrectomy and possibly local ablative therapy.  -In light of his medical comorbidities, we also discussed the option of surveillance. He's most interested in that.  -Discussed criteria for active surveillance as a relates to tumor size as well as growth rate, can reassess after repeat imaging  - MRI of the abdomen with and without contrast in six months and follow up appointment.   Return in about 6 months (around 07/15/2023) for follow-up renal MRI.  I have reviewed the above documentation for accuracy and completeness, and I agree with the above.   Eric Scotland, MD    [facility] [address], [address] [place], [place] [address] [phone]  I spent 45 total minutes on the day of the encounter including pre-visit review of the medical record, face-to-face time with the patient, and post visit ordering of labs/imaging/tests.

## 2023-01-17 DIAGNOSIS — R8290 Unspecified abnormal findings in urine: Secondary | ICD-10-CM | POA: Diagnosis not present

## 2023-01-17 DIAGNOSIS — E785 Hyperlipidemia, unspecified: Secondary | ICD-10-CM | POA: Diagnosis not present

## 2023-01-17 DIAGNOSIS — I10 Essential (primary) hypertension: Secondary | ICD-10-CM | POA: Diagnosis not present

## 2023-01-17 DIAGNOSIS — E1122 Type 2 diabetes mellitus with diabetic chronic kidney disease: Secondary | ICD-10-CM | POA: Diagnosis not present

## 2023-01-17 DIAGNOSIS — N400 Enlarged prostate without lower urinary tract symptoms: Secondary | ICD-10-CM | POA: Diagnosis not present

## 2023-01-17 DIAGNOSIS — I509 Heart failure, unspecified: Secondary | ICD-10-CM | POA: Diagnosis not present

## 2023-01-17 DIAGNOSIS — R809 Proteinuria, unspecified: Secondary | ICD-10-CM | POA: Diagnosis not present

## 2023-01-17 DIAGNOSIS — N1832 Chronic kidney disease, stage 3b: Secondary | ICD-10-CM | POA: Diagnosis not present

## 2023-01-17 DIAGNOSIS — M1000 Idiopathic gout, unspecified site: Secondary | ICD-10-CM | POA: Diagnosis not present

## 2023-01-24 DIAGNOSIS — I10 Essential (primary) hypertension: Secondary | ICD-10-CM | POA: Diagnosis not present

## 2023-01-24 DIAGNOSIS — R809 Proteinuria, unspecified: Secondary | ICD-10-CM | POA: Diagnosis not present

## 2023-01-24 DIAGNOSIS — M1000 Idiopathic gout, unspecified site: Secondary | ICD-10-CM | POA: Diagnosis not present

## 2023-01-24 DIAGNOSIS — N1832 Chronic kidney disease, stage 3b: Secondary | ICD-10-CM | POA: Diagnosis not present

## 2023-01-24 DIAGNOSIS — D631 Anemia in chronic kidney disease: Secondary | ICD-10-CM | POA: Diagnosis not present

## 2023-01-24 DIAGNOSIS — E785 Hyperlipidemia, unspecified: Secondary | ICD-10-CM | POA: Diagnosis not present

## 2023-01-24 DIAGNOSIS — N400 Enlarged prostate without lower urinary tract symptoms: Secondary | ICD-10-CM | POA: Diagnosis not present

## 2023-01-24 DIAGNOSIS — E1122 Type 2 diabetes mellitus with diabetic chronic kidney disease: Secondary | ICD-10-CM | POA: Diagnosis not present

## 2023-01-24 DIAGNOSIS — I509 Heart failure, unspecified: Secondary | ICD-10-CM | POA: Diagnosis not present

## 2023-02-08 DIAGNOSIS — Z955 Presence of coronary angioplasty implant and graft: Secondary | ICD-10-CM | POA: Diagnosis not present

## 2023-02-08 DIAGNOSIS — R0602 Shortness of breath: Secondary | ICD-10-CM | POA: Diagnosis not present

## 2023-02-08 DIAGNOSIS — E119 Type 2 diabetes mellitus without complications: Secondary | ICD-10-CM | POA: Diagnosis not present

## 2023-02-08 DIAGNOSIS — I5022 Chronic systolic (congestive) heart failure: Secondary | ICD-10-CM | POA: Diagnosis not present

## 2023-02-08 DIAGNOSIS — I2510 Atherosclerotic heart disease of native coronary artery without angina pectoris: Secondary | ICD-10-CM | POA: Diagnosis not present

## 2023-02-08 DIAGNOSIS — E782 Mixed hyperlipidemia: Secondary | ICD-10-CM | POA: Diagnosis not present

## 2023-02-08 DIAGNOSIS — G4733 Obstructive sleep apnea (adult) (pediatric): Secondary | ICD-10-CM | POA: Diagnosis not present

## 2023-02-08 DIAGNOSIS — I10 Essential (primary) hypertension: Secondary | ICD-10-CM | POA: Diagnosis not present

## 2023-02-08 DIAGNOSIS — R42 Dizziness and giddiness: Secondary | ICD-10-CM | POA: Diagnosis not present

## 2023-02-16 DIAGNOSIS — I272 Pulmonary hypertension, unspecified: Secondary | ICD-10-CM | POA: Diagnosis not present

## 2023-02-16 DIAGNOSIS — R0602 Shortness of breath: Secondary | ICD-10-CM | POA: Diagnosis not present

## 2023-02-16 DIAGNOSIS — J439 Emphysema, unspecified: Secondary | ICD-10-CM | POA: Diagnosis not present

## 2023-03-06 DIAGNOSIS — R0602 Shortness of breath: Secondary | ICD-10-CM | POA: Diagnosis not present

## 2023-03-22 DIAGNOSIS — E7800 Pure hypercholesterolemia, unspecified: Secondary | ICD-10-CM | POA: Diagnosis not present

## 2023-03-22 DIAGNOSIS — E785 Hyperlipidemia, unspecified: Secondary | ICD-10-CM | POA: Diagnosis not present

## 2023-03-22 DIAGNOSIS — D649 Anemia, unspecified: Secondary | ICD-10-CM | POA: Diagnosis not present

## 2023-03-22 DIAGNOSIS — E1169 Type 2 diabetes mellitus with other specified complication: Secondary | ICD-10-CM | POA: Diagnosis not present

## 2023-03-29 DIAGNOSIS — Z1331 Encounter for screening for depression: Secondary | ICD-10-CM | POA: Diagnosis not present

## 2023-03-29 DIAGNOSIS — E785 Hyperlipidemia, unspecified: Secondary | ICD-10-CM | POA: Diagnosis not present

## 2023-03-29 DIAGNOSIS — D509 Iron deficiency anemia, unspecified: Secondary | ICD-10-CM | POA: Diagnosis not present

## 2023-03-29 DIAGNOSIS — E1122 Type 2 diabetes mellitus with diabetic chronic kidney disease: Secondary | ICD-10-CM | POA: Diagnosis not present

## 2023-03-29 DIAGNOSIS — Z87891 Personal history of nicotine dependence: Secondary | ICD-10-CM | POA: Diagnosis not present

## 2023-03-29 DIAGNOSIS — E669 Obesity, unspecified: Secondary | ICD-10-CM | POA: Diagnosis not present

## 2023-03-29 DIAGNOSIS — N189 Chronic kidney disease, unspecified: Secondary | ICD-10-CM | POA: Diagnosis not present

## 2023-03-29 DIAGNOSIS — Z0000 Encounter for general adult medical examination without abnormal findings: Secondary | ICD-10-CM | POA: Diagnosis not present

## 2023-03-29 DIAGNOSIS — I129 Hypertensive chronic kidney disease with stage 1 through stage 4 chronic kidney disease, or unspecified chronic kidney disease: Secondary | ICD-10-CM | POA: Diagnosis not present

## 2023-04-18 DIAGNOSIS — E785 Hyperlipidemia, unspecified: Secondary | ICD-10-CM | POA: Diagnosis not present

## 2023-04-18 DIAGNOSIS — M1000 Idiopathic gout, unspecified site: Secondary | ICD-10-CM | POA: Diagnosis not present

## 2023-04-18 DIAGNOSIS — N2889 Other specified disorders of kidney and ureter: Secondary | ICD-10-CM | POA: Diagnosis not present

## 2023-04-18 DIAGNOSIS — R809 Proteinuria, unspecified: Secondary | ICD-10-CM | POA: Diagnosis not present

## 2023-04-18 DIAGNOSIS — E1122 Type 2 diabetes mellitus with diabetic chronic kidney disease: Secondary | ICD-10-CM | POA: Diagnosis not present

## 2023-04-18 DIAGNOSIS — N400 Enlarged prostate without lower urinary tract symptoms: Secondary | ICD-10-CM | POA: Diagnosis not present

## 2023-04-18 DIAGNOSIS — N1832 Chronic kidney disease, stage 3b: Secondary | ICD-10-CM | POA: Diagnosis not present

## 2023-04-18 DIAGNOSIS — I10 Essential (primary) hypertension: Secondary | ICD-10-CM | POA: Diagnosis not present

## 2023-04-18 DIAGNOSIS — I509 Heart failure, unspecified: Secondary | ICD-10-CM | POA: Diagnosis not present

## 2023-04-25 DIAGNOSIS — E1122 Type 2 diabetes mellitus with diabetic chronic kidney disease: Secondary | ICD-10-CM | POA: Diagnosis not present

## 2023-04-25 DIAGNOSIS — I509 Heart failure, unspecified: Secondary | ICD-10-CM | POA: Diagnosis not present

## 2023-04-25 DIAGNOSIS — D631 Anemia in chronic kidney disease: Secondary | ICD-10-CM | POA: Diagnosis not present

## 2023-04-25 DIAGNOSIS — I10 Essential (primary) hypertension: Secondary | ICD-10-CM | POA: Diagnosis not present

## 2023-04-25 DIAGNOSIS — N1832 Chronic kidney disease, stage 3b: Secondary | ICD-10-CM | POA: Diagnosis not present

## 2023-04-25 DIAGNOSIS — R809 Proteinuria, unspecified: Secondary | ICD-10-CM | POA: Diagnosis not present

## 2023-04-25 DIAGNOSIS — M1000 Idiopathic gout, unspecified site: Secondary | ICD-10-CM | POA: Diagnosis not present

## 2023-04-25 DIAGNOSIS — E785 Hyperlipidemia, unspecified: Secondary | ICD-10-CM | POA: Diagnosis not present

## 2023-04-25 DIAGNOSIS — N400 Enlarged prostate without lower urinary tract symptoms: Secondary | ICD-10-CM | POA: Diagnosis not present

## 2023-05-09 ENCOUNTER — Encounter: Payer: Self-pay | Admitting: Internal Medicine

## 2023-05-09 ENCOUNTER — Inpatient Hospital Stay: Payer: Medicare HMO

## 2023-05-09 ENCOUNTER — Inpatient Hospital Stay: Payer: Medicare HMO | Attending: Internal Medicine | Admitting: Internal Medicine

## 2023-05-09 VITALS — BP 176/80 | HR 96 | Temp 98.3°F | Ht 67.0 in | Wt 202.4 lb

## 2023-05-09 DIAGNOSIS — Z809 Family history of malignant neoplasm, unspecified: Secondary | ICD-10-CM | POA: Diagnosis not present

## 2023-05-09 DIAGNOSIS — D649 Anemia, unspecified: Secondary | ICD-10-CM | POA: Insufficient documentation

## 2023-05-09 DIAGNOSIS — N183 Chronic kidney disease, stage 3 unspecified: Secondary | ICD-10-CM | POA: Diagnosis not present

## 2023-05-09 DIAGNOSIS — Z87891 Personal history of nicotine dependence: Secondary | ICD-10-CM | POA: Diagnosis not present

## 2023-05-09 DIAGNOSIS — Z79899 Other long term (current) drug therapy: Secondary | ICD-10-CM | POA: Diagnosis not present

## 2023-05-09 DIAGNOSIS — Z808 Family history of malignant neoplasm of other organs or systems: Secondary | ICD-10-CM | POA: Insufficient documentation

## 2023-05-09 LAB — CBC WITH DIFFERENTIAL/PLATELET
Abs Immature Granulocytes: 0.03 10*3/uL (ref 0.00–0.07)
Basophils Absolute: 0.1 10*3/uL (ref 0.0–0.1)
Basophils Relative: 1 %
Eosinophils Absolute: 0.2 10*3/uL (ref 0.0–0.5)
Eosinophils Relative: 3 %
HCT: 31.3 % — ABNORMAL LOW (ref 39.0–52.0)
Hemoglobin: 9.5 g/dL — ABNORMAL LOW (ref 13.0–17.0)
Immature Granulocytes: 0 %
Lymphocytes Relative: 18 %
Lymphs Abs: 1.7 10*3/uL (ref 0.7–4.0)
MCH: 24 pg — ABNORMAL LOW (ref 26.0–34.0)
MCHC: 30.4 g/dL (ref 30.0–36.0)
MCV: 79 fL — ABNORMAL LOW (ref 80.0–100.0)
Monocytes Absolute: 0.9 10*3/uL (ref 0.1–1.0)
Monocytes Relative: 9 %
Neutro Abs: 6.4 10*3/uL (ref 1.7–7.7)
Neutrophils Relative %: 69 %
Platelets: 420 10*3/uL — ABNORMAL HIGH (ref 150–400)
RBC: 3.96 MIL/uL — ABNORMAL LOW (ref 4.22–5.81)
RDW: 17.2 % — ABNORMAL HIGH (ref 11.5–15.5)
WBC: 9.3 10*3/uL (ref 4.0–10.5)
nRBC: 0 % (ref 0.0–0.2)

## 2023-05-09 LAB — RETICULOCYTES
Immature Retic Fract: 12.4 % (ref 2.3–15.9)
RBC.: 3.99 MIL/uL — ABNORMAL LOW (ref 4.22–5.81)
Retic Count, Absolute: 34.3 10*3/uL (ref 19.0–186.0)
Retic Ct Pct: 0.9 % (ref 0.4–3.1)

## 2023-05-09 LAB — IRON AND TIBC
Iron: 31 ug/dL — ABNORMAL LOW (ref 45–182)
Saturation Ratios: 8 % — ABNORMAL LOW (ref 17.9–39.5)
TIBC: 410 ug/dL (ref 250–450)
UIBC: 379 ug/dL

## 2023-05-09 LAB — TECHNOLOGIST SMEAR REVIEW: Plt Morphology: ADEQUATE

## 2023-05-09 LAB — VITAMIN B12: Vitamin B-12: 911 pg/mL (ref 180–914)

## 2023-05-09 LAB — LACTATE DEHYDROGENASE: LDH: 78 U/L — ABNORMAL LOW (ref 98–192)

## 2023-05-09 LAB — FOLATE: Folate: 9 ng/mL (ref >5.9)

## 2023-05-09 LAB — FERRITIN: Ferritin: 8 ng/mL — ABNORMAL LOW (ref 24–336)

## 2023-05-09 NOTE — Assessment & Plan Note (Addendum)
#   Chronic anemia-[AUG 2024-Hb 8.8; MCV- 76]microcytic recently getting worse.  Interestingly patient is mildly symptomatic. 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  This pill is unlikely to cause stomach upset or cause constipation.    # ETIOLOGY:[facility] AUG 2023- [[facility]].  I had a long discussion with patient regarding multiple other etiologies of anemia-including nutritional; malabsorption; primary bone marrow disorders etc. I recommend CBC CMP LDH peripheral smear; haptoglobin; erythropoietin; iron studies ferritin B12 folic acid; reticulocyte count; multiple myeloma panel.  Kappa lambda light chain ratio. HOLD of bone marrow Biopsy at this time; pending above work up.   # Discussed at length the pathophysiology of anemia from chronic kidney disease which includes decreased erythropoietin production; and decreased iron stores in the body.  I discussed that I would recommend using iron infusion/Venofer; along with Retacrit to maintain hemoglobin around 10.  I would recommend the goal hemoglobin less than 10 as there is a increased risk of thromboembolic events in the target hemoglobin is around 12 to 13.   I discuss the potential acute infusion reactions with IV iron; which are quite rare.  Patient understands the risk; will proceed with infusions/injections at next visit.  Also discussed the role of Retacrit boosting the hemoglobin.    # CKD stage III [Dr.Korrapati]  # Poorly controlled Blood pressures-  Systolic 170s-   Discussed compliance with medications.  And also follow-up with nephrology closely.  Thank you Dr.Korrpati for allowing me to participate in the care of your pleasant patient. Please do not hesitate to contact me with questions or concerns in the interim.  # DISPOSITION: # labs today- ordered.  # follow up 2-3 weeks- MD; no labs- possible venofer or retacrit-- Dr.B

## 2023-05-09 NOTE — Patient Instructions (Signed)
#  Recommend gentle iron [iron biglycinate; 28 mg ] 1 pill a day.  This pill is unlikely to cause stomach upset or cause constipation.  

## 2023-05-09 NOTE — Progress Notes (Signed)
[facility] CONSULT NOTE  Patient Care Team: Marisue Ivan, MD as PCP - General (Family Medicine) Earna Coder, MD as Consulting Physician (Oncology)  CHIEF COMPLAINTS/PURPOSE OF CONSULTATION: ANEMIA   HEMATOLOGY HISTORY  # ANEMIA[Hb; MCV-platelets- WBC; Iron sat; ferritin;  GFR- CT/US; EGD/colonoscopy-  # CKD- [Dr.]  HISTORY OF PRESENTING ILLNESS: Patient ambulating-independently.  Accompanied by wife.   Eric Browning 72 y.o.  adult pleasant patient was been referred to [place] for further evaluation of anemia.  Patient complains of only mild fatigue.  Denies any unusual chest pain.  Denies unusual cough.  Denies any unusual swelling in the legs.  Blood in stools: none; colo- 2023 [[facility]]; No EGD Blood in urine: none Difficulty swallowing:none Change of bowel movement/constipation: none Prior blood transfusion: none Prior history of blood loss: none Liver disease:none Chronic kidney disease: CKD stage III [neprhology] Alcohol: none Bariatric surgery: none   Prior evaluation with hematology:none Prior bone marrow biopsy:  none Oral iron: none  Prior IV iron infusions: none    Review of Systems  Constitutional:  Positive for malaise/fatigue. Negative for chills, diaphoresis, fever and weight loss.  HENT:  Negative for nosebleeds and sore throat.   Eyes:  Negative for double vision.  Respiratory:  Negative for cough, hemoptysis, sputum production, shortness of breath and wheezing.   Cardiovascular:  Negative for chest pain, palpitations, orthopnea and leg swelling.  Gastrointestinal:  Negative for abdominal pain, blood in stool, constipation, diarrhea, heartburn, melena, nausea and vomiting.  Genitourinary:  Negative for dysuria, frequency and urgency.  Musculoskeletal:  Negative for back pain and joint pain.  Skin: Negative.  Negative for itching and rash.  Neurological:  Negative for dizziness, tingling, focal weakness, weakness and headaches.   Endo/Heme/Allergies:  Does not bruise/bleed easily.  Psychiatric/Behavioral:  Negative for depression. The patient is not nervous/anxious and does not have insomnia.      MEDICAL HISTORY:  Past Medical History:  Diagnosis Date   Arthritis    hands   Diabetes (HCC)    Gout    Heart murmur    not treated for this   High cholesterol    Hypertension     SURGICAL HISTORY: Past Surgical History:  Procedure Laterality Date   CARDIAC CATHETERIZATION     COLONOSCOPY     COLONOSCOPY N/A 04/06/2022   Procedure: COLONOSCOPY;  Surgeon: Toledo, Boykin Nearing, MD;  Location: [facility] ENDOSCOPY;  Service: Gastroenterology;  Laterality: N/A;   COLONOSCOPY WITH PROPOFOL N/A 08/03/2015   Procedure: COLONOSCOPY WITH PROPOFOL;  Surgeon: Scot Jun, MD;  Location: [facility] ENDOSCOPY;  Service: Endoscopy;  Laterality: N/A;   CORONARY STENT INTERVENTION N/A 12/31/2020   Procedure: CORONARY STENT INTERVENTION;  Surgeon: Alwyn Pea, MD;  Location: [facility] INVASIVE CV LAB;  Service: Cardiovascular;  Laterality: N/A;  RCA   EUS N/A 09/03/2015   Procedure: LOWER ENDOSCOPIC ULTRASOUND (EUS);  Surgeon: Bearl Mulberry, MD;  Location: [facility] ENDOSCOPY;  Service: Endoscopy;  Laterality: N/A;   EYE SURGERY Right 2013   cataract extraction with iol implant   JOINT REPLACEMENT Right 2017   dr. Joice Lofts   LEFT HEART CATH AND CORONARY ANGIOGRAPHY N/A 12/31/2020   Procedure: LEFT HEART CATH AND CORONARY ANGIOGRAPHY;  Surgeon: Alwyn Pea, MD;  Location: [facility] INVASIVE CV LAB;  Service: Cardiovascular;  Laterality: N/A;   REVERSE SHOULDER ARTHROPLASTY Right 12/12/2017   Procedure: REVERSE SHOULDER ARTHROPLASTY;  Surgeon: Christena Flake, MD;  Location: [facility] ORS;  Service: Orthopedics;  Laterality: Right;   right  eye lens implant Right    TONSILLECTOMY     TOTAL KNEE ARTHROPLASTY Right 05/12/2016   Procedure: TOTAL KNEE ARTHROPLASTY;  Surgeon: Christena Flake, MD;  Location: [facility] ORS;  Service: Orthopedics;   Laterality: Right;    SOCIAL HISTORY: Social History   Socioeconomic History   Marital status: Married    Spouse name: Not on file   Number of children: Not on file   Years of education: Not on file   Highest education level: Not on file  Occupational History   Occupation: works in cigarette factory  Tobacco Use   Smoking status: Former    Current packs/day: 0.00    Average packs/day: 1 pack/day for 30.0 years (30.0 ttl pk-yrs)    Types: Cigarettes    Start date: 09/28/1958    Quit date: 09/28/1988    Years since quitting: 34.6   Smokeless tobacco: Never   Tobacco comments:    quit 1990  Vaping Use   Vaping status: Never Used  Substance and Sexual Activity   Alcohol use: No   Drug use: No   Sexual activity: Yes  Other Topics Concern   Not on file  Social History Narrative   Not on file   Social Determinants of Health   Financial Resource Strain: Medium Risk (03/29/2023)   Received from [facility]   Overall Financial Resource Strain (CARDIA)    Difficulty of Paying Living Expenses: Somewhat hard  Food Insecurity: No Food Insecurity (03/29/2023)   Received from [facility]   Hunger Vital Sign    Worried About Running Out of Food in the Last Year: Never true    Ran Out of Food in the Last Year: Never true  Transportation Needs: No Transportation Needs (03/29/2023)   Received from [facility] - Transportation    In the past 12 months, has lack of transportation kept you from medical appointments or from getting medications?: No    Lack of Transportation (Non-Medical): No  Physical Activity: Not on file  Stress: Not on file  Social Connections: Not on file  Intimate Partner Violence: Not on file    FAMILY HISTORY: Family History  Problem Relation Age of Onset   Heart disease Mother    Stroke Father    Hypertension Father    Gout Father    Cancer Brother    Bone cancer Son    Brain cancer Son      ALLERGIES:  has No Known Allergies.  MEDICATIONS:  Current Outpatient Medications  Medication Sig Dispense Refill   allopurinol (ZYLOPRIM) 300 MG tablet Take 300 mg by mouth daily.     amLODipine (NORVASC) 10 MG tablet Take 1 tablet (10 mg total) by mouth daily. 30 tablet 6   aspirin EC 81 MG tablet Take 81 mg by mouth daily. Swallow whole.     colchicine 0.6 MG tablet Take 0.6 mg by mouth daily as needed. For gout flare-ups     hydrALAZINE (APRESOLINE) 25 MG tablet Take 1 tablet (25 mg total) by mouth 3 (three) times daily. 90 tablet 6   hydrochlorothiazide (HYDRODIURIL) 25 MG tablet Take 1 tablet (25 mg total) by mouth daily. 30 tablet 6   metFORMIN (GLUCOPHAGE) 500 MG tablet Take 500 mg by mouth 2 (two) times daily.      metoprolol tartrate (LOPRESSOR) 25 MG tablet Take 25 mg by mouth 2 (two) times daily.     olmesartan (BENICAR) 40 MG tablet Take  40 mg by mouth daily.     potassium chloride SA (K-DUR,KLOR-CON) 20 MEQ tablet Take 20 mEq by mouth 3 (three) times daily.      rosuvastatin (CRESTOR) 20 MG tablet Take 1 tablet (20 mg total) by mouth daily. 60 tablet 6   tamsulosin (FLOMAX) 0.4 MG CAPS capsule Take 0.4 mg by mouth daily.     ticagrelor (BRILINTA) 90 MG TABS tablet Take 1 tablet (90 mg total) by mouth 2 (two) times daily. 60 tablet 6   No current facility-administered medications for this visit.     PHYSICAL EXAMINATION:   Vitals:   05/09/23 1054  BP: (!) 176/80  Pulse: 96  Temp: 98.3 F (36.8 C)  SpO2: 97%   Filed Weights   05/09/23 1054  Weight: 202 lb 6.4 oz (91.8 kg)    Physical Exam Vitals and nursing note reviewed.  HENT:     Head: Normocephalic and atraumatic.     Mouth/Throat:     Pharynx: Oropharynx is clear.  Eyes:     Extraocular Movements: Extraocular movements intact.     Pupils: Pupils are equal, round, and reactive to light.  Cardiovascular:     Rate and Rhythm: Normal rate and regular rhythm.     Heart sounds: Murmur heard.   Pulmonary:     Comments: Decreased breath sounds bilaterally.  Abdominal:     Palpations: Abdomen is soft.  Musculoskeletal:        General: Normal range of motion.     Cervical back: Normal range of motion.  Skin:    General: Skin is warm.  Neurological:     General: No focal deficit present.     Mental Status: He is alert and oriented to person, place, and time.  Psychiatric:        Behavior: Behavior normal.        Judgment: Judgment normal.      LABORATORY DATA:  I have reviewed the data as listed Lab Results  Component Value Date   WBC 9.3 05/09/2023   HGB 9.5 (L) 05/09/2023   HCT 31.3 (L) 05/09/2023   MCV 79.0 (L) 05/09/2023   PLT 420 (H) 05/09/2023   No results for input(s): "NA", "K", "CL", "CO2", "GLUCOSE", "BUN", "CREATININE", "CALCIUM", "GFRNONAA", "GFRAA", "PROT", "ALBUMIN", "AST", "ALT", "ALKPHOS", "BILITOT", "BILIDIR", "IBILI" in the last 8760 hours.   No results found.  ASSESSMENT & PLAN:   Symptomatic anemia # Chronic anemia-[AUG 2024-Hb 8.8; MCV- 76]microcytic recently getting worse.  Interestingly patient is mildly symptomatic. 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  This pill is unlikely to cause stomach upset or cause constipation.    # ETIOLOGY:colo AUG 2023- [[facility]].  I had a long discussion with patient regarding multiple other etiologies of anemia-including nutritional; malabsorption; primary bone marrow disorders etc. I recommend CBC CMP LDH peripheral smear; haptoglobin; erythropoietin; iron studies ferritin B12 folic acid; reticulocyte count; multiple myeloma panel.  Kappa lambda light chain ratio. HOLD of bone marrow Biopsy at this time; pending above work up.   # Discussed at length the pathophysiology of anemia from chronic kidney disease which includes decreased erythropoietin production; and decreased iron stores in the body.  I discussed that I would recommend using  iron infusion/Venofer; along with Retacrit to maintain hemoglobin around 10.  I would recommend the goal hemoglobin less than 10 as there is a increased risk of thromboembolic events in the target hemoglobin is around 12  to 13.   I discuss the potential acute infusion reactions with IV iron; which are quite rare.  Patient understands the risk; will proceed with infusions/injections at next visit.  Also discussed the role of Retacrit boosting the hemoglobin.    # CKD stage III [Dr.Korrapati]  # Poorly controlled Blood pressures-  Systolic 170s-   Discussed compliance with medications.  And also follow-up with nephrology closely.  Thank you Dr.Korrpati for allowing me to participate in the care of your pleasant patient. Please do not hesitate to contact me with questions or concerns in the interim.  # DISPOSITION: # labs today- ordered.  # follow up 2-3 weeks- MD; no labs- possible venofer or retacrit-- Dr.B    All questions were answered. The patient knows to call the clinic with any problems, questions or concerns.    Earna Coder, MD 05/09/2023 1:28 PM

## 2023-05-09 NOTE — Progress Notes (Signed)
Fatigue/weakness: yes Dyspena: yes Light headedness: no  Blood in stool: no

## 2023-05-10 LAB — KAPPA/LAMBDA LIGHT CHAINS
Kappa free light chain: 57.6 mg/L — ABNORMAL HIGH (ref 3.3–19.4)
Kappa, lambda light chain ratio: 3.31 — ABNORMAL HIGH (ref 0.26–1.65)
Lambda free light chains: 17.4 mg/L (ref 5.7–26.3)

## 2023-05-10 LAB — ERYTHROPOIETIN: Erythropoietin: 34.4 m[IU]/mL — ABNORMAL HIGH (ref 2.6–18.5)

## 2023-05-11 LAB — HAPTOGLOBIN: Haptoglobin: 150 mg/dL (ref 34–355)

## 2023-05-12 LAB — MULTIPLE MYELOMA PANEL, SERUM
Albumin SerPl Elph-Mcnc: 3.7 g/dL (ref 2.9–4.4)
Albumin/Glob SerPl: 1.4 (ref 0.7–1.7)
Alpha 1: 0.2 g/dL (ref 0.0–0.4)
Alpha2 Glob SerPl Elph-Mcnc: 0.7 g/dL (ref 0.4–1.0)
B-Globulin SerPl Elph-Mcnc: 1.1 g/dL (ref 0.7–1.3)
Gamma Glob SerPl Elph-Mcnc: 0.7 g/dL (ref 0.4–1.8)
Globulin, Total: 2.8 g/dL (ref 2.2–3.9)
IgA: 145 mg/dL (ref 61–437)
IgG (Immunoglobin G), Serum: 819 mg/dL (ref 603–1613)
IgM (Immunoglobulin M), Srm: 12 mg/dL — ABNORMAL LOW (ref 15–143)
Total Protein ELP: 6.5 g/dL (ref 6.0–8.5)

## 2023-05-15 DIAGNOSIS — H6121 Impacted cerumen, right ear: Secondary | ICD-10-CM | POA: Diagnosis not present

## 2023-05-15 DIAGNOSIS — R42 Dizziness and giddiness: Secondary | ICD-10-CM | POA: Diagnosis not present

## 2023-05-24 ENCOUNTER — Ambulatory Visit: Payer: Medicare HMO

## 2023-05-24 DIAGNOSIS — Z23 Encounter for immunization: Secondary | ICD-10-CM

## 2023-05-24 DIAGNOSIS — Z719 Counseling, unspecified: Secondary | ICD-10-CM

## 2023-05-24 NOTE — Progress Notes (Signed)
Pt seen in clinic for Flu and covid vaccines. Eligible, administered Flu high dose and Comirnaty 12y+, yr 2024-2025. Monitored for 15 min without problems. Given VIS and NCIR copy, explained and understood. M.Devaunte Gasparini, LPN.

## 2023-05-26 ENCOUNTER — Inpatient Hospital Stay: Payer: Medicare HMO

## 2023-05-26 ENCOUNTER — Inpatient Hospital Stay: Payer: Medicare HMO | Admitting: Internal Medicine

## 2023-05-26 ENCOUNTER — Encounter: Payer: Self-pay | Admitting: Internal Medicine

## 2023-05-26 VITALS — BP 131/79 | HR 84 | Temp 97.8°F | Ht 67.0 in | Wt 204.2 lb

## 2023-05-26 DIAGNOSIS — N183 Chronic kidney disease, stage 3 unspecified: Secondary | ICD-10-CM | POA: Diagnosis not present

## 2023-05-26 DIAGNOSIS — Z79899 Other long term (current) drug therapy: Secondary | ICD-10-CM | POA: Diagnosis not present

## 2023-05-26 DIAGNOSIS — Z87891 Personal history of nicotine dependence: Secondary | ICD-10-CM | POA: Diagnosis not present

## 2023-05-26 DIAGNOSIS — D649 Anemia, unspecified: Secondary | ICD-10-CM | POA: Diagnosis not present

## 2023-05-26 DIAGNOSIS — Z809 Family history of malignant neoplasm, unspecified: Secondary | ICD-10-CM | POA: Diagnosis not present

## 2023-05-26 DIAGNOSIS — Z808 Family history of malignant neoplasm of other organs or systems: Secondary | ICD-10-CM | POA: Diagnosis not present

## 2023-05-26 NOTE — Progress Notes (Signed)
Fatigue/weakness: no more than usual Dyspena: no Light headedness: no Blood in stool: no  Pt states he is taking otc iron, unsure of mg.

## 2023-05-26 NOTE — Assessment & Plan Note (Signed)
#   Chronic anemia-[AUG 2024-Hb 8.8; MCV- 76] microcytic recently getting worse.  Interestingly patient is mildly symptomatic. 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  This pill is unlikely to cause stomach upset or cause constipation. Discussed the potential acute infusion reactions with IV iron; which are quite rare.  Patient declines infusions at this time; wants to continue PO iron at this time. If not improved then consider venofer.   # ETIOLOGY:colo AUG 2023- [[facility]]- recommend EGD/ further work up- however patient declines at this time.   # CKD stage III [Dr.Korrapati]- M proetin; NEG; k/l= abnormal sec to CKD- stable.   # Poorly controlled Blood pressures-  Systolic 170s-   Discussed compliance with medications.  And also follow-up with nephrology closely.  # DISPOSITION: #  NO venofer or retacrit today # follow up1st week of dec 2024- MD; labs- cbc/bmp; iron studies; ferritin-possible venofer  Dr.B

## 2023-05-26 NOTE — Progress Notes (Signed)
[facility] CONSULT NOTE  Patient Care Team: Marisue Ivan, MD as PCP - General (Family Medicine) Earna Coder, MD as Consulting Physician (Oncology)  CHIEF COMPLAINTS/PURPOSE OF CONSULTATION: ANEMIA   HEMATOLOGY HISTORY  # ANEMIA[Hb; MCV-platelets- WBC; Iron sat; ferritin;  GFR- CT/US; EGD/colonoscopy-  # CKD- [Dr.]  HISTORY OF PRESENTING ILLNESS: Patient ambulating-independently.  Accompanied by wife.   Eric Browning 72 y.o.  adult pleasant patient with CKD and symptomatic anemia-iron deficiency is here for follow-up/ and review the lab work up.   Denies any worsening fatigue or nausea or vomiting. Taking iron pills every days. Pt states he is taking otc iron, unsure of m    Review of Systems  Constitutional:  Positive for malaise/fatigue. Negative for chills, diaphoresis, fever and weight loss.  HENT:  Negative for nosebleeds and sore throat.   Eyes:  Negative for double vision.  Respiratory:  Negative for cough, hemoptysis, sputum production, shortness of breath and wheezing.   Cardiovascular:  Negative for chest pain, palpitations, orthopnea and leg swelling.  Gastrointestinal:  Negative for abdominal pain, blood in stool, constipation, diarrhea, heartburn, melena, nausea and vomiting.  Genitourinary:  Negative for dysuria, frequency and urgency.  Musculoskeletal:  Negative for back pain and joint pain.  Skin: Negative.  Negative for itching and rash.  Neurological:  Negative for dizziness, tingling, focal weakness, weakness and headaches.  Endo/Heme/Allergies:  Does not bruise/bleed easily.  Psychiatric/Behavioral:  Negative for depression. The patient is not nervous/anxious and does not have insomnia.      MEDICAL HISTORY:  Past Medical History:  Diagnosis Date   Arthritis    hands   Diabetes (HCC)    Gout    Heart murmur    not treated for this   High cholesterol    Hypertension     SURGICAL HISTORY: Past Surgical History:   Procedure Laterality Date   CARDIAC CATHETERIZATION     COLONOSCOPY     COLONOSCOPY N/A 04/06/2022   Procedure: COLONOSCOPY;  Surgeon: Toledo, Boykin Nearing, MD;  Location: [facility] ENDOSCOPY;  Service: Gastroenterology;  Laterality: N/A;   COLONOSCOPY WITH PROPOFOL N/A 08/03/2015   Procedure: COLONOSCOPY WITH PROPOFOL;  Surgeon: Scot Jun, MD;  Location: [facility] ENDOSCOPY;  Service: Endoscopy;  Laterality: N/A;   CORONARY STENT INTERVENTION N/A 12/31/2020   Procedure: CORONARY STENT INTERVENTION;  Surgeon: Alwyn Pea, MD;  Location: [facility] INVASIVE CV LAB;  Service: Cardiovascular;  Laterality: N/A;  RCA   EUS N/A 09/03/2015   Procedure: LOWER ENDOSCOPIC ULTRASOUND (EUS);  Surgeon: Bearl Mulberry, MD;  Location: [facility] ENDOSCOPY;  Service: Endoscopy;  Laterality: N/A;   EYE SURGERY Right 2013   cataract extraction with iol implant   JOINT REPLACEMENT Right 2017   dr. Joice Lofts   LEFT HEART CATH AND CORONARY ANGIOGRAPHY N/A 12/31/2020   Procedure: LEFT HEART CATH AND CORONARY ANGIOGRAPHY;  Surgeon: Alwyn Pea, MD;  Location: [facility] INVASIVE CV LAB;  Service: Cardiovascular;  Laterality: N/A;   REVERSE SHOULDER ARTHROPLASTY Right 12/12/2017   Procedure: REVERSE SHOULDER ARTHROPLASTY;  Surgeon: Christena Flake, MD;  Location: [facility] ORS;  Service: Orthopedics;  Laterality: Right;   right eye lens implant Right    TONSILLECTOMY     TOTAL KNEE ARTHROPLASTY Right 05/12/2016   Procedure: TOTAL KNEE ARTHROPLASTY;  Surgeon: Christena Flake, MD;  Location: [facility] ORS;  Service: Orthopedics;  Laterality: Right;    SOCIAL HISTORY: Social History   Socioeconomic History   Marital status: Married    Spouse name:  Not on file   Number of children: Not on file   Years of education: Not on file   Highest education level: Not on file  Occupational History   Occupation: works in cigarette factory  Tobacco Use   Smoking status: Former    Current packs/day: 0.00    Average packs/day: 1 pack/day for  30.0 years (30.0 ttl pk-yrs)    Types: Cigarettes    Start date: 09/28/1958    Quit date: 09/28/1988    Years since quitting: 34.6   Smokeless tobacco: Never   Tobacco comments:    quit 1990  Vaping Use   Vaping status: Never Used  Substance and Sexual Activity   Alcohol use: No   Drug use: No   Sexual activity: Yes  Other Topics Concern   Not on file  Social History Narrative   Not on file   Social Determinants of Health   Financial Resource Strain: Medium Risk (03/29/2023)   Received [facility]   Overall Financial Resource Strain (CARDIA)    Difficulty of Paying Living Expenses: Somewhat hard  Food Insecurity: No Food Insecurity (03/29/2023)   Received from [facility]   Hunger Vital Sign    Worried About Running Out of Food in the Last Year: Never true    Ran Out of Food in the Last Year: Never true  Transportation Needs: No Transportation Needs (03/29/2023)   Received from [facility] - Transportation    In the past 12 months, has lack of transportation kept you from medical appointments or from getting medications?: No    Lack of Transportation (Non-Medical): No  Physical Activity: Not on file  Stress: Not on file  Social Connections: Not on file  Intimate Partner Violence: Not on file    FAMILY HISTORY: Family History  Problem Relation Age of Onset   Heart disease Mother    Stroke Father    Hypertension Father    Gout Father    Cancer Brother    Bone cancer Son    Brain cancer Son     ALLERGIES:  has No Known Allergies.  MEDICATIONS:  Current Outpatient Medications  Medication Sig Dispense Refill   allopurinol (ZYLOPRIM) 300 MG tablet Take 300 mg by mouth daily.     amLODipine (NORVASC) 10 MG tablet Take 1 tablet (10 mg total) by mouth daily. 30 tablet 6   aspirin EC 81 MG tablet Take 81 mg by mouth daily. Swallow whole.     colchicine 0.6 MG tablet Take 0.6 mg by mouth daily as needed.  For gout flare-ups     hydrALAZINE (APRESOLINE) 25 MG tablet Take 1 tablet (25 mg total) by mouth 3 (three) times daily. 90 tablet 6   hydrochlorothiazide (HYDRODIURIL) 25 MG tablet Take 1 tablet (25 mg total) by mouth daily. 30 tablet 6   metFORMIN (GLUCOPHAGE) 500 MG tablet Take 500 mg by mouth 2 (two) times daily.      metoprolol tartrate (LOPRESSOR) 25 MG tablet Take 25 mg by mouth 2 (two) times daily.     olmesartan (BENICAR) 40 MG tablet Take 40 mg by mouth daily.     potassium chloride SA (K-DUR,KLOR-CON) 20 MEQ tablet Take 20 mEq by mouth 3 (three) times daily.      rosuvastatin (CRESTOR) 20 MG tablet Take 1 tablet (20 mg total) by mouth daily. 60 tablet 6   tamsulosin (FLOMAX) 0.4 MG CAPS capsule Take 0.4 mg by mouth  daily.     ticagrelor (BRILINTA) 90 MG TABS tablet Take 1 tablet (90 mg total) by mouth 2 (two) times daily. 60 tablet 6   No current facility-administered medications for this visit.     PHYSICAL EXAMINATION:   Vitals:   05/26/23 0945 05/26/23 0954  BP: (!) 145/78 131/79  Pulse: 84   Temp: 97.8 F (36.6 C)   SpO2: 96%    Filed Weights   05/26/23 0945  Weight: 204 lb 3.2 oz (92.6 kg)    Physical Exam Vitals and nursing note reviewed.  HENT:     Head: Normocephalic and atraumatic.     Mouth/Throat:     Pharynx: Oropharynx is clear.  Eyes:     Extraocular Movements: Extraocular movements intact.     Pupils: Pupils are equal, round, and reactive to light.  Cardiovascular:     Rate and Rhythm: Normal rate and regular rhythm.     Heart sounds: Murmur heard.  Pulmonary:     Comments: Decreased breath sounds bilaterally.  Abdominal:     Palpations: Abdomen is soft.  Musculoskeletal:        General: Normal range of motion.     Cervical back: Normal range of motion.  Skin:    General: Skin is warm.  Neurological:     General: No focal deficit present.     Mental Status: He is alert and oriented to person, place, and time.  Psychiatric:         Behavior: Behavior normal.        Judgment: Judgment normal.      LABORATORY DATA:  I have reviewed the data as listed Lab Results  Component Value Date   WBC 9.3 05/09/2023   HGB 9.5 (L) 05/09/2023   HCT 31.3 (L) 05/09/2023   MCV 79.0 (L) 05/09/2023   PLT 420 (H) 05/09/2023   No results for input(s): "NA", "K", "CL", "CO2", "GLUCOSE", "BUN", "CREATININE", "CALCIUM", "GFRNONAA", "GFRAA", "PROT", "ALBUMIN", "AST", "ALT", "ALKPHOS", "BILITOT", "BILIDIR", "IBILI" in the last 8760 hours.   No results found.  ASSESSMENT & PLAN:   Symptomatic anemia # Chronic anemia-[AUG 2024-Hb 8.8; MCV- 76] microcytic recently getting worse.  Interestingly patient is mildly symptomatic. 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  This pill is unlikely to cause stomach upset or cause constipation. Discussed the potential acute infusion reactions with IV iron; which are quite rare.  Patient declines infusions at this time; wants to continue PO iron at this time. If not improved then consider venofer.   # ETIOLOGY:colo AUG 2023- [[facility]]- recommend EGD/ further work up- however patient declines at this time.   # CKD stage III [Dr.Korrapati]- M proetin; NEG; k/l= abnormal sec to CKD- stable.   # Poorly controlled Blood pressures-  Systolic 170s-   Discussed compliance with medications.  And also follow-up with nephrology closely.  # DISPOSITION: #  NO venofer or retacrit today # follow up1st week of dec 2024- MD; labs- cbc/bmp; iron studies; ferritin-possible venofer  Dr.B   All questions were answered. The patient knows to call the clinic with any problems, questions or concerns.    Earna Coder, MD 05/26/2023 10:49 AM

## 2023-06-08 DIAGNOSIS — E119 Type 2 diabetes mellitus without complications: Secondary | ICD-10-CM | POA: Diagnosis not present

## 2023-06-08 DIAGNOSIS — H2512 Age-related nuclear cataract, left eye: Secondary | ICD-10-CM | POA: Diagnosis not present

## 2023-06-08 DIAGNOSIS — H26491 Other secondary cataract, right eye: Secondary | ICD-10-CM | POA: Diagnosis not present

## 2023-06-08 DIAGNOSIS — Z0100 Encounter for examination of eyes and vision without abnormal findings: Secondary | ICD-10-CM | POA: Diagnosis not present

## 2023-06-08 DIAGNOSIS — Z961 Presence of intraocular lens: Secondary | ICD-10-CM | POA: Diagnosis not present

## 2023-06-14 ENCOUNTER — Ambulatory Visit
Admission: RE | Admit: 2023-06-14 | Discharge: 2023-06-14 | Disposition: A | Discharge status: Home / Self Care | Payer: Medicare HMO | Source: Ambulatory Visit | Attending: Urology | Admitting: Urology

## 2023-06-14 DIAGNOSIS — K449 Diaphragmatic hernia without obstruction or gangrene: Secondary | ICD-10-CM | POA: Diagnosis not present

## 2023-06-14 DIAGNOSIS — N281 Cyst of kidney, acquired: Secondary | ICD-10-CM | POA: Diagnosis not present

## 2023-06-14 DIAGNOSIS — N2889 Other specified disorders of kidney and ureter: Secondary | ICD-10-CM | POA: Diagnosis not present

## 2023-06-14 MED ORDER — GADOBUTROL 1 MMOL/ML IV SOLN
7.5000 mL | Freq: Once | INTRAVENOUS | Status: AC | PRN
  Administered 2023-06-14: 7.5 mL via INTRAVENOUS

## 2023-06-21 DIAGNOSIS — G4733 Obstructive sleep apnea (adult) (pediatric): Secondary | ICD-10-CM | POA: Diagnosis not present

## 2023-06-21 DIAGNOSIS — J439 Emphysema, unspecified: Secondary | ICD-10-CM | POA: Diagnosis not present

## 2023-06-21 DIAGNOSIS — R0609 Other forms of dyspnea: Secondary | ICD-10-CM | POA: Diagnosis not present

## 2023-06-22 DIAGNOSIS — E7800 Pure hypercholesterolemia, unspecified: Secondary | ICD-10-CM | POA: Diagnosis not present

## 2023-06-22 DIAGNOSIS — E1169 Type 2 diabetes mellitus with other specified complication: Secondary | ICD-10-CM | POA: Diagnosis not present

## 2023-06-22 DIAGNOSIS — E785 Hyperlipidemia, unspecified: Secondary | ICD-10-CM | POA: Diagnosis not present

## 2023-06-22 DIAGNOSIS — D649 Anemia, unspecified: Secondary | ICD-10-CM | POA: Diagnosis not present

## 2023-07-06 DIAGNOSIS — I129 Hypertensive chronic kidney disease with stage 1 through stage 4 chronic kidney disease, or unspecified chronic kidney disease: Secondary | ICD-10-CM | POA: Diagnosis not present

## 2023-07-06 DIAGNOSIS — N1832 Chronic kidney disease, stage 3b: Secondary | ICD-10-CM | POA: Diagnosis not present

## 2023-07-06 DIAGNOSIS — E7800 Pure hypercholesterolemia, unspecified: Secondary | ICD-10-CM | POA: Diagnosis not present

## 2023-07-06 DIAGNOSIS — E1122 Type 2 diabetes mellitus with diabetic chronic kidney disease: Secondary | ICD-10-CM | POA: Diagnosis not present

## 2023-07-06 DIAGNOSIS — N289 Disorder of kidney and ureter, unspecified: Secondary | ICD-10-CM | POA: Diagnosis not present

## 2023-07-06 DIAGNOSIS — D631 Anemia in chronic kidney disease: Secondary | ICD-10-CM | POA: Diagnosis not present

## 2023-07-06 DIAGNOSIS — E876 Hypokalemia: Secondary | ICD-10-CM | POA: Diagnosis not present

## 2023-07-19 ENCOUNTER — Ambulatory Visit: Payer: Medicare HMO | Admitting: Urology

## 2023-07-19 ENCOUNTER — Encounter: Payer: Self-pay | Admitting: Urology

## 2023-07-19 VITALS — BP 168/80 | HR 84 | Wt 202.6 lb

## 2023-07-19 DIAGNOSIS — N2889 Other specified disorders of kidney and ureter: Secondary | ICD-10-CM | POA: Diagnosis not present

## 2023-07-19 NOTE — Progress Notes (Signed)
Marcelle Overlie Plume,acting as a scribe for [name], MD.,have documented all relevant documentation on the behalf of [name], MD,as directed by  [name], MD while in the presence of [name], MD.  07/19/2023 11:54 AM   [name] 1951/01/08 [id number]  Referring provider: Marisue Ivan, MD [phone] [facility] [address] [facility] [place],  [place] [address]  Chief Complaint  Patient presents with   Follow-up    HPI: 72 year-old male who presents today for a 6 month follow up. He has a personal history of a left renal mass.   Please see previous note for details. He underwent MRI on 11/2022 that showed a 2.9 by 2.6 by 2.4 cm of the left anterior upper pole with enhancements. Given the relatively small size of the mass, his comorbidities, along with his CKD, we elected for conservative management.   He follows up today with an MRI completed on 06/21/2023 that was personally reviewed. There has been a slight interval increase in size, now measuring 3.2 cm in the largest diameter.   His most recent creatinine on 07/06/2023 is 2.2.   He has multiple medical comorbidities including CHF, CAD, diabetes, and unstable angina. He also has a drug-eluding stent.    PMH: Past Medical History:  Diagnosis Date   Arthritis    hands   Diabetes (HCC)    Gout    Heart murmur    not treated for this   High cholesterol    Hypertension     Surgical History: Past Surgical History:  Procedure Laterality Date   CARDIAC CATHETERIZATION     COLONOSCOPY     COLONOSCOPY N/A 04/06/2022   Procedure: COLONOSCOPY;  Surgeon: Toledo, Boykin Nearing, MD;  Location: [facility] ENDOSCOPY;  Service: Gastroenterology;  Laterality: N/A;   COLONOSCOPY WITH PROPOFOL N/A 08/03/2015   Procedure: COLONOSCOPY WITH PROPOFOL;  Surgeon: Scot Jun, MD;  Location: [facility] ENDOSCOPY;  Service: Endoscopy;  Laterality: N/A;   CORONARY STENT INTERVENTION N/A 12/31/2020   Procedure: CORONARY STENT  INTERVENTION;  Surgeon: Alwyn Pea, MD;  Location: [facility] INVASIVE CV LAB;  Service: Cardiovascular;  Laterality: N/A;  RCA   EUS N/A 09/03/2015   Procedure: LOWER ENDOSCOPIC ULTRASOUND (EUS);  Surgeon: Bearl Mulberry, MD;  Location: [facility] ENDOSCOPY;  Service: Endoscopy;  Laterality: N/A;   EYE SURGERY Right 2013   cataract extraction with iol implant   JOINT REPLACEMENT Right 2017   dr. Joice Lofts   LEFT HEART CATH AND CORONARY ANGIOGRAPHY N/A 12/31/2020   Procedure: LEFT HEART CATH AND CORONARY ANGIOGRAPHY;  Surgeon: Alwyn Pea, MD;  Location: [facility] INVASIVE CV LAB;  Service: Cardiovascular;  Laterality: N/A;   REVERSE SHOULDER ARTHROPLASTY Right 12/12/2017   Procedure: REVERSE SHOULDER ARTHROPLASTY;  Surgeon: Christena Flake, MD;  Location: [facility] ORS;  Service: Orthopedics;  Laterality: Right;   right eye lens implant Right    TONSILLECTOMY     TOTAL KNEE ARTHROPLASTY Right 05/12/2016   Procedure: TOTAL KNEE ARTHROPLASTY;  Surgeon: Christena Flake, MD;  Location: [facility] ORS;  Service: Orthopedics;  Laterality: Right;    Home Medications:  Allergies as of 07/19/2023   No Known Allergies      Medication List        Accurate as of July 19, 2023 11:54 AM. If you have any questions, ask your nurse or doctor.          allopurinol 300 MG tablet Commonly known as: ZYLOPRIM Take 300 mg by mouth daily.  amLODipine 10 MG tablet Commonly known as: NORVASC Take 1 tablet (10 mg total) by mouth daily.   aspirin EC 81 MG tablet Take 81 mg by mouth daily. Swallow whole.   clopidogrel 75 MG tablet Commonly known as: PLAVIX Take 75 mg by mouth daily.   colchicine 0.6 MG tablet Take 0.6 mg by mouth daily as needed. For gout flare-ups   furosemide 20 MG tablet Commonly known as: LASIX Take 20 mg by mouth daily.   hydrALAZINE 25 MG tablet Commonly known as: APRESOLINE Take 1 tablet (25 mg total) by mouth 3 (three) times daily.   hydrochlorothiazide 25 MG  tablet Commonly known as: HYDRODIURIL Take 1 tablet (25 mg total) by mouth daily.   metFORMIN 500 MG tablet Commonly known as: GLUCOPHAGE Take 500 mg by mouth 2 (two) times daily.   metoprolol tartrate 25 MG tablet Commonly known as: LOPRESSOR Take 25 mg by mouth 2 (two) times daily.   olmesartan 40 MG tablet Commonly known as: BENICAR Take 40 mg by mouth daily.   potassium chloride SA 20 MEQ tablet Commonly known as: KLOR-CON M Take 20 mEq by mouth 3 (three) times daily.   pravastatin 20 MG tablet Commonly known as: PRAVACHOL Take 20 mg by mouth daily.   rosuvastatin 20 MG tablet Commonly known as: CRESTOR Take 1 tablet (20 mg total) by mouth daily.   tamsulosin 0.4 MG Caps capsule Commonly known as: FLOMAX Take 0.4 mg by mouth daily.   ticagrelor 90 MG Tabs tablet Commonly known as: BRILINTA Take 1 tablet (90 mg total) by mouth 2 (two) times daily.        Family History: Family History  Problem Relation Age of Onset   Heart disease Mother    Stroke Father    Hypertension Father    Gout Father    Cancer Brother    Bone cancer Son    Brain cancer Son     Social History:  reports that he quit smoking about 34 years ago. His smoking use included cigarettes. He started smoking about 64 years ago. He has a 30 pack-year smoking history. He has never used smokeless tobacco. He reports that he does not drink alcohol and does not use drugs.   Physical Exam: BP (!) 168/80   Pulse 84   Wt 202 lb 9.6 oz (91.9 kg)   BMI 31.73 kg/m   Constitutional:  Alert and oriented, No acute distress. Accompanied by his wife today. HEENT: [place] AT, moist mucus membranes.  Trachea midline, no masses. Neurologic: Grossly intact, no focal deficits, moving all 4 extremities. Psychiatric: Normal mood and affect.  Pertinent Imaging: EXAM: MRI ABDOMEN WITHOUT AND WITH CONTRAST   TECHNIQUE: Multiplanar multisequence MR imaging of the abdomen was performed both before and after the  administration of intravenous contrast.   CONTRAST:  10mL GADAVIST GADOBUTROL 1 MMOL/ML IV SOLN   COMPARISON:  Renal ultrasound dated 12/13/2022   FINDINGS: Motion degraded images.   Lower chest: Lung bases are clear.   Hepatobiliary: Subcentimeter cyst in the posterior right liver (series 4/image 10). Liver is otherwise within normal limits. No hepatic steatosis.   Gallbladder is unremarkable. No intrahepatic or extrahepatic duct dilatation.   Pancreas:  Within normal limits.   Spleen:  Within normal limits.   Adrenals/Urinary Tract:  Adrenal glands are within normal limits.   2.9 x 2.6 x 2.4 cm enhancing mass in the anterior left upper kidney (series 4/image 16; series 16/image 28), compatible with renal neoplasm such as  renal cell carcinoma. This corresponds to the sonographic abnormality.   Additional scattered simple renal cysts measuring up to 2.8 cm in the right lower kidney (series 4/image 19), benign (Bosniak I). At least 1 hemorrhagic cyst in the left lower kidney measuring 12 mm (series 4/image 22), with intrinsic T1 hyperintensity (series 13/image 45) but no convincing enhancement following contrast administration, noting motion degradation, benign (Bosniak II). Additional tiny hemorrhagic cysts are suspected (series 4/image 15; series 13/image 25) but poorly evaluated due to motion degradation.   No hydronephrosis.   Stomach/Bowel: Stomach is within normal limits.   Visualized bowel is unremarkable.   Vascular/Lymphatic:  No evidence of abdominal aortic aneurysm.   No suspicious abdominal lymphadenopathy.   Other:  No abdominal ascites.   Musculoskeletal: No focal osseous lesions.   IMPRESSION: Motion degraded images.   2.9 cm enhancing mass in the anterior left upper kidney, compatible with renal neoplasm such as renal cell carcinoma. This corresponds to the sonographic abnormality.   Additional bilateral renal cysts, as above, benign (Bosniak  I-II).     Electronically Signed   By: Charline Bills M.D.   On: 12/26/2022 03:37  This was personally reviewed and I agree with the radiologic interpretation.   EXAM: MRI ABDOMEN WITHOUT AND WITH CONTRAST   TECHNIQUE: Multiplanar multisequence MR imaging of the abdomen was performed both before and after the administration of intravenous contrast.   CONTRAST:  7.30mL GADAVIST GADOBUTROL 1 MMOL/ML IV SOLN   COMPARISON:  MRI abdomen from 12/22/2022.   FINDINGS: Lower chest: Unremarkable MR appearance to the lung bases. No pleural effusion. No pericardial effusion. Mild cardiomegaly.   Hepatobiliary: The liver is normal in size and configuration. No intrahepatic or extrahepatic bile duct dilatation. No choledocholithiasis. Unremarkable gallbladder.   Pancreas: No mass, inflammatory changes or other parenchymal abnormality identified. No main pancreatic duct dilation.   Spleen:  Within normal limits in size and appearance. No focal mass.   Adrenals/Urinary Tract: Unremarkable adrenal glands. No hydroureteronephrosis.   Redemonstration of multiple renal lesions, described as follows:   *There is a 2.8 x 3.2 cm heterogeneous, T2 hyperintense enhancing lesion, partially exophytically arising from the left kidney upper pole, anteriorly, favored to represent neoplastic process. There is slight interval increase in the size when compared to the prior exam. *There is well-circumscribed partially exophytic T1 hyperintense approximately 1.4 x 1.4 cm lesion arising from the left kidney lower pole, laterally, favored to represent a proteinaceous/hemorrhagic cyst. There multiple additional smaller proteinaceous/hemorrhagic cyst in the bilateral kidney upper poles (marked with electronic arrow sign on series 12). *There are multiple additional remaining T2 hyperintense lesions throughout bilateral kidneys, favored to represent simple renal cysts with largest partially exophytic  cyst arising from the right kidney interpolar region, laterally measuring up to 2.7 x 2.9 cm.   No new suspicious renal lesion seen.   Stomach/Bowel: There is a small sliding hiatal hernia. Visualized portions within the abdomen are unremarkable. No disproportionate dilation of bowel loops.   Vascular/Lymphatic: No pathologically enlarged lymph nodes identified. No abdominal aortic aneurysm demonstrated. No ascites.   Other:  None.   Musculoskeletal: No suspicious bone lesions identified.   IMPRESSION: *Mild interval increase in the patient's known pre-existing enhancing lesion arising from the left kidney upper pole, anteriorly measuring up to 2.8 x 3.2 cm on today's exam, 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. *Multiple other stable renal lesions, as described above. No new suspicious renal lesion seen. *Multiple other nonacute observations, as described above.  Electronically Signed   By: Jules Schick M.D.   On: 06/21/2023 14:22  This was personally reviewed and I agree with the radiologic interpretation.     Assessment & Plan:    1. Left renal mass - The mass is enlarging and likely renal cell carcinoma with 3 mm growth rate in just 76-month; still relatively small but suspicious for a more aggressive type tumors based on growth dynamics.  Intervention is complicated by comorbidities. - Unfortunately, due to its locations, I am suspicious that it will not be amenable to percutaneous intervention, but we will refer him to interventional radiology for their opinion - Nephrectomy is a sub-optimal treatment choice in light of his CKD. It would also be a challenging partial nephrectomy as he has multiple medical comorbidities.  - Would recommend consideration of a second opinion at [facility] or [facility] to see if they would recommend or consider any other treatment other than active surveillance-Mr. Alycia Rossetti would like to hold off on this for now.  He is rather see if percutaneous  intervention is an option and is significant less interested in surgery.  Will hold off on this referral for the time being but consider if ablation is not an option. - If no contact from interventional radiology within a few weeks, call the office for follow-up. -  Return for referral to interventional radiology .  I have reviewed the above documentation for accuracy and completeness, and I agree with the above.   [name], MD   [facility] [address] [place], [place] [address] [phone]  I spent 32 total minutes on the day of the encounter including pre-visit review of the medical record, face-to-face time with the patient, and post visit ordering of labs/imaging/tests.

## 2023-07-19 NOTE — Patient Instructions (Signed)
You will get a call from Interventional Radiology office.

## 2023-07-25 DIAGNOSIS — R809 Proteinuria, unspecified: Secondary | ICD-10-CM | POA: Diagnosis not present

## 2023-07-25 DIAGNOSIS — E785 Hyperlipidemia, unspecified: Secondary | ICD-10-CM | POA: Diagnosis not present

## 2023-07-25 DIAGNOSIS — E1122 Type 2 diabetes mellitus with diabetic chronic kidney disease: Secondary | ICD-10-CM | POA: Diagnosis not present

## 2023-07-25 DIAGNOSIS — N2889 Other specified disorders of kidney and ureter: Secondary | ICD-10-CM | POA: Diagnosis not present

## 2023-07-25 DIAGNOSIS — I10 Essential (primary) hypertension: Secondary | ICD-10-CM | POA: Diagnosis not present

## 2023-07-25 DIAGNOSIS — M1000 Idiopathic gout, unspecified site: Secondary | ICD-10-CM | POA: Diagnosis not present

## 2023-07-25 DIAGNOSIS — N400 Enlarged prostate without lower urinary tract symptoms: Secondary | ICD-10-CM | POA: Diagnosis not present

## 2023-07-25 DIAGNOSIS — I509 Heart failure, unspecified: Secondary | ICD-10-CM | POA: Diagnosis not present

## 2023-07-25 DIAGNOSIS — N1832 Chronic kidney disease, stage 3b: Secondary | ICD-10-CM | POA: Diagnosis not present

## 2023-08-01 ENCOUNTER — Ambulatory Visit: Payer: Medicare HMO

## 2023-08-01 ENCOUNTER — Other Ambulatory Visit: Payer: Medicare HMO

## 2023-08-01 ENCOUNTER — Ambulatory Visit: Payer: Medicare HMO | Admitting: Internal Medicine

## 2023-08-01 DIAGNOSIS — D631 Anemia in chronic kidney disease: Secondary | ICD-10-CM | POA: Diagnosis not present

## 2023-08-01 DIAGNOSIS — R809 Proteinuria, unspecified: Secondary | ICD-10-CM | POA: Diagnosis not present

## 2023-08-01 DIAGNOSIS — I509 Heart failure, unspecified: Secondary | ICD-10-CM | POA: Diagnosis not present

## 2023-08-01 DIAGNOSIS — N1832 Chronic kidney disease, stage 3b: Secondary | ICD-10-CM | POA: Diagnosis not present

## 2023-08-01 DIAGNOSIS — E1122 Type 2 diabetes mellitus with diabetic chronic kidney disease: Secondary | ICD-10-CM | POA: Diagnosis not present

## 2023-08-01 DIAGNOSIS — I10 Essential (primary) hypertension: Secondary | ICD-10-CM | POA: Diagnosis not present

## 2023-08-01 DIAGNOSIS — N400 Enlarged prostate without lower urinary tract symptoms: Secondary | ICD-10-CM | POA: Diagnosis not present

## 2023-08-01 DIAGNOSIS — E785 Hyperlipidemia, unspecified: Secondary | ICD-10-CM | POA: Diagnosis not present

## 2023-08-01 DIAGNOSIS — M1000 Idiopathic gout, unspecified site: Secondary | ICD-10-CM | POA: Diagnosis not present

## 2023-08-07 ENCOUNTER — Inpatient Hospital Stay: Payer: Medicare HMO | Attending: Internal Medicine

## 2023-08-07 ENCOUNTER — Inpatient Hospital Stay: Payer: Medicare HMO

## 2023-08-07 ENCOUNTER — Encounter: Payer: Self-pay | Admitting: Internal Medicine

## 2023-08-07 ENCOUNTER — Inpatient Hospital Stay: Payer: Medicare HMO | Admitting: Internal Medicine

## 2023-08-07 DIAGNOSIS — N2889 Other specified disorders of kidney and ureter: Secondary | ICD-10-CM | POA: Insufficient documentation

## 2023-08-07 DIAGNOSIS — D649 Anemia, unspecified: Secondary | ICD-10-CM | POA: Diagnosis not present

## 2023-08-07 DIAGNOSIS — N183 Chronic kidney disease, stage 3 unspecified: Secondary | ICD-10-CM | POA: Diagnosis not present

## 2023-08-07 DIAGNOSIS — I129 Hypertensive chronic kidney disease with stage 1 through stage 4 chronic kidney disease, or unspecified chronic kidney disease: Secondary | ICD-10-CM | POA: Diagnosis not present

## 2023-08-07 LAB — CBC WITH DIFFERENTIAL (CANCER CENTER ONLY)
Abs Immature Granulocytes: 0.03 10*3/uL (ref 0.00–0.07)
Basophils Absolute: 0.1 10*3/uL (ref 0.0–0.1)
Basophils Relative: 1 %
Eosinophils Absolute: 0.2 10*3/uL (ref 0.0–0.5)
Eosinophils Relative: 3 %
HCT: 34.4 % — ABNORMAL LOW (ref 39.0–52.0)
Hemoglobin: 10.5 g/dL — ABNORMAL LOW (ref 13.0–17.0)
Immature Granulocytes: 0 %
Lymphocytes Relative: 19 %
Lymphs Abs: 1.8 10*3/uL (ref 0.7–4.0)
MCH: 24.9 pg — ABNORMAL LOW (ref 26.0–34.0)
MCHC: 30.5 g/dL (ref 30.0–36.0)
MCV: 81.5 fL (ref 80.0–100.0)
Monocytes Absolute: 0.8 10*3/uL (ref 0.1–1.0)
Monocytes Relative: 9 %
Neutro Abs: 6.3 10*3/uL (ref 1.7–7.7)
Neutrophils Relative %: 68 %
Platelet Count: 371 10*3/uL (ref 150–400)
RBC: 4.22 MIL/uL (ref 4.22–5.81)
RDW: 17.2 % — ABNORMAL HIGH (ref 11.5–15.5)
WBC Count: 9.2 10*3/uL (ref 4.0–10.5)
nRBC: 0 % (ref 0.0–0.2)

## 2023-08-07 LAB — BASIC METABOLIC PANEL - CANCER CENTER ONLY
Anion gap: 12 (ref 5–15)
BUN: 38 mg/dL — ABNORMAL HIGH (ref 8–23)
CO2: 29 mmol/L (ref 22–32)
Calcium: 9.5 mg/dL (ref 8.9–10.3)
Chloride: 98 mmol/L (ref 98–111)
Creatinine: 2.31 mg/dL — ABNORMAL HIGH (ref 0.61–1.24)
GFR, Estimated: 29 mL/min — ABNORMAL LOW (ref >60)
Glucose, Bld: 96 mg/dL (ref 70–99)
Potassium: 3.1 mmol/L — ABNORMAL LOW (ref 3.5–5.1)
Sodium: 139 mmol/L (ref 135–145)

## 2023-08-07 LAB — IRON AND TIBC
Iron: 49 ug/dL (ref 45–182)
Saturation Ratios: 12 % — ABNORMAL LOW (ref 17.9–39.5)
TIBC: 409 ug/dL (ref 250–450)
UIBC: 360 ug/dL

## 2023-08-07 LAB — FERRITIN: Ferritin: 19 ng/mL — ABNORMAL LOW (ref 24–336)

## 2023-08-07 NOTE — Assessment & Plan Note (Addendum)
#   Chronic anemia-[AUG 2024-Hb 8.8; MCV- 76] microcytic recently getting worse.  Interestingly patient is mildly symptomatic. 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  Today hemoglobin is 10.5 improved from 9.5.  Discussed regarding iron infusions however patient wants to hold off until next visit.  I think is reasonable.  # ETIOLOGY:[facility] AUG 2023- [[facility]]- recommend EGD/ further work up- however patient declines at this time.   # CKD stage III [Dr.Korrapati]- M proetin; NEG; k/l= abnormal sec to CKD- stable.   # Incidental RCC left.  MRI 2024 Dr. Apolinar Junes- mild interval increase in the patient's known pre-existing enhancing lesion arising from the left kidney upper pole, anteriorlymeasuring up to 2.8 x 3.2 cm on today's exam, 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.  Awaiting evaluation with IR.  #HTN- stable- - And also follow-up with nephrology closely.  # DISPOSITION: #  NO venofer  # follow up in 4 months- MD; labs- cbc/bmp; iron studies; ferritin-possible venofer  Dr.B

## 2023-08-07 NOTE — Progress Notes (Signed)
Energy is a little better. Dyspnea with climbing stairs. Has some dizziness, had it yesterday. Appetite is good. No blood noted in stool.

## 2023-08-07 NOTE — Progress Notes (Signed)
[facility] CONSULT NOTE  Patient Care Team: Marisue Ivan, MD as PCP - General (Family Medicine) Earna Coder, MD as Consulting Physician (Oncology)  CHIEF COMPLAINTS/PURPOSE OF CONSULTATION: ANEMIA   HEMATOLOGY HISTORY  # ANEMIA[Hb; MCV-platelets- WBC; Iron sat; ferritin;  GFR- CT/US; EGD/colonoscopy-  # CKD- [Dr.]  HISTORY OF PRESENTING ILLNESS: Patient ambulating-independently.  Accompanied by his brother-in-law.  Eric Browning 72 y.o.  adult pleasant patient with CKD and symptomatic anemia-iron deficiency is here for follow-up/ and review the lab work up.   Patient currently on oral iron once a day.  Denies any constipation or abdominal discomfort.  States his energy is slightly improved.  Continues to have intermittent dizzy spells and also shortness of breath on exertion.    Appetite is good. No blood noted in stool.   Review of Systems  Constitutional:  Positive for malaise/fatigue. Negative for chills, diaphoresis, fever and weight loss.  HENT:  Negative for nosebleeds and sore throat.   Eyes:  Negative for double vision.  Respiratory:  Negative for cough, hemoptysis, sputum production, shortness of breath and wheezing.   Cardiovascular:  Negative for chest pain, palpitations, orthopnea and leg swelling.  Gastrointestinal:  Negative for abdominal pain, blood in stool, constipation, diarrhea, heartburn, melena, nausea and vomiting.  Genitourinary:  Negative for dysuria, frequency and urgency.  Musculoskeletal:  Negative for back pain and joint pain.  Skin: Negative.  Negative for itching and rash.  Neurological:  Negative for dizziness, tingling, focal weakness, weakness and headaches.  Endo/Heme/Allergies:  Does not bruise/bleed easily.  Psychiatric/Behavioral:  Negative for depression. The patient is not nervous/anxious and does not have insomnia.      MEDICAL HISTORY:  Past Medical History:  Diagnosis Date   Arthritis    hands    Diabetes (HCC)    Gout    Heart murmur    not treated for this   High cholesterol    Hypertension     SURGICAL HISTORY: Past Surgical History:  Procedure Laterality Date   CARDIAC CATHETERIZATION     COLONOSCOPY     COLONOSCOPY N/A 04/06/2022   Procedure: COLONOSCOPY;  Surgeon: Toledo, Boykin Nearing, MD;  Location: [facility] ENDOSCOPY;  Service: Gastroenterology;  Laterality: N/A;   COLONOSCOPY WITH PROPOFOL N/A 08/03/2015   Procedure: COLONOSCOPY WITH PROPOFOL;  Surgeon: Scot Jun, MD;  Location: [facility] ENDOSCOPY;  Service: Endoscopy;  Laterality: N/A;   CORONARY STENT INTERVENTION N/A 12/31/2020   Procedure: CORONARY STENT INTERVENTION;  Surgeon: Alwyn Pea, MD;  Location: [facility] INVASIVE CV LAB;  Service: Cardiovascular;  Laterality: N/A;  RCA   EUS N/A 09/03/2015   Procedure: LOWER ENDOSCOPIC ULTRASOUND (EUS);  Surgeon: Bearl Mulberry, MD;  Location: [facility] ENDOSCOPY;  Service: Endoscopy;  Laterality: N/A;   EYE SURGERY Right 2013   cataract extraction with iol implant   JOINT REPLACEMENT Right 2017   dr. Joice Lofts   LEFT HEART CATH AND CORONARY ANGIOGRAPHY N/A 12/31/2020   Procedure: LEFT HEART CATH AND CORONARY ANGIOGRAPHY;  Surgeon: Alwyn Pea, MD;  Location: [facility] INVASIVE CV LAB;  Service: Cardiovascular;  Laterality: N/A;   REVERSE SHOULDER ARTHROPLASTY Right 12/12/2017   Procedure: REVERSE SHOULDER ARTHROPLASTY;  Surgeon: Christena Flake, MD;  Location: [facility] ORS;  Service: Orthopedics;  Laterality: Right;   right eye lens implant Right    TONSILLECTOMY     TOTAL KNEE ARTHROPLASTY Right 05/12/2016   Procedure: TOTAL KNEE ARTHROPLASTY;  Surgeon: Christena Flake, MD;  Location: [facility] ORS;  Service: Orthopedics;  Laterality: Right;    SOCIAL HISTORY: Social History   Socioeconomic History   Marital status: Married    Spouse name: Not on file   Number of children: Not on file   Years of education: Not on file   Highest education level: Not on file  Occupational  History   Occupation: works in cigarette factory  Tobacco Use   Smoking status: Former    Current packs/day: 0.00    Average packs/day: 1 pack/day for 30.0 years (30.0 ttl pk-yrs)    Types: Cigarettes    Start date: 09/28/1958    Quit date: 09/28/1988    Years since quitting: 34.8   Smokeless tobacco: Never   Tobacco comments:    quit 1990  Vaping Use   Vaping status: Never Used  Substance and Sexual Activity   Alcohol use: No   Drug use: No   Sexual activity: Yes  Other Topics Concern   Not on file  Social History Narrative   Not on file   Social Determinants of Health   Financial Resource Strain: Medium Risk (03/29/2023)   Received from [facility]   Overall Financial Resource Strain (CARDIA)    Difficulty of Paying Living Expenses: Somewhat hard  Food Insecurity: No Food Insecurity (03/29/2023)   [facility] from [facility]   Hunger Vital Sign    Worried About Running Out of Food in the Last Year: Never true    Ran Out of Food in the Last Year: Never true  Transportation Needs: No Transportation Needs (03/29/2023)   Received from [facility]    In the past 12 months, has lack of transportation kept you from medical appointments or from getting medications?: No    Lack of Transportation (Non-Medical): No  Physical Activity: Not on file  Stress: Not on file  Social Connections: Not on file  Intimate Partner Violence: Not on file    FAMILY HISTORY: Family History  Problem Relation Age of Onset   Heart disease Mother    Stroke Father    Hypertension Father    Gout Father    Cancer Brother    Bone cancer Son    Brain cancer Son     ALLERGIES:  has No Known Allergies.  MEDICATIONS:  Current Outpatient Medications  Medication Sig Dispense Refill   allopurinol (ZYLOPRIM) 300 MG tablet Take 300 mg by mouth daily.     amLODipine (NORVASC) 10 MG tablet Take 1 tablet (10 mg total) by mouth  daily. 30 tablet 6   aspirin EC 81 MG tablet Take 81 mg by mouth daily. Swallow whole.     clopidogrel (PLAVIX) 75 MG tablet Take 75 mg by mouth daily.     colchicine 0.6 MG tablet Take 0.6 mg by mouth daily as needed. For gout flare-ups     furosemide (LASIX) 20 MG tablet Take 20 mg by mouth daily.     hydrALAZINE (APRESOLINE) 25 MG tablet Take 1 tablet (25 mg total) by mouth 3 (three) times daily. 90 tablet 6   hydrochlorothiazide (HYDRODIURIL) 25 MG tablet Take 1 tablet (25 mg total) by mouth daily. 30 tablet 6   metFORMIN (GLUCOPHAGE) 500 MG tablet Take 500 mg by mouth 2 (two) times daily.      metoprolol tartrate (LOPRESSOR) 25 MG tablet Take 25 mg by mouth 2 (two) times daily.     olmesartan (BENICAR) 40 MG tablet Take 40 mg by mouth daily.  potassium chloride SA (K-DUR,KLOR-CON) 20 MEQ tablet Take 20 mEq by mouth 3 (three) times daily.      pravastatin (PRAVACHOL) 20 MG tablet Take 20 mg by mouth daily.     rosuvastatin (CRESTOR) 20 MG tablet Take 1 tablet (20 mg total) by mouth daily. 60 tablet 6   tamsulosin (FLOMAX) 0.4 MG CAPS capsule Take 0.4 mg by mouth daily.     ticagrelor (BRILINTA) 90 MG TABS tablet Take 1 tablet (90 mg total) by mouth 2 (two) times daily. 60 tablet 6   No current facility-administered medications for this visit.     PHYSICAL EXAMINATION:   Vitals:   08/07/23 1328  BP: 133/63  Pulse: 68  Resp: 16  Temp: (!) 97.1 F (36.2 C)  SpO2: 100%   Filed Weights   08/07/23 1328  Weight: 204 lb 9.6 oz (92.8 kg)    Physical Exam Vitals and nursing note reviewed.  HENT:     Head: Normocephalic and atraumatic.     Mouth/Throat:     Pharynx: Oropharynx is clear.  Eyes:     Extraocular Movements: Extraocular movements intact.     Pupils: Pupils are equal, round, and reactive to light.  Cardiovascular:     Rate and Rhythm: Normal rate and regular rhythm.     Heart sounds: Murmur heard.  Pulmonary:     Comments: Decreased breath sounds bilaterally.   Abdominal:     Palpations: Abdomen is soft.  Musculoskeletal:        General: Normal range of motion.     Cervical back: Normal range of motion.  Skin:    General: Skin is warm.  Neurological:     General: No focal deficit present.     Mental Status: He is alert and oriented to person, place, and time.  Psychiatric:        Behavior: Behavior normal.        Judgment: Judgment normal.      LABORATORY DATA:  I have reviewed the data as listed Lab Results  Component Value Date   WBC 9.2 08/07/2023   HGB 10.5 (L) 08/07/2023   HCT 34.4 (L) 08/07/2023   MCV 81.5 08/07/2023   PLT 371 08/07/2023   Recent Labs    08/07/23 1307  NA 139  K 3.1*  CL 98  CO2 29  GLUCOSE 96  BUN 38*  CREATININE 2.31*  CALCIUM 9.5  GFRNONAA 29*     No results found.  ASSESSMENT & PLAN:   Symptomatic anemia # Chronic anemia-[AUG 2024-Hb 8.8; MCV- 76] microcytic recently getting worse.  Interestingly patient is mildly symptomatic. 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  Today hemoglobin is 10.5 improved from 9.5.  Discussed regarding iron infusions however patient wants to hold off until next visit.  I think is reasonable.  # ETIOLOGY:[facility] AUG 2023- [[facility]]- recommend EGD/ further work up- however patient declines at this time.   # CKD stage III [Dr.Korrapati]- M proetin; NEG; k/l= abnormal sec to CKD- stable.   # Incidental RCC left.  MRI 2024 Dr. Apolinar Junes- mild interval increase in the patient's known pre-existing enhancing lesion arising from the left kidney upper pole, anteriorlymeasuring up to 2.8 x 3.2 cm on today's exam, 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.  Awaiting evaluation with IR.  #HTN- stable- - And also follow-up with nephrology closely.  # DISPOSITION: #  NO venofer  # follow up in 4 months-  MD; labs- cbc/bmp; iron studies; ferritin-possible venofer  Dr.B  All questions were answered. The  patient knows to call the clinic with any problems, questions or concerns.    Earna Coder, MD 08/07/2023 2:01 PM

## 2023-08-08 ENCOUNTER — Telehealth: Payer: Self-pay | Admitting: Urology

## 2023-08-08 ENCOUNTER — Other Ambulatory Visit: Payer: Self-pay | Admitting: Urology

## 2023-08-08 DIAGNOSIS — N2889 Other specified disorders of kidney and ureter: Secondary | ICD-10-CM

## 2023-08-08 NOTE — Telephone Encounter (Signed)
Sending message to [facility] about this, not sure if it was communicated at the time of the visit to [facility] to work on referral to IR but working on this now.

## 2023-08-08 NOTE — Telephone Encounter (Signed)
Pt called asking about referral to IR.  He said no one has contacted him yet.  He asked for their phone number.  Can you look into this please?

## 2023-08-17 DIAGNOSIS — E782 Mixed hyperlipidemia: Secondary | ICD-10-CM | POA: Diagnosis not present

## 2023-08-17 DIAGNOSIS — R0602 Shortness of breath: Secondary | ICD-10-CM | POA: Diagnosis not present

## 2023-08-17 DIAGNOSIS — R42 Dizziness and giddiness: Secondary | ICD-10-CM | POA: Diagnosis not present

## 2023-08-17 DIAGNOSIS — G4733 Obstructive sleep apnea (adult) (pediatric): Secondary | ICD-10-CM | POA: Diagnosis not present

## 2023-08-17 DIAGNOSIS — I2510 Atherosclerotic heart disease of native coronary artery without angina pectoris: Secondary | ICD-10-CM | POA: Diagnosis not present

## 2023-08-17 DIAGNOSIS — Z955 Presence of coronary angioplasty implant and graft: Secondary | ICD-10-CM | POA: Diagnosis not present

## 2023-08-17 DIAGNOSIS — E119 Type 2 diabetes mellitus without complications: Secondary | ICD-10-CM | POA: Diagnosis not present

## 2023-08-17 DIAGNOSIS — I10 Essential (primary) hypertension: Secondary | ICD-10-CM | POA: Diagnosis not present

## 2023-08-17 DIAGNOSIS — I5022 Chronic systolic (congestive) heart failure: Secondary | ICD-10-CM | POA: Diagnosis not present

## 2023-08-24 ENCOUNTER — Ambulatory Visit
Admission: RE | Admit: 2023-08-24 | Discharge: 2023-08-24 | Disposition: A | Discharge status: Home / Self Care | Payer: Medicare HMO | Source: Ambulatory Visit | Attending: Urology | Admitting: Urology

## 2023-08-24 DIAGNOSIS — N2889 Other specified disorders of kidney and ureter: Secondary | ICD-10-CM | POA: Diagnosis not present

## 2023-08-24 HISTORY — PX: IR RADIOLOGIST EVAL & MGMT: IMG5224

## 2023-08-24 NOTE — Consult Note (Signed)
Chief Complaint: Patient was seen in consultation today for left renal neoplasm at the request of Brandon,Ashley  Referring Physician(s): Brandon,Ashley  History of Present Illness: Eric Browning is a 72 y.o. adult Male with a history of chronic kidney disease and a left-sided renal neoplasm.  His renal neoplasm was first diagnosed in April 2024 by MRI at which time it measured 2.9 x 2.6 x 2.4 cm.  Follow-up MRI performed 6 months later on 06/14/2023 demonstrates interval enlargement to 3.2 cm.  He is a poor surgical candidate due to the location of the lesion which is anterior from the upper pole of the left kidney near the tail of the pancreas as well as his underlying chronic kidney disease.  His creatinine was most recently 2.3.  He denies flank pain, hematuria or other clinical symptoms.  Past Medical History:  Diagnosis Date   Arthritis    hands   Diabetes (HCC)    Gout    Heart murmur    not treated for this   High cholesterol    Hypertension     Past Surgical History:  Procedure Laterality Date   CARDIAC CATHETERIZATION     COLONOSCOPY     COLONOSCOPY N/A 04/06/2022   Procedure: COLONOSCOPY;  Surgeon: Toledo, Boykin Nearing, MD;  Location: [facility] ENDOSCOPY;  Service: Gastroenterology;  Laterality: N/A;   COLONOSCOPY WITH PROPOFOL N/A 08/03/2015   Procedure: COLONOSCOPY WITH PROPOFOL;  Surgeon: Scot Jun, MD;  Location[facility] ENDOSCOPY;  Service: Endoscopy;  Laterality: N/A;   CORONARY STENT INTERVENTION N/A 12/31/2020   Procedure: CORONARY STENT INTERVENTION;  Surgeon: Alwyn Pea, MD;  Location: [facility] INVASIVE CV LAB;  Service: Cardiovascular;  Laterality: N/A;  RCA   EUS N/A 09/03/2015   Procedure: LOWER ENDOSCOPIC ULTRASOUND (EUS);  Surgeon: Bearl Mulberry, MD;  Location: [facility] ENDOSCOPY;  Service: Endoscopy;  Laterality: N/A;   EYE SURGERY Right 2013   cataract extraction with iol implant   IR RADIOLOGIST EVAL & MGMT  08/24/2023   JOINT REPLACEMENT  Right 2017   dr. Joice Lofts   LEFT HEART CATH AND CORONARY ANGIOGRAPHY N/A 12/31/2020   Procedure: LEFT HEART CATH AND CORONARY ANGIOGRAPHY;  Surgeon: Alwyn Pea, MD;  Location: [facility] INVASIVE CV LAB;  Service: Cardiovascular;  Laterality: N/A;   REVERSE SHOULDER ARTHROPLASTY Right 12/12/2017   Procedure: REVERSE SHOULDER ARTHROPLASTY;  Surgeon: Christena Flake, MD;  Location: [facility] ORS;  Service: Orthopedics;  Laterality: Right;   right eye lens implant Right    TONSILLECTOMY     TOTAL KNEE ARTHROPLASTY Right 05/12/2016   Procedure: TOTAL KNEE ARTHROPLASTY;  Surgeon: Christena Flake, MD;  Location: [facility] ORS;  Service: Orthopedics;  Laterality: Right;    Allergies: Patient has no known allergies.  Medications: Prior to Admission medications   Medication Sig Start Date End Date Taking? Authorizing Provider  allopurinol (ZYLOPRIM) 300 MG tablet Take 300 mg by mouth daily.    [provider]  amLODipine (NORVASC) 10 MG tablet Take 1 tablet (10 mg total) by mouth daily. 01/02/21   Callwood, Gerda Diss D, MD  aspirin EC 81 MG tablet Take 81 mg by mouth daily. Swallow whole.    [provider]  clopidogrel (PLAVIX) 75 MG tablet Take 75 mg by mouth daily. 05/03/23   [provider]  colchicine 0.6 MG tablet Take 0.6 mg by mouth daily as needed. For gout flare-ups    [provider]  furosemide (LASIX) 20 MG tablet Take 20 mg by mouth daily.  07/03/23   [provider]  hydrALAZINE (APRESOLINE) 25 MG tablet Take 1 tablet (25 mg total) by mouth 3 (three) times daily. 01/01/21   Callwood, Dwayne D, MD  hydrochlorothiazide (HYDRODIURIL) 25 MG tablet Take 1 tablet (25 mg total) by mouth daily. 01/02/21   Callwood, Dwayne D, MD  metFORMIN (GLUCOPHAGE) 500 MG tablet Take 500 mg by mouth 2 (two) times daily.     [provider]  metoprolol tartrate (LOPRESSOR) 25 MG tablet Take 25 mg by mouth 2 (two) times daily.    [provider]  olmesartan (BENICAR) 40 MG  tablet Take 40 mg by mouth daily. 12/07/20   [provider]  potassium chloride SA (K-DUR,KLOR-CON) 20 MEQ tablet Take 20 mEq by mouth 3 (three) times daily.     [provider]  pravastatin (PRAVACHOL) 20 MG tablet Take 20 mg by mouth daily. 05/25/23   [provider]  rosuvastatin (CRESTOR) 20 MG tablet Take 1 tablet (20 mg total) by mouth daily. 01/01/21   Callwood, Dwayne D, MD  tamsulosin (FLOMAX) 0.4 MG CAPS capsule Take 0.4 mg by mouth daily. 09/17/20   [provider]  ticagrelor (BRILINTA) 90 MG TABS tablet Take 1 tablet (90 mg total) by mouth 2 (two) times daily. 01/01/21   Callwood, Bobbie Stack, MD     Family History  Problem Relation Age of Onset   Heart disease Mother    Stroke Father    Hypertension Father    Gout Father    Cancer Brother    Bone cancer Son    Brain cancer Son     Social History   Socioeconomic History   Marital status: Married    Spouse name: Not on file   Number of children: Not on file   Years of education: Not on file   Highest education level: Not on file  Occupational History   Occupation: works in cigarette factory  Tobacco Use   Smoking status: Former    Current packs/day: 0.00    Average packs/day: 1 pack/day for 30.0 years (30.0 ttl pk-yrs)    Types: Cigarettes    Start date: 09/28/1958    Quit date: 09/28/1988    Years since quitting: 34.9   Smokeless tobacco: Never   Tobacco comments:    quit 1990  Vaping Use   Vaping status: Never Used  Substance and Sexual Activity   Alcohol use: No   Drug use: No   Sexual activity: Yes  Other Topics Concern   Not on file  Social History Narrative   Not on file   Social Drivers of Health   Financial Resource Strain: Medium Risk (03/29/2023)   Received [facility]   Overall Financial Resource Strain (CARDIA)    Difficulty of Paying Living Expenses: Somewhat hard  Food Insecurity: No Food Insecurity (03/29/2023)   Received from [facility]   Hunger Vital Sign    Worried About Running Out of Food in the Last Year: Never true    Ran Out of Food in the Last Year: Never true  Transportation Needs: No Transportation Needs (03/29/2023)   Received from [facility] - Transportation    In the past 12 months, has lack of transportation kept you from medical appointments or from getting medications?: No    Lack of Transportation (Non-Medical): No  Physical Activity: Not on file  Stress: Not on file  Social Connections: Not on file  ECOG Status: 0 - Asymptomatic  Review of Systems: A 12 point ROS discussed and pertinent positives are indicated in the HPI above.  All other systems are negative.  Review of Systems  Vital Signs: BP (!) 151/77 (BP Location: Left Arm, Patient Position: Sitting, Cuff Size: Normal)   Pulse 97   Temp 97.8 F (36.6 C) (Oral)   Resp 14   SpO2 97%   Advance Care Plan: The advanced care plan/surrogate decision maker was discussed at the time of visit and the patient did not wish to discuss or was not able to name a surrogate decision maker or provide an advance care plan.    Physical Exam Constitutional:      Appearance: Normal appearance.  HENT:     Head: Normocephalic and atraumatic.  Eyes:     General: No scleral icterus. Cardiovascular:     Rate and Rhythm: Normal rate.  Pulmonary:     Effort: Pulmonary effort is normal.  Abdominal:     General: There is no distension.     Palpations: Abdomen is soft.     Tenderness: There is no abdominal tenderness.  Skin:    General: Skin is warm and dry.  Neurological:     General: No focal deficit present.     Mental Status: He is alert and oriented to person, place, and time.  Psychiatric:        Mood and Affect: Mood normal.        Behavior: Behavior normal.       Imaging: IR Radiologist Eval & Mgmt Result Date: 08/24/2023 EXAM: NEW PATIENT OFFICE VISIT CHIEF COMPLAINT: SEE NOTE IN  EPIC HISTORY OF PRESENT ILLNESS: SEE NOTE IN EPIC REVIEW OF SYSTEMS: SEE NOTE IN EPIC PHYSICAL EXAMINATION: SEE NOTE IN EPIC ASSESSMENT AND PLAN: SEE NOTE IN EPIC Electronically Signed   By: Malachy Moan M.D.   On: 08/24/2023 13:59    Labs:  CBC: Recent Labs    05/09/23 1158 08/07/23 1307  WBC 9.3 9.2  HGB 9.5* 10.5*  HCT 31.3* 34.4*  PLT 420* 371    COAGS: No results for input(s): "INR", "APTT" in the last 8760 hours.  BMP: Recent Labs    08/07/23 1307  NA 139  K 3.1*  CL 98  CO2 29  GLUCOSE 96  BUN 38*  CALCIUM 9.5  CREATININE 2.31*  GFRNONAA 29*    LIVER FUNCTION TESTS: No results for input(s): "BILITOT", "AST", "ALT", "ALKPHOS", "PROT", "ALBUMIN" in the last 8760 hours.  TUMOR MARKERS: No results for input(s): "AFPTM", "CEA", "CA199", "CHROMGRNA" in the last 8760 hours.  Assessment and Plan:  Extremely pleasant 72 year old gentleman with an enlarging 3.2 cm avidly enhancing lesion arising from the anterior upper pole of the left kidney highly concerning for renal cell carcinoma.  We discussed the natural history of enhancing solid renal neoplasms at length as well as the fact that 80% of such lesions represent renal cell carcinoma.  Biopsy is of limited utility given the high false negative rate.  He understands that pursuing treatment is the standard of care.  Unfortunately, he is not an optimal surgical candidate given the challenging location of the lesion.  Fortunately, he is an excellent candidate for percutaneous thermal ablation.  We discussed the risks, benefits and alternatives to percutaneous microwave ablation at length.  He understands and desires to proceed.  1.)  Please schedule for CT-guided biopsy and concurrent CT-guided microwave ablation of left sided renal mass to be performed at [facility]  hospital under general anesthesia.  Thank you for this interesting consult.  I greatly enjoyed meeting Eric Browning and look forward to  participating in their care.  A copy of this report was sent to the requesting provider on this date.  Electronically Signed: Sterling Big 08/24/2023, 3:16 PM   I spent a total of  60 Minutes  in face to face in clinical consultation, greater than 50% of which was counseling/coordinating care for left renal neoplasm

## 2023-08-31 ENCOUNTER — Other Ambulatory Visit: Payer: Self-pay | Admitting: Interventional Radiology

## 2023-08-31 ENCOUNTER — Encounter: Payer: Self-pay | Admitting: Internal Medicine

## 2023-08-31 DIAGNOSIS — N2889 Other specified disorders of kidney and ureter: Secondary | ICD-10-CM

## 2023-09-06 ENCOUNTER — Inpatient Hospital Stay: Admission: RE | Admit: 2023-09-06 | Payer: Medicare Other | Source: Ambulatory Visit

## 2023-09-07 ENCOUNTER — Inpatient Hospital Stay
Admission: RE | Admit: 2023-09-07 | Discharge: 2023-09-07 | Disposition: A | Discharge status: Home / Self Care | Payer: Medicare Other | Source: Ambulatory Visit

## 2023-09-07 ENCOUNTER — Encounter: Payer: Self-pay | Admitting: Urgent Care

## 2023-09-07 ENCOUNTER — Encounter
Admission: RE | Admit: 2023-09-07 | Discharge: 2023-09-07 | Disposition: A | Discharge status: Home / Self Care | Payer: Medicare Other | Source: Ambulatory Visit | Attending: Interventional Radiology | Admitting: Interventional Radiology

## 2023-09-07 DIAGNOSIS — I10 Essential (primary) hypertension: Secondary | ICD-10-CM | POA: Insufficient documentation

## 2023-09-07 DIAGNOSIS — R079 Chest pain, unspecified: Secondary | ICD-10-CM

## 2023-09-07 DIAGNOSIS — Z01810 Encounter for preprocedural cardiovascular examination: Secondary | ICD-10-CM | POA: Insufficient documentation

## 2023-09-07 HISTORY — DX: Gastro-esophageal reflux disease without esophagitis: K21.9

## 2023-09-07 HISTORY — DX: Iron deficiency anemia, unspecified: D50.9

## 2023-09-07 HISTORY — DX: Benign prostatic hyperplasia without lower urinary tract symptoms: N40.0

## 2023-09-07 HISTORY — DX: Sleep apnea, unspecified: G47.30

## 2023-09-07 HISTORY — DX: Chronic kidney disease, unspecified: N18.9

## 2023-09-07 NOTE — Patient Instructions (Addendum)
 Your procedure is scheduled on: Monday, Jan 13/2025  Report to the Registration Desk on the 1st floor of the [facility]. To find out your arrival time, please call [phone] between 1PM - 3PM on: Jan 05/2024  If your arrival time is 6:00 am, do not arrive before that time as the [facility] Mall entrance doors do not open until 6:00 am.  REMEMBER: Instructions that are not followed completely may result in serious medical risk, up to and including death; or upon the discretion of your surgeon and anesthesiologist your surgery may need to be rescheduled.  Do not eat food after midnight the night before surgery.  No gum chewing or hard candies.   One week prior to surgery: Stop Anti-inflammatories (NSAIDS) such as Advil , Aleve, Ibuprofen , Motrin , Naproxen, Naprosyn and Aspirin  based products such as Excedrin, Goody's Powder, BC Powder. Stop ANY OVER THE COUNTER supplements until after surgery.  You may however, continue to take Tylenol  if needed for pain up until the day of surgery.  **Follow guidelines for insulin  and diabetes medications.**                        Hold metformin  two days prior to surgery.  Last dose is Jan 10,2025                                        **Follow recommendations regarding stopping blood thinners.**                                                       Per patient he stopped plavix- 08/18/2023                                                                    To stop aspirin  5 days priro to procedure. Last dose should have been Tuesday, Sep 06, 2023                                              Per patient  he stopped aspirin  09/04/2023 already            Continue taking all of your other prescription medications up until the day of surgery.  ON THE DAY OF SURGERY ONLY TAKE THESE MEDICATIONS WITH SIPS OF WATER :  allopurinol  (ZYLOPRIM )  amLODipine  (NORVASC ) hydrALAZINE  (APRESOLINE ) metoprolol  tartrate 5. potassium chloride   6. rosuvastatin   (CRESTOR )  7. tamsulosin (FLOMAX)    Use inhalers on the day of surgery as prescribed   No Alcohol for 24 hours before or after surgery.  No Smoking including e-cigarettes for 24 hours before surgery.  No chewable tobacco products for at least 6 hours before surgery.  No nicotine patches on the day of surgery.  Do not use any recreational drugs for at least a week (preferably 2 weeks) before your surgery.  Please be advised that the combination  of cocaine and anesthesia may have negative outcomes, up to and including death. If you test positive for cocaine, your surgery will be cancelled.  On the morning of surgery brush your teeth with toothpaste and water , you may rinse your mouth with mouthwash if you wish. Do not swallow any toothpaste or mouthwash.  Use CHG Soap as directed on instruction sheet.---provided for you  Do not wear jewelry, make-up, hairpins, clips or nail polish.  For welded (permanent) jewelry: bracelets, anklets, waist bands, etc.  Please have this removed prior to surgery.  If it is not removed, there is a chance that hospital personnel will need to cut it off on the day of surgery.  Do not wear lotions, powders, or perfumes.   Do not shave body hair from the neck down 48 hours before surgery.  Contact lenses, hearing aids and dentures may not be worn into surgery.  Do not bring valuables to the hospital. [facility] is not responsible for any missing/lost belongings or valuables.    Bring your C-PAP to the hospital in case you may have to spend the night.   Notify your doctor if there is any change in your medical condition (cold, fever, infection).  Wear comfortable clothing (specific to your surgery type) to the hospital.  After surgery, you can help prevent lung complications by doing breathing exercises.  Take deep breaths and cough every 1-2 hours. Your doctor may order a device called an Incentive Spirometer to help you take deep breaths.   If  you are being discharged the day of surgery, you will not be allowed to drive home. You will need a responsible individual to drive you home and stay with you for 24 hours after surgery.    Please call the Pre-admissions Testing Dept. at [phone] if you have any questions about these instructions.  Surgery Visitation Policy:  Patients having surgery or a procedure may have two visitors.  Children under the age of 14 must have an adult with them who is not the patient.        Preparing for Surgery with CHLORHEXIDINE  GLUCONATE (CHG) Soap  Chlorhexidine  Gluconate (CHG) Soap  o An antiseptic cleaner that kills germs and bonds with the skin to continue killing germs even after washing  o Used for showering the night before surgery and morning of surgery  Before surgery, you can play an important role by reducing the number of germs on your skin.  CHG (Chlorhexidine  gluconate) soap is an antiseptic cleanser which kills germs and bonds with the skin to continue killing germs even after washing.  Please do not use if you have an allergy to CHG or antibacterial soaps. If your skin becomes reddened/irritated stop using the CHG.  1. Shower the NIGHT BEFORE SURGERY and the MORNING OF SURGERY with CHG soap.  2. If you choose to wash your hair, wash your hair first as usual with your normal shampoo.  3. After shampooing, rinse your hair and body thoroughly to remove the shampoo.  4. Use CHG as you would any other liquid soap. You can apply CHG directly to the skin and wash gently with a scrungie or a clean washcloth.  5. Apply the CHG soap to your body only from the neck down. Do not use on open wounds or open sores. Avoid contact with your eyes, ears, mouth, and genitals (private parts). Wash face and genitals (private parts) with your normal soap.  6. Wash thoroughly, paying special attention to the area  where your surgery will be performed.  7. Thoroughly rinse your body with warm  water .  8. Do not shower/wash with your normal soap after using and rinsing off the CHG soap.  9. Pat yourself dry with a clean towel.  10. Wear clean pajamas to bed the night before surgery.  12. Place clean sheets on your bed the night of your first shower and do not sleep with pets.  13. Shower again with the CHG soap on the day of surgery prior to arriving at the hospital.  14. Do not apply any deodorants/lotions/powders.  15. Please wear clean clothes to the hospital.

## 2023-09-07 NOTE — Pre-Procedure Instructions (Signed)
 Pre-anesthesia interview completed and instructions given. Patient verbalized understanding. In addition, per patient he stopped his aspirin Jan 01/2024 and  stopped Plavix 08/18/2023 .

## 2023-09-08 ENCOUNTER — Other Ambulatory Visit (HOSPITAL_COMMUNITY): Payer: Self-pay | Admitting: Student

## 2023-09-08 DIAGNOSIS — N2889 Other specified disorders of kidney and ureter: Secondary | ICD-10-CM

## 2023-09-08 NOTE — Progress Notes (Signed)
 Patient for CT guided LT renal mass MWA on Mon 09/11/23, I called and spoke with the patient on the phone and gave pre-procedure instructions. Pt was made aware to be here at 7:30a, last dose of ASA 81mg  was on Tues 09/05/2023,  NPO after MN prior to procedure as well as driver post procedure/recovery/discharge. Pt stated understanding.  Called 08/31/2023, 09/05/2023, and 09/08/2023  Plavix was discontinued in 07/2023 by Dr JONETTA Lovelace, MD per Powell his RN at [facility]. 09/04/2023

## 2023-09-08 NOTE — H&P (Signed)
 Chief Complaint: Left renal mass. Request is for left sided renal mass biopsy with concurrent thermal ablation  Referring Physician(s): Rosina Riis   Supervising Physician: Karalee Beat  Patient Status: [facility] - Out-pt  History of Present Illness: Eric Browning is a 73 y.o. adult outpatient. History of DM, left vertebral artery stenosis, HTN, HLD, CHD, GERD, CVA, CKD, CAD. Found to have an enlarging left sided renal mass in April 2024. 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 *Multiple other stable renal lesions, as described above. No new suspicious renal lesion seen. Deemed not to be a surgical candidate due to co-morbilities and location of the mass. The patient was seen for consultation in the Interventional Radiology Clinic  on 12.26.24 with IR Attending Dr. Beat Karalee. At that time a detailed discussion regarding the Patient's medical condition including but not limited to possible treatment options took place. Following that discussion the Patient  elected to proceed with left sided renal mass biopsy and concurrent thermal ablation. . The Patient presents today for left sided renal mass biopsy with concurrent thermal ablation  Currently without any significant complaints. Patient alert and laying in bed,calm. Denies any fevers, headache, chest pain, SOB, cough, abdominal pain, nausea, vomiting or bleeding.   Labs pending. Last dose of ASA on 1.7.24. All other medications are within acceptable parameters. NKDA. Patient has been NPO since midnight.    Return precautions and treatment recommendations and follow-up discussed with the patient  who is agreeable with the plan.    Past Medical History:  Diagnosis Date   Angina at rest [facility])    Arthritis of both hands     Bilateral cataracts    Bilateral renal cysts    BPH (benign prostatic hyperplasia)    CAD (coronary artery disease)    a.) LHC/PCI 12/31/2020: 90% dRCA (3.0 x 15 mm Resolute Onyx DES); 50% pRCA, minor lum irregs in LAD and LCx   Cardiomegaly    Cardiomyopathy (HCC)    a.) TTE 07/03/2015: EF 50%; b.) MV 07/03/2015: EF 53%; c.) TTE 01/08/2019: EF 45%; d.) MV 01/08/2019: EF 45-50%; e.) TTE 11/26/2020: EF 45%; f.) MV 11/26/2020: EF 51%; g.) TTE 03/06/2023: EF 40%   CKD (chronic kidney disease), stage III (HCC)    CVA (cerebral vascular accident) (HCC)    a.) noted on CT head 03/12/2017: RIGHT posterior  parietal encephalomalacia consistent with small chronic temporoparietal infarct   DDD (degenerative disc disease), cervical    Dyspnea on exertion    GERD (gastroesophageal reflux disease)    Gout    Heart murmur    HFrEF (heart failure with reduced ejection fraction) (HCC)    a.) TTE 07/03/2015: EF 50%, mild-mod LVH, inf/post HK, G1DD, triv MR/PR, mild AR/TR, RVSP 36.8; b.) TTE 01/08/2019: EF 45%, sep/apical HK, mild LVH, mild LAE, mild panval regurg, RVSP 37.2; d.) TTE 11/26/2020: EF 45%, apical.inf/post HK, G1DD, mild LAE/LVE, triv AR/PR, mild MR/TR, RVSP 52.9; e.)  TTE 03/06/2023: EF 40%, sep/apical/inf HK, sev LAE, mild AR/TR/PR, mod MR, RVSP 35.3   Hiatal hernia    High cholesterol    Hypertension    IDA (iron deficiency anemia)    Left renal mass 12/13/2022   a.) US  renal 12/13/2022: 2.7 x 2.4 x 2.5 cm; b.) MRI abdomen 12/22/2022: 2.9 x 2.6 x 2.4 cm LEFT  anterior upper kidney; c.) MRI abdomen 06/14/2023: interval increase in size to 2.8 x 3.2 cm --> 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   Long term current use of aspirin     OSA on CPAP    Primary hypogonadism in male    Stenosis of left vertebral artery (severe)    T2DM (type 2 diabetes mellitus) (HCC)     Past Surgical History:  Procedure Laterality Date   CATARACT EXTRACTION W/ INTRAOCULAR LENS IMPLANT Right 2013    COLONOSCOPY     COLONOSCOPY N/A 04/06/2022   Procedure: COLONOSCOPY;  Surgeon: Toledo, Ladell POUR, MD;  Location: [facility] ENDOSCOPY;  Service: Gastroenterology;  Laterality: N/A;   COLONOSCOPY WITH PROPOFOL  N/A 08/03/2015   Procedure: COLONOSCOPY WITH PROPOFOL ;  Surgeon: Lamar ONEIDA Holmes, MD;  Location: [facility] ENDOSCOPY;  Service: Endoscopy;  Laterality: N/A;   CORONARY STENT INTERVENTION N/A 12/31/2020   Procedure: CORONARY STENT INTERVENTION;  Surgeon: Florencio Cara BIRCH, MD;  Location: [facility] INVASIVE CV LAB;  Service: Cardiovascular;  Laterality: N/A;  RCA   EUS N/A 09/03/2015   Procedure: LOWER ENDOSCOPIC ULTRASOUND (EUS);  Surgeon: Asberry DELENA Coffee, MD;  Location: [facility] ENDOSCOPY;  Service: Endoscopy;  Laterality: N/A;   IR RADIOLOGIST EVAL & MGMT  08/24/2023   LEFT HEART CATH AND CORONARY ANGIOGRAPHY N/A 12/31/2020   Procedure: LEFT HEART CATH AND CORONARY ANGIOGRAPHY;  Surgeon: Florencio Cara BIRCH, MD;  Location: [facility] INVASIVE CV LAB;  Service: Cardiovascular;  Laterality: N/A;   REVERSE SHOULDER ARTHROPLASTY Right 12/12/2017   Procedure: REVERSE SHOULDER ARTHROPLASTY;  Surgeon: Edie Norleen PARAS, MD;  Location: [facility] ORS;  Service: Orthopedics;  Laterality: Right;   TONSILLECTOMY     TOTAL KNEE ARTHROPLASTY Right 05/12/2016   Procedure: TOTAL KNEE ARTHROPLASTY;  Surgeon: Norleen PARAS Edie, MD;  Location: [facility] ORS;  Service: Orthopedics;  Laterality: Right;    Allergies: Patient has no known allergies.  Medications: Prior to Admission medications   Medication Sig Start Date End Date Taking? Authorizing Provider  allopurinol  (ZYLOPRIM ) 300 MG tablet Take 300 mg by mouth daily.    [provider]  amLODipine  (NORVASC ) 10 MG tablet Take 1 tablet (10 mg total) by mouth daily. 01/02/21   Callwood, Dwayne D, MD  aspirin  EC 81 MG tablet Take 81 mg by mouth daily. Swallow whole.    [provider]  colchicine  0.6 MG tablet Take 0.6 mg by mouth daily as needed. For gout flare-ups     [provider]  furosemide (LASIX) 20 MG tablet Take 20 mg by mouth daily. 07/03/23   [provider]  hydrALAZINE  (APRESOLINE ) 25 MG tablet Take 1 tablet (25 mg total) by mouth 3 (three) times daily. 01/01/21   Callwood, Dwayne D, MD  hydrochlorothiazide  (HYDRODIURIL ) 25 MG tablet Take 1 tablet (25 mg total) by mouth daily. 01/02/21   Callwood, Dwayne D, MD  metFORMIN  (GLUCOPHAGE ) 500 MG tablet Take 500 mg by mouth 2 (two) times daily.     [provider]  metoprolol  tartrate (LOPRESSOR ) 25 MG tablet Take 25 mg by mouth 2 (two) times daily.    [provider]  olmesartan  (BENICAR ) 40 MG tablet Take 40 mg by mouth daily. 12/07/20   [provider]  potassium chloride  SA (K-DUR,KLOR-CON ) 20 MEQ tablet Take 20 mEq by mouth 3 (three) times daily.     [provider]  pravastatin  (PRAVACHOL ) 20 MG tablet Take 20 mg by mouth daily. Patient not taking: Reported on 09/07/2023 05/25/23   [provider]  rosuvastatin  (CRESTOR )  20 MG tablet Take 1 tablet (20 mg total) by mouth daily. 01/01/21   Callwood, Dwayne D, MD  tamsulosin (FLOMAX) 0.4 MG CAPS capsule Take 0.4 mg by mouth daily. 09/17/20   [provider]     Family History  Problem Relation Age of Onset   Heart disease Mother    Stroke Father    Hypertension Father    Gout Father    Cancer Brother    Bone cancer Son    Brain cancer Son     Social History   Socioeconomic History   Marital status: Married    Spouse name: Not on file   Number of children: Not on file   Years of education: Not on file   Highest education level: Not on file  Occupational History   Occupation: works in cigarette factory  Tobacco Use   Smoking status: Former    Current packs/day: 0.00    Average packs/day: 1 pack/day for 30.0 years (30.0 ttl pk-yrs)    Types: Cigarettes    Start date: 09/28/1958    Quit date: 09/28/1988    Years since quitting: 34.9   Smokeless tobacco: Never   Tobacco  comments:    quit 1990  Vaping Use   Vaping status: Never Used  Substance and Sexual Activity   Alcohol use: No   Drug use: No   Sexual activity: Yes  Other Topics Concern   Not on file  Social History Narrative   Not on file   Social Drivers of Health   Financial Resource Strain: Medium Risk (03/29/2023)   Received from [facility]   Overall Financial Resource Strain (CARDIA)    Difficulty of Paying Living Expenses: Somewhat hard  Food Insecurity: No Food Insecurity (03/29/2023)   Received from [facility]   Hunger Vital Sign    Worried About Running Out of Food in the Last Year: Never true    Ran Out of Food in the Last Year: Never true  Transportation Needs: No Transportation Needs (03/29/2023)   Received from [facility] - Transportation    In the past 12 months, has lack of transportation kept you from medical appointments or from getting medications?: No    Lack of Transportation (Non-Medical): No  Physical Activity: Not on file  Stress: Not on file  Social Connections: Not on file     Review of Systems: A 12 point ROS discussed and pertinent positives are indicated in the HPI above.  All other systems are negative.  Review of Systems  Vital Signs: BP (!) 146/74   Pulse 72   Temp 98.2 F (36.8 C) (Oral)   Resp 18   Ht 5' 7 (1.702 m)   Wt 202 lb 4.8 oz (91.8 kg)   SpO2 96%   BMI 31.68 kg/m    Physical Exam Constitutional:      Appearance: He is not ill-appearing.  HENT:     Mouth/Throat:     Mouth: Mucous membranes are moist.     Pharynx: Oropharynx is clear.  Cardiovascular:     Rate and Rhythm: Normal rate and regular rhythm.     Heart sounds: Normal heart sounds.  Pulmonary:     Effort: Pulmonary effort is normal. No respiratory distress.     Breath sounds: Normal breath sounds.  Abdominal:     General: Abdomen is flat. There is no distension.     Palpations: Abdomen is soft.   Skin:  General: Skin is warm and dry.  Neurological:     General: No focal deficit present.     Mental Status: He is alert and oriented to person, place, and time.  Psychiatric:        Mood and Affect: Mood normal.        Thought Content: Thought content normal.     Imaging: IR Radiologist Eval & Mgmt Result Date: 08/24/2023 EXAM: NEW PATIENT OFFICE VISIT CHIEF COMPLAINT: SEE NOTE IN EPIC HISTORY OF PRESENT ILLNESS: SEE NOTE IN EPIC REVIEW OF SYSTEMS: SEE NOTE IN EPIC PHYSICAL EXAMINATION: SEE NOTE IN EPIC ASSESSMENT AND PLAN: SEE NOTE IN EPIC Electronically Signed   By: Wilkie Lent M.D.   On: 08/24/2023 13:59    Labs:  CBC: Recent Labs    05/09/23 1158 08/07/23 1307  WBC 9.3 9.2  HGB 9.5* 10.5*  HCT 31.3* 34.4*  PLT 420* 371    COAGS: No results for input(s): INR, APTT in the last 8760 hours.  BMP: Recent Labs    08/07/23 1307  NA 139  K 3.1*  CL 98  CO2 29  GLUCOSE 96  BUN 38*  CALCIUM  9.5  CREATININE 2.31*  GFRNONAA 29*    LIVER FUNCTION TESTS: No results for input(s): BILITOT, AST, ALT, ALKPHOS, PROT, ALBUMIN in the last 8760 hours.    Assessment and Plan:  Left renal mass. Plan for left renal biopsy and thermal ablation Labs pending  IR Image Guided left sided renal mass biopsy with concurrent thermal ablation  Risks and benefits discussed with the patient including, but not limited to bleeding, infection, vascular injury, contrast induced renal failure, non-target embolization or  ischemia. All of the patient's questions were answered, patient is agreeable to proceed. Consent signed and in chart.    Thank you for this interesting consult.  I greatly enjoyed meeting Maikel Neisler and look forward to participating in their care.  A copy of this report was sent to the requesting provider on this date.  Electronically Signed: Franky Rusk PA-C Interventional Radiology 09/11/2023 7:53 AM     I spent a total of 30  minutes in face to face in clinical consultation, greater than 50% of which was counseling/coordinating care for left renal mass biopsy and ablation

## 2023-09-08 NOTE — Progress Notes (Signed)
 Perioperative / Anesthesia Services  Pre-Admission Testing Clinical Review / Pre-Operative Anesthesia Consult  Date: 09/08/23  Patient Demographics:  Name: Eric Browning DOB: [birth year] MRN:   [id number]  Planned Surgical Procedure(s):    Date/Time: 09/11/23 0830   Procedure: CT RENAL TUMOR ABLATION UNILATERAL   Diagnosis: Renal mass [N28.89]   Indications: CT-guided biopsy and concurrent CT-guided microwave ablation of left sided renal mass   Location: [facility] CT [facility]ING      NOTE: Available PAT nursing documentation and vital signs have been reviewed. Clinical nursing staff has updated patient's PMH/PSHx, current medication list, and drug allergies/intolerances to ensure comprehensive history available to assist in medical decision making as it pertains to the aforementioned surgical procedure and anticipated anesthetic course. Extensive review of available clinical information personally performed. [place] PMH and PSHx updated with any diagnoses/procedures that  may have been inadvertently omitted during his intake with the pre-admission testing department's nursing staff.  Clinical Discussion:  Eric Browning is a 73 y.o. adult who is submitted for pre-surgical anesthesia review and clearance prior to him undergoing the above procedure. Patient is a Former Smoker (30 pack years; quit 08/1988). Pertinent PMH includes: CAD, cardiomyopathy, HFrEF, CVA, LEFT vertebral artery stenosis, cardiac murmur, angina at rest, cardiomegaly, HTN, HLD, T2DM, CKD-III, DOE, OSAH (requires nocturnal PAP therapy), GERD (takes H2 blocker PRN), hiatal hernia, IDA, OA, cervical DDD, LEFT renal mass, BILATERAL renal cysts, BPH.   Patient is followed by cardiology Philippe, MD). He was last seen in the cardiology clinic on 08/17/2023; notes reviewed. At the time of his clinic visit, patient complaining of chronic exertional dyspnea. Symptom reported to be stable and at baseline. No  discreet chest pain reported. He was also reporting intermittent episodes of vertiginous symptoms that were self limiting and not related to position changes; no presyncope/syncope. Patient denied any PND, orthopnea, palpitations, significant peripheral edema, weakness, or increasing fatigue.  Patient with a past medical history significant for cardiovascular diagnoses. Documented physical exam was grossly benign, providing no evidence of acute exacerbation and/or decompensation of the patient's known cardiovascular conditions.  Myocardial perfusion imaging study was performed on 12/09/2016 revealing normal left ventricular systolic function with an EF of >55%.  There was no evidence of stress-induced myocardial ischemia or arrhythmia; no scintigraphic evidence of scar.  Study determined to be normal and low risk.  MRI imaging of the brain performed on 03/12/2017 revealed an area of encephalomalacia in the RIGHT posterior parietal region consistent with a small chronic temporoparietal infarction.  Based on documented assessments, patient with no significant neurological deficits following this event.  Patient underwent diagnostic LEFT heart catheterization on 12/31/2020 revealing multivessel CAD; 50% proximal RCA, 90% distal RCA, and minor luminal irregularities noted within the LAD  and LCx.  Patient subsequently underwent PCI placing a 3.0 x 15 mm Resolute Onyx DES x 1 to the distal RCA lesion yielding excellent angiographic result and TIMI-3 flow.  Most recent TTE was performed on 03/06/2023 revealing a mildly reduced left ventricular systolic function with an EF of 40%.  There was septal, apical, and inferior hypokinesis noted.  Left atrium found to be severely enlarged.  There was mild aortic, tricuspid, and pulmonary, in addition to moderate mitral valve regurgitation.  All transvalvular gradients were noted to be normal providing no evidence suggestive of valvular stenosis.  RVSP normal at 35.3 mmHg.  Aorta normal in size with no evidence of aneurysmal dilatation.  Following stent placement, patient was on daily DAPT therapy using ASA + clopidogrel for over 2 years.  Clopidogrel was discontinued by cardiology during this visit.  Patient to continue daily antithrombotic therapy using low-dose ASA only.  Patient with a cardiomyopathy and resulting HFrEF diagnosis.  Heart failure and blood pressure well controlled on currently prescribed CCB (amlodipine ), diuretic (furosemide + HCTZ), vasodilator (hydralazine ), beta-blocker (metoprolol  tartrate), and ARB (olmesartan ) therapies; blood pressure documented at 120/72 mmHg.  With the use of the dual diuretic therapies, patient is on oral potassium supplement 3 times daily.  Patient is on statin for his HLD diagnosis and ASCVD prevention. T2DM well controlled on currently prescribed regimen; last HgbA1c was 6.1% when checked on 07/25/2023. He does have an OSAH diagnosis and is reported to be compliant with prescribed nocturnal PAP therapy. Functional capacity is limited by patient's age and multiple medical comorbidities.  With that said, patient is able to complete all of his ADL/IADLs without significant cardiovascular limitation. Per the DASI, patient is not belt to able to achieve 4 METS of physical activity without experiencing, at least  to some degree, significant degree of angina/anginal equivalent symptoms No changes were made to his medication regimen during his visit with cardiology.  Patient scheduled to follow-up with outpatient cardiology in 6 months or sooner if needed.  Eric Browning underwent renal ultrasound on 12/13/2022 that revealed a 2.7 x 2.4 x 2.5 mass in the patient's LEFT kidney.  Subsolid neoplasm could not be excluded.  Further characterization via MRI was recommended.  Patient underwent abdominal MRI on 12/22/2022 revealing a 2.9 x 2.6 x 2.4 cm mass in the anterior aspect of the LEFT upper kidney.  Abdominal MRI was repeated on 06/14/2023 revealing interval increase in size of mass to 2.8 x 3.2 cm; renal cell carcinoma favored.  Patient was referred to interventional radiology for further evaluation and discussions regarding possible CT-guided tumor ablation.  After meeting with radiology, patient has elected to proceed with recommended CT  GUIDED ABLATION OF LEFT RENAL TUMOR on 09/11/2023 with Dr. Wilkie Lent, MD. given patient's history of significant cardiovascular and cardiopulmonary diagnoses, presurgical clearances have been sought from patient's primary cardiology and pulmonary medicine teams.  Specialty clearances were obtained as follows.  Per pulmonary medicine Alica, MD), patient is stable from pulmonary perspective.  Patient may proceed with planned intervention at an overall MODERATE risk of experiencing significant perioperative cardiopulmonary complications.  Per cardiology Philippe, MD), patient should be fine for surgery. He would be at a MILD/MODERATE risk based for this non-cardiac surgery. No testing or modifications are deemed to be necessary to further reduce his risk. Patient may proceed as planned.   In review of the patient's chart, it is noted that he is on daily oral antithrombotic therapy. He has been instructed on recommendations for holding his daily low-dose ASA for 5 days prior  to his procedure with plans to restart as soon as postoperative bleeding risk felt to be minimized by his attending surgeon. The patient has been instructed that his last dose of ASA should be on 09/05/2023.  Patient denies previous perioperative complications with anesthesia in the past. In review his EMR, it is noted that patient underwent a general anesthetic course here at [facility] (ASA III) in 03/2022 without documented complications.      08/24/2023    1:34 PM 08/07/2023    1:28 PM 07/19/2023   11:02 AM  Vitals with BMI  Weight  204 lbs 10 oz 202 lbs 10 oz  Systolic 151 133 831  Diastolic 77 63 80  Pulse 97 68 84   Providers/Specialists:  NOTE: Primary physician provider listed below. Patient may have been seen by APP or partner within same practice.   PROVIDER ROLE / SPECIALTY LAST Eric Polly Wilkie, MD Interventional Radiology (Proceduralist) 08/24/2023  Alla Amis, MD Primary Care Provider 07/06/2023  Florencio Shine, MD Cardiology 08/17/2023  Rennie Lighter, MD Medical Oncology 08/07/2023  Penne Knee, MD Urology 07/19/2023  Dominica Brandy, MD Nephrology 08/01/2023   Allergies:  No Known Allergies Current Home Medications:    allopurinol  (ZYLOPRIM ) 300 MG tablet   amLODipine  (NORVASC ) 10 MG tablet   aspirin  EC 81 MG tablet   colchicine  0.6 MG tablet   hydrALAZINE  (APRESOLINE ) 25 MG tablet   hydrochlorothiazide  (HYDRODIURIL ) 25 MG tablet   metFORMIN  (GLUCOPHAGE ) 500 MG tablet   metoprolol  tartrate (LOPRESSOR ) 25 MG tablet   olmesartan  (BENICAR ) 40 MG tablet   potassium chloride  SA (K-DUR,KLOR-CON ) 20 MEQ tablet   rosuvastatin  (CRESTOR ) 20 MG tablet   tamsulosin (FLOMAX) 0.4 MG CAPS capsule   furosemide (LASIX) 20 MG tablet   pravastatin  (PRAVACHOL ) 20 MG tablet   No current facility-administered medications for this encounter.   History:   Past Medical History:  Diagnosis Date   Angina at rest [facility])     Arthritis of both hands    Bilateral cataracts    Bilateral renal cysts    BPH (benign prostatic hyperplasia)    CAD (coronary artery disease)    a.) LHC/PCI 12/31/2020: 90% dRCA (3.0 x 15 mm Resolute Onyx DES); 50% pRCA, minor lum irregs in LAD and LCx   Cardiomegaly    Cardiomyopathy (HCC)    a.) TTE 07/03/2015: EF 50%; b.) MV 07/03/2015: EF 53%; c.) TTE 01/08/2019: EF 45%; d.) MV 01/08/2019: EF 45-50%; e.) TTE 11/26/2020: EF 45%; f.) MV 11/26/2020: EF 51%; g.) TTE 03/06/2023: EF 40%   CKD (chronic kidney disease), stage III (HCC)  CVA (cerebral vascular accident) [facility])    a.) noted on CT head 03/12/2017: RIGHT posterior  parietal encephalomalacia consistent with small chronic temporoparietal infarct   DDD (degenerative disc disease), cervical    Dyspnea on exertion    GERD (gastroesophageal reflux disease)    Gout    Heart murmur    HFrEF (heart failure with reduced ejection fraction) (HCC)    a.) TTE 07/03/2015: EF 50%, mild-mod LVH, inf/post HK, G1DD, triv MR/PR, mild AR/TR, RVSP 36.8; b.) TTE 01/08/2019: EF 45%, sep/apical HK, mild LVH, mild LAE, mild panval regurg, RVSP 37.2; d.) TTE 11/26/2020: EF 45%, apical.inf/post HK, G1DD, mild LAE/LVE, triv AR/PR, mild MR/TR, RVSP 52.9; e.)  TTE 03/06/2023: EF 40%, sep/apical/inf HK, sev LAE, mild AR/TR/PR, mod MR, RVSP 35.3   Hiatal hernia    High cholesterol    Hypertension    IDA (iron deficiency anemia)    Left renal mass 12/13/2022   a.) US  renal 12/13/2022: 2.7 x 2.4 x 2.5 cm; b.) MRI abdomen 12/22/2022: 2.9 x 2.6 x 2.4 cm LEFT anterior upper kidney; c.) MRI abdomen 06/14/2023: interval increase in size to 2.8 x 3.2 cm --> 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   Long term current use of aspirin     OSA on CPAP    Primary hypogonadism in male    Stenosis of left vertebral artery (severe)    T2DM (type 2 diabetes mellitus) (HCC)    Past Surgical History:  Procedure Laterality Date   CATARACT EXTRACTION W/ INTRAOCULAR LENS  IMPLANT Right 2013   COLONOSCOPY     COLONOSCOPY N/A 04/06/2022   Procedure: COLONOSCOPY;  Surgeon: Toledo, Ladell POUR, MD;  Location: [facility] ENDOSCOPY;  Service: Gastroenterology;  Laterality: N/A;   COLONOSCOPY WITH PROPOFOL  N/A 08/03/2015   Procedure: COLONOSCOPY WITH PROPOFOL ;  Surgeon: Lamar ONEIDA Holmes, MD;  Location: [facility] ENDOSCOPY;  Service: Endoscopy;  Laterality: N/A;   CORONARY STENT INTERVENTION N/A 12/31/2020   Procedure: CORONARY STENT INTERVENTION;  Surgeon: Florencio Cara BIRCH, MD;  Location: [facility] INVASIVE CV LAB;  Service: Cardiovascular;  Laterality: N/A;  RCA   EUS N/A 09/03/2015   Procedure: LOWER ENDOSCOPIC ULTRASOUND (EUS);  Surgeon: Asberry DELENA Coffee, MD;  Location: [facility] ENDOSCOPY;  Service: Endoscopy;  Laterality: N/A;   IR RADIOLOGIST EVAL & MGMT  08/24/2023   LEFT HEART CATH AND CORONARY ANGIOGRAPHY N/A 12/31/2020   Procedure: LEFT HEART CATH AND CORONARY ANGIOGRAPHY;  Surgeon: Florencio Cara BIRCH, MD;  Location: [facility] INVASIVE CV LAB;  Service: Cardiovascular;  Laterality: N/A;   REVERSE SHOULDER ARTHROPLASTY Right 12/12/2017   Procedure: REVERSE SHOULDER ARTHROPLASTY;  Surgeon: Edie Norleen PARAS, MD;  Location: [facility] ORS;  Service: Orthopedics;  Laterality: Right;   TONSILLECTOMY     TOTAL KNEE ARTHROPLASTY Right 05/12/2016   Procedure: TOTAL KNEE ARTHROPLASTY;  Surgeon: Norleen PARAS Edie, MD;  Location: [facility] ORS;  Service: Orthopedics;  Laterality: Right;   Family History  Problem Relation Age of Onset   Heart disease Mother    Stroke Father    Hypertension Father    Gout Father    Cancer Brother    Bone cancer Son    Brain cancer Son    Social History   Tobacco Use   Smoking status: Former    Current packs/day: 0.00    Average packs/day: 1 pack/day for 30.0 years (30.0 ttl pk-yrs)    Types: Cigarettes    Start date: 09/28/1958    Quit date: 09/28/1988    Years since quitting: 34.9  Smokeless tobacco: Never   Tobacco comments:    quit 1990  Substance Use Topics    Alcohol use: No   Pertinent Clinical Results:  LABS:  No visits with results within 30 Day(s) from this visit.  Latest known visit with results is:  Appointment on 08/07/2023  Component Date Value Ref Range Status   Ferritin 08/07/2023 19 (L)  24 - 336 ng/mL Final   Performed at [facility], [address] [place], [place] [address]   Iron 08/07/2023 49  45 - 182 ug/dL Final   TIBC 87/90/7975 409  250 - 450 ug/dL Final   Saturation Ratios 08/07/2023 12 (L)  17.9 - 39.5 % Final   UIBC 08/07/2023 360  ug/dL Final   Performed at [facility], [address] [place], [place] [address]   WBC Count 08/07/2023 9.2  4.0 - 10.5 K/uL Final   RBC 08/07/2023 4.22  4.22 - 5.81 MIL/uL Final   Hemoglobin 08/07/2023 10.5 (L)  13.0 - 17.0 g/dL Final   HCT 87/90/7975 34.4 (L)  39.0 - 52.0 % Final   MCV 08/07/2023 81.5  80.0 - 100.0 fL Final   MCH 08/07/2023 24.9 (L)  26.0 - 34.0 pg Final   MCHC 08/07/2023 30.5  30.0 - 36.0 g/dL Final   RDW 87/90/7975 17.2 (H)  11.5 - 15.5 % Final   Platelet Count 08/07/2023 371  150 - 400 K/uL Final   nRBC 08/07/2023 0.0  0.0 - 0.2 % Final   Neutrophils Relative % 08/07/2023 68  % Final   Neutro Abs 08/07/2023 6.3  1.7 - 7.7 K/uL Final   Lymphocytes Relative 08/07/2023 19  % Final   Lymphs Abs 08/07/2023 1.8  0.7 - 4.0 K/uL Final   Monocytes Relative 08/07/2023 9  % Final   Monocytes Absolute 08/07/2023 0.8  0.1 - 1.0 K/uL Final   Eosinophils Relative 08/07/2023 3  % Final   Eosinophils Absolute 08/07/2023 0.2  0.0 - 0.5 K/uL Final   Basophils Relative 08/07/2023 1  % Final   Basophils Absolute 08/07/2023 0.1  0.0 - 0.1 K/uL Final   Immature Granulocytes 08/07/2023 0  % Final   Abs Immature Granulocytes 08/07/2023 0.03  0.00 - 0.07 K/uL Final   Performed at [facility], [address] [place], [place] [address]   Sodium 08/07/2023 139  135 - 145 mmol/L Final   Potassium 08/07/2023 3.1 (L)  3.5 - 5.1 mmol/L Final   Chloride  08/07/2023 98  98 - 111 mmol/L Final   CO2 08/07/2023 29  22 - 32 mmol/L Final   Glucose, Bld 08/07/2023 96  70 - 99 mg/dL Final   Glucose reference range applies only to samples taken after fasting for at least 8 hours.   BUN 08/07/2023 38 (H)  8 - 23 mg/dL Final   Creatinine 87/90/7975 2.31 (H)  0.61 - 1.24 mg/dL Final   Calcium  08/07/2023 9.5  8.9 - 10.3 mg/dL Final   GFR, Estimated 08/07/2023 29 (L)  >60 mL/min Final   Comment: (NOTE) Calculated using the CKD-EPI Creatinine Equation (2021)    Anion gap 08/07/2023 12  5 - 15 Final   Performed at [facility], [address] [place], [place] [address]    ECG: Date: 09/17/2023  Time ECG obtained: 1320 PM Rate: 90 bpm Rhythm:  Sinus rhythm with first-degree AV block Axis (leads I and aVF): normal Intervals: PR 218 ms. QRS 92 ms. QTc 455 ms. ST segment and T  wave changes: Nonspecific inferior ST-T wave abnormalities. Evidence of an age undetermined septal infarct noted.  Comparison: Similar to previous tracing obtained on 01/01/2021   IMAGING / PROCEDURES: MR ABDOMEN W WO CONTRAST performed on 06/14/2023 Mild cardiomegaly.  There is a small sliding hiatal hernia  Re demonstration of multiple renal lesions, described as follows:  There is a 2.8 x 3.2 cm heterogeneous, T2 hyperintense enhancing lesion, partially exophytically arising from the left kidney upper pole, anteriorly, favored to represent neoplastic process. There is slight interval increase in the size when compared to the prior exam.  There is well-circumscribed partially exophytic T1 hyperintense approximately 1.4 x 1.4 cm lesion arising from the left kidney lower pole, laterally, favored to represent a proteinaceous/hemorrhagic cyst. There multiple additional smaller proteinaceous/hemorrhagic cyst in the bilateral kidney upper poles (marked with electronic arrow sign on series 12).  There are multiple additional remaining T2 hyperintense lesions throughout  bilateral kidneys, favored to represent simple renal cysts with largest partially exophytic cyst arising from the right kidney interpolar region, laterally measuring up to 2.7 x 2.9 cm. No suspicious bone lesions identified.   US  RENAL performed on 12/13/2022 The kidneys are diffusely echogenic consistent with medical renal disease. The left kidney contains a 2.7 x 2.4 x 2.5 cm hypoechoic mass with increased through transmission and no internal blood flow. This mass is indeterminate. A solid neoplasm is not excluded. Recommend MRI of the abdomen with and without contrast for further evaluation.  LEFT HEART CATHETERIZATION AND CORONARY ANGIOGRAPHY performed on 12/31/2020 Low normal left ventricular systolic function with an EF of 50-55% Multivessel CAD 90% distal RCA 50% proximal RCA Minor luminal irregularities in the LAD and LCx Successful PCI 3.0 x 15 mm Resolute Onyx DES to the distal RCA yielding excellent angiographic result and TIMI-3 flow.   MYOCARDIAL PERFUSION IMAGING STUDY (LEXISCAN ) performed on 12/09/2016 Normal left ventricular systolic function with an EF of 65%.   Patient experienced chest pain and shortness of breath during the study; poor exercise tolerance; only able to achieve 1 MET of physical activity. No evidence of significant stress-induced myocardial ischemia or arrhythmia; no scintigraphic evidence of scar.  Impression and Plan:  Eric Browning has been referred for pre-anesthesia review and clearance prior to him undergoing the planned anesthetic and procedural courses. Available labs, pertinent testing, and imaging results were personally reviewed by me in preparation for upcoming operative/procedural course. [facility] medical record has been updated following extensive record review and patient interview with PAT staff. Patient will need repeat labs prior to procedure. Orders were entered today by IR APP, however patient unable to return to clinic today. Lab orders  will need to be carried when patient arrives for procedure on Monday.  This patient has been appropriately cleared by cardiology (MILD/MODERATE) and by pulmonary medicine (MODERATE) with the individually indicated risks of significant cardiovascular/cardiopulmonary complications. Based on clinical review performed today (09/08/23), barring any significant acute changes in the patient's overall condition, it is anticipated that he will be able to proceed with the planned surgical intervention. Any acute changes in clinical condition may necessitate his procedure being postponed and/or cancelled. Patient will meet with anesthesia team (MD and/or CRNA) on the day of his procedure for preoperative evaluation/assessment. Questions regarding anesthetic course will be fielded at that time.   Pre-surgical instructions were reviewed with the patient during his PAT appointment, and questions were fielded to satisfaction by PAT clinical staff. He has been instructed on which medications that he will need to hold  prior to surgery, as well as the ones that have been deemed safe/appropriate to take on the day of his procedure. As part of the general education provided by PAT, patient made aware both verbally and in writing, that he would need to abstain from the use of any illegal substances during his perioperative course. He was advised that failure to follow the provided instructions could necessitate case cancellation or result in serious perioperative complications up to and including death. Patient encouraged to contact PAT and/or his surgeon's office to discuss any questions or concerns that may arise prior to surgery; verbalized understanding.   Eric Pereyra, MSN, APRN, FNP-C, CEN [facility]  Perioperative Services Nurse Practitioner Phone: [phone] Fax: [phone] 09/08/23 6:34 PM  NOTE: This note has been prepared using Dragon dictation software. Despite my best ability to  proofread, there is always the potential that unintentional transcriptional errors may still occur from this process.

## 2023-09-11 ENCOUNTER — Ambulatory Visit
Admission: RE | Admit: 2023-09-11 | Discharge: 2023-09-11 | Disposition: A | Discharge status: Home / Self Care | Payer: Medicare Other | Source: Ambulatory Visit | Attending: Interventional Radiology | Admitting: Interventional Radiology

## 2023-09-11 ENCOUNTER — Other Ambulatory Visit: Payer: Self-pay | Admitting: Interventional Radiology

## 2023-09-11 ENCOUNTER — Other Ambulatory Visit: Payer: Self-pay

## 2023-09-11 ENCOUNTER — Other Ambulatory Visit: Payer: Self-pay | Admitting: Radiology

## 2023-09-11 ENCOUNTER — Encounter: Payer: Self-pay | Admitting: Urgent Care

## 2023-09-11 VITALS — BP 120/61 | HR 76 | Temp 97.8°F | Resp 16 | Ht 67.0 in | Wt 202.3 lb

## 2023-09-11 DIAGNOSIS — I5022 Chronic systolic (congestive) heart failure: Secondary | ICD-10-CM | POA: Insufficient documentation

## 2023-09-11 DIAGNOSIS — I2510 Atherosclerotic heart disease of native coronary artery without angina pectoris: Secondary | ICD-10-CM | POA: Insufficient documentation

## 2023-09-11 DIAGNOSIS — E1122 Type 2 diabetes mellitus with diabetic chronic kidney disease: Secondary | ICD-10-CM | POA: Insufficient documentation

## 2023-09-11 DIAGNOSIS — K219 Gastro-esophageal reflux disease without esophagitis: Secondary | ICD-10-CM | POA: Insufficient documentation

## 2023-09-11 DIAGNOSIS — C642 Malignant neoplasm of left kidney, except renal pelvis: Secondary | ICD-10-CM

## 2023-09-11 DIAGNOSIS — N2889 Other specified disorders of kidney and ureter: Secondary | ICD-10-CM

## 2023-09-11 DIAGNOSIS — Z8673 Personal history of transient ischemic attack (TIA), and cerebral infarction without residual deficits: Secondary | ICD-10-CM | POA: Insufficient documentation

## 2023-09-11 DIAGNOSIS — Z5986 Financial insecurity: Secondary | ICD-10-CM | POA: Insufficient documentation

## 2023-09-11 DIAGNOSIS — Z79899 Other long term (current) drug therapy: Secondary | ICD-10-CM | POA: Insufficient documentation

## 2023-09-11 DIAGNOSIS — Z7984 Long term (current) use of oral hypoglycemic drugs: Secondary | ICD-10-CM | POA: Diagnosis not present

## 2023-09-11 DIAGNOSIS — I130 Hypertensive heart and chronic kidney disease with heart failure and stage 1 through stage 4 chronic kidney disease, or unspecified chronic kidney disease: Secondary | ICD-10-CM | POA: Diagnosis not present

## 2023-09-11 DIAGNOSIS — Z85528 Personal history of other malignant neoplasm of kidney: Secondary | ICD-10-CM | POA: Insufficient documentation

## 2023-09-11 DIAGNOSIS — Z955 Presence of coronary angioplasty implant and graft: Secondary | ICD-10-CM | POA: Diagnosis not present

## 2023-09-11 DIAGNOSIS — Z87891 Personal history of nicotine dependence: Secondary | ICD-10-CM | POA: Insufficient documentation

## 2023-09-11 DIAGNOSIS — R079 Chest pain, unspecified: Secondary | ICD-10-CM

## 2023-09-11 DIAGNOSIS — I10 Essential (primary) hypertension: Secondary | ICD-10-CM

## 2023-09-11 HISTORY — DX: Primary osteoarthritis, right hand: M19.041

## 2023-09-11 HISTORY — DX: Atherosclerotic heart disease of native coronary artery without angina pectoris: I25.10

## 2023-09-11 HISTORY — DX: Cardiomegaly: I51.7

## 2023-09-11 HISTORY — DX: Chronic kidney disease, stage 3 unspecified: N18.30

## 2023-09-11 HISTORY — DX: Long term (current) use of aspirin: Z79.82

## 2023-09-11 HISTORY — DX: Diaphragmatic hernia without obstruction or gangrene: K44.9

## 2023-09-11 HISTORY — DX: Occlusion and stenosis of left vertebral artery: I65.02

## 2023-09-11 HISTORY — DX: Cardiomyopathy, unspecified: I42.9

## 2023-09-11 HISTORY — DX: Unspecified systolic (congestive) heart failure: I50.20

## 2023-09-11 HISTORY — DX: Other forms of angina pectoris: I20.89

## 2023-09-11 HISTORY — DX: Testicular hypofunction: E29.1

## 2023-09-11 HISTORY — DX: Other forms of dyspnea: R06.09

## 2023-09-11 HISTORY — DX: Obstructive sleep apnea (adult) (pediatric): G47.33

## 2023-09-11 HISTORY — DX: Unspecified cataract: H26.9

## 2023-09-11 HISTORY — DX: Cerebral infarction, unspecified: I63.9

## 2023-09-11 HISTORY — DX: Type 2 diabetes mellitus without complications: E11.9

## 2023-09-11 HISTORY — DX: Other cervical disc degeneration, unspecified cervical region: M50.30

## 2023-09-11 HISTORY — DX: Cyst of kidney, acquired: N28.1

## 2023-09-11 LAB — CBC
HCT: 30.4 % — ABNORMAL LOW (ref 39.0–52.0)
Hemoglobin: 9.6 g/dL — ABNORMAL LOW (ref 13.0–17.0)
MCH: 25.7 pg — ABNORMAL LOW (ref 26.0–34.0)
MCHC: 31.6 g/dL (ref 30.0–36.0)
MCV: 81.5 fL (ref 80.0–100.0)
Platelets: 329 10*3/uL (ref 150–400)
RBC: 3.73 MIL/uL — ABNORMAL LOW (ref 4.22–5.81)
RDW: 16.6 % — ABNORMAL HIGH (ref 11.5–15.5)
WBC: 7.3 10*3/uL (ref 4.0–10.5)
nRBC: 0 % (ref 0.0–0.2)

## 2023-09-11 LAB — COMPREHENSIVE METABOLIC PANEL
ALT: 9 U/L (ref 0–44)
AST: 12 U/L — ABNORMAL LOW (ref 15–41)
Albumin: 3.8 g/dL (ref 3.5–5.0)
Alkaline Phosphatase: 62 U/L (ref 38–126)
Anion gap: 13 (ref 5–15)
BUN: 43 mg/dL — ABNORMAL HIGH (ref 8–23)
CO2: 27 mmol/L (ref 22–32)
Calcium: 9.4 mg/dL (ref 8.9–10.3)
Chloride: 99 mmol/L (ref 98–111)
Creatinine, Ser: 2.4 mg/dL — ABNORMAL HIGH (ref 0.61–1.24)
GFR, Estimated: 28 mL/min — ABNORMAL LOW (ref >60)
Glucose, Bld: 123 mg/dL — ABNORMAL HIGH (ref 70–99)
Potassium: 3.3 mmol/L — ABNORMAL LOW (ref 3.5–5.1)
Sodium: 139 mmol/L (ref 135–145)
Total Bilirubin: 0.5 mg/dL (ref 0.0–1.2)
Total Protein: 6.3 g/dL — ABNORMAL LOW (ref 6.5–8.1)

## 2023-09-11 LAB — GLUCOSE, CAPILLARY
Glucose-Capillary: 125 mg/dL — ABNORMAL HIGH (ref 70–99)
Glucose-Capillary: 151 mg/dL — ABNORMAL HIGH (ref 70–99)
Glucose-Capillary: 129 mg/dL — ABNORMAL HIGH (ref 70–99)

## 2023-09-11 LAB — PROTIME-INR
INR: 1 (ref 0.8–1.2)
Prothrombin Time: 13.6 s (ref 11.4–15.2)

## 2023-09-11 LAB — TYPE AND SCREEN
ABO/RH(D): B POS
Antibody Screen: NEGATIVE

## 2023-09-11 MED ORDER — ONDANSETRON HCL 4 MG/2ML IJ SOLN
4.0000 mg | Freq: Once | INTRAMUSCULAR | Status: DC | PRN
  Filled 2023-09-11: qty 2

## 2023-09-11 MED ORDER — LACTATED RINGERS IV SOLN: INTRAVENOUS | Status: DC | PRN

## 2023-09-11 MED ORDER — PROPOFOL 10 MG/ML IV BOLUS
INTRAVENOUS | Status: AC
  Filled 2023-09-11: qty 40

## 2023-09-11 MED ORDER — GLYCOPYRROLATE 0.2 MG/ML IJ SOLN
INTRAMUSCULAR | Status: DC | PRN
  Administered 2023-09-11 (×2): .1 mg via INTRAVENOUS

## 2023-09-11 MED ORDER — ONDANSETRON HCL 4 MG/2ML IJ SOLN
INTRAMUSCULAR | Status: DC | PRN
  Administered 2023-09-11: 4 mg via INTRAVENOUS

## 2023-09-11 MED ORDER — OXYCODONE HCL 5 MG PO TABS
5.0000 mg | ORAL_TABLET | Freq: Once | ORAL | Status: DC | PRN
  Filled 2023-09-11: qty 1

## 2023-09-11 MED ORDER — ONDANSETRON HCL 4 MG/2ML IJ SOLN
INTRAMUSCULAR | Status: AC
  Filled 2023-09-11: qty 2

## 2023-09-11 MED ORDER — FENTANYL CITRATE (PF) 100 MCG/2ML IJ SOLN
INTRAMUSCULAR | Status: AC
  Filled 2023-09-11: qty 2

## 2023-09-11 MED ORDER — FENTANYL CITRATE PF 50 MCG/ML IJ SOSY
25.0000 ug | PREFILLED_SYRINGE | INTRAMUSCULAR | Status: DC | PRN
  Filled 2023-09-11: qty 1

## 2023-09-11 MED ORDER — SUGAMMADEX SODIUM 200 MG/2ML IV SOLN
INTRAVENOUS | Status: DC | PRN
  Administered 2023-09-11: 183.6 mg via INTRAVENOUS

## 2023-09-11 MED ORDER — PHENYLEPHRINE HCL-NACL 20-0.9 MG/250ML-% IV SOLN
INTRAVENOUS | Status: AC
Start: 2023-09-11 — End: ?
  Filled 2023-09-11: qty 250

## 2023-09-11 MED ORDER — SEVOFLURANE IN SOLN
RESPIRATORY_TRACT | Status: AC
  Filled 2023-09-11: qty 250

## 2023-09-11 MED ORDER — ROCURONIUM BROMIDE 10 MG/ML (PF) SYRINGE
PREFILLED_SYRINGE | INTRAVENOUS | Status: AC
  Filled 2023-09-11: qty 10

## 2023-09-11 MED ORDER — LIDOCAINE HCL (CARDIAC) PF 100 MG/5ML IV SOSY
PREFILLED_SYRINGE | INTRAVENOUS | Status: DC | PRN
  Administered 2023-09-11: 80 mg via INTRAVENOUS

## 2023-09-11 MED ORDER — LIDOCAINE HCL (PF) 2 % IJ SOLN
INTRAMUSCULAR | Status: AC
  Filled 2023-09-11: qty 5

## 2023-09-11 MED ORDER — PHENYLEPHRINE HCL-NACL 20-0.9 MG/250ML-% IV SOLN
INTRAVENOUS | Status: DC | PRN
  Administered 2023-09-11: 25 ug/min via INTRAVENOUS

## 2023-09-11 MED ORDER — FENTANYL CITRATE (PF) 100 MCG/2ML IJ SOLN
INTRAMUSCULAR | Status: DC | PRN
  Administered 2023-09-11: 50 ug via INTRAVENOUS

## 2023-09-11 MED ORDER — VASOPRESSIN 20 UNIT/ML IV SOLN
INTRAVENOUS | Status: DC | PRN
  Administered 2023-09-11 (×8): 1 [IU] via INTRAVENOUS

## 2023-09-11 MED ORDER — EPHEDRINE SULFATE-NACL 50-0.9 MG/10ML-% IV SOSY
PREFILLED_SYRINGE | INTRAVENOUS | Status: DC | PRN
  Administered 2023-09-11 (×2): 10 mg via INTRAVENOUS
  Administered 2023-09-11: 5 mg via INTRAVENOUS

## 2023-09-11 MED ORDER — OXYCODONE HCL 5 MG/5ML PO SOLN
5.0000 mg | Freq: Once | ORAL | Status: DC | PRN
  Filled 2023-09-11: qty 5

## 2023-09-11 MED ORDER — ROCURONIUM BROMIDE 100 MG/10ML IV SOLN
INTRAVENOUS | Status: DC | PRN
  Administered 2023-09-11: 10 mg via INTRAVENOUS
  Administered 2023-09-11: 60 mg via INTRAVENOUS

## 2023-09-11 MED ORDER — PROPOFOL 10 MG/ML IV BOLUS
INTRAVENOUS | Status: DC | PRN
  Administered 2023-09-11: 100 mg via INTRAVENOUS

## 2023-09-11 MED ORDER — GLYCOPYRROLATE 0.2 MG/ML IJ SOLN
INTRAMUSCULAR | Status: AC
  Filled 2023-09-11: qty 1

## 2023-09-11 MED ORDER — SODIUM CHLORIDE 0.9 % IV SOLN: INTRAVENOUS | Status: DC | PRN

## 2023-09-11 MED ORDER — ONDANSETRON HCL 4 MG/2ML IJ SOLN
4.0000 mg | Freq: Four times a day (QID) | INTRAMUSCULAR | Status: DC | PRN
  Filled 2023-09-11: qty 2

## 2023-09-11 MED ORDER — ACETAMINOPHEN 10 MG/ML IV SOLN
1000.0000 mg | Freq: Once | INTRAVENOUS | Status: DC | PRN
  Filled 2023-09-11: qty 100

## 2023-09-11 MED ORDER — HYDROCODONE-ACETAMINOPHEN 5-325 MG PO TABS
1.0000 | ORAL_TABLET | ORAL | Status: DC | PRN
Start: 2023-09-11 — End: 2023-09-12
  Filled 2023-09-11: qty 2

## 2023-09-11 NOTE — Procedures (Signed)
 Interventional Radiology Procedure Note  Procedure:  1.) CT guided biopsy of left renal mass 2.) CT guided MW ablation of left renal mass  Complications: None  Estimated Blood Loss: None  Recommendations: - Bedrest x 4 hrs - DC foley if no hematuria - Anticipate DC home   Signed,  Wilkie LOIS Lent, MD

## 2023-09-11 NOTE — Anesthesia Procedure Notes (Signed)
 Procedure Name: Intubation Date/Time: 09/11/2023 9:40 AM  Performed by: Duwayne Craven, CRNAPre-anesthesia Checklist: Patient identified, Patient being monitored, Timeout performed, Emergency Drugs available and Suction available Patient Re-evaluated:Patient Re-evaluated prior to induction Oxygen Delivery Method: Circle system utilized Preoxygenation: Pre-oxygenation with 100% oxygen Induction Type: IV induction Ventilation: Mask ventilation without difficulty and Oral airway inserted - appropriate to patient size Laryngoscope Size: McGrath and 4 Grade View: Grade I Tube type: Oral Tube size: 7.0 mm Number of attempts: 1 Airway Equipment and Method: Stylet and Video-laryngoscopy Placement Confirmation: ETT inserted through vocal cords under direct vision, positive ETCO2 and breath sounds checked- equal and bilateral Secured at: 22 cm Tube secured with: Tape Dental Injury: Teeth and Oropharynx as per pre-operative assessment

## 2023-09-11 NOTE — Anesthesia Preprocedure Evaluation (Addendum)
 Anesthesia Evaluation  Patient identified by MRN, date of birth, ID band Patient awake    Reviewed: Allergy & Precautions, NPO status , Patient's Chart, lab work & pertinent test results  History of Anesthesia Complications Negative for: history of anesthetic complications  Airway Mallampati: II  TM Distance: >3 FB Neck ROM: Full    Dental  (+) Edentulous Upper, Edentulous Lower   Pulmonary neg pulmonary ROS, sleep apnea and Continuous Positive Airway Pressure Ventilation , neg COPD, Patient abstained from smoking.Not current smoker, former smoker   Pulmonary exam normal breath sounds clear to auscultation       Cardiovascular Exercise Tolerance: Good METShypertension, + angina  + CAD, + Cardiac Stents and +CHF  (-) Past MI (-) dysrhythmias + Valvular Problems/Murmurs MR  Rhythm:Regular Rate:Normal - Systolic murmurs PCI 2 years ago. Doing well since then, denies CP with activity.  TTE 2024: INTERPRETATION  MILD LV SYSTOLIC DYSFUNCTION (See above)  NORMAL RIGHT VENTRICULAR SYSTOLIC FUNCTION  SMALL PERICARDIAL EFFUSION (See above)  MODERATE VALVULAR REGURGITATION (See above)  NO VALVULAR STENOSIS  AVA(VTI)= 1.59cm^2  SCLEROTIC AORTIC VALVE  MILD AR, TR, PR  MODERATE MR  EF 40%     Neuro/Psych CVA, No Residual Symptoms  negative psych ROS   GI/Hepatic hiatal hernia,GERD  Controlled,,(+)     (-) substance abuse    Endo/Other  diabetes, Well Controlled, Oral Hypoglycemic Agents    Renal/GU CRFRenal disease     Musculoskeletal   Abdominal   Peds  Hematology  (+) Blood dyscrasia, anemia   Anesthesia Other Findings Past Medical History: No date: Angina at rest [facility]) No date: Arthritis of both hands No date: Bilateral cataracts No date: Bilateral renal cysts No date: BPH (benign prostatic hyperplasia) No date: CAD (coronary artery disease)     Comment:  a.) LHC/PCI 12/31/2020: 90% dRCA (3.0 x 15 mm Resolute                Onyx DES); 50% pRCA, minor lum irregs in LAD and LCx No date: Cardiomegaly No date: Cardiomyopathy [facility])     Comment:  a.) TTE 07/03/2015: EF 50%; b.) MV 07/03/2015: EF 53%;               c.) TTE 01/08/2019: EF 45%; d.) MV 01/08/2019: EF 45-50%;              e.) TTE 11/26/2020: EF 45%; f.) MV 11/26/2020: EF 51%;               g.) TTE 03/06/2023: EF 40% No date: CKD (chronic kidney disease), stage III (HCC) No date: CVA (cerebral vascular accident) (HCC)     Comment:  a.) noted on CT head 03/12/2017: RIGHT posterior                parietal encephalomalacia consistent with small chronic               temporoparietal infarct No date: DDD (degenerative disc disease), cervical No date: Dyspnea on exertion No date: GERD (gastroesophageal reflux disease) No date: Gout No date: Heart murmur No date: HFrEF (heart failure with reduced ejection fraction) (HCC)     Comment:  a.) TTE 07/03/2015: EF 50%, mild-mod LVH, inf/post HK,               G1DD, triv MR/PR, mild AR/TR, RVSP 36.8; b.) TTE               01/08/2019: EF 45%, sep/apical HK, mild LVH, mild LAE,  mild panval regurg, RVSP 37.2; d.) TTE 11/26/2020: EF               45%, apical.inf/post HK, G1DD, mild LAE/LVE, triv AR/PR,               mild MR/TR, RVSP 52.9; e.)  TTE 03/06/2023: EF 40%,               sep/apical/inf HK, sev LAE, mild AR/TR/PR, mod MR, RVSP               35.3 No date: Hiatal hernia No date: High cholesterol No date: Hypertension No date: IDA (iron deficiency anemia) 12/13/2022: Left renal mass     Comment:  a.) US  renal 12/13/2022: 2.7 x 2.4 x 2.5 cm; b.) MRI               abdomen 12/22/2022: 2.9 x 2.6 x 2.4 cm LEFT anterior               upper kidney; c.) MRI abdomen 06/14/2023: interval               increase in size to 2.8 x 3.2 cm --> favored to represent              renal cell carcinoma No date: Long term current use of aspirin  No date: OSA on CPAP No date: Primary hypogonadism in  male No date: Stenosis of left vertebral artery (severe) No date: T2DM (type 2 diabetes mellitus) (HCC)  Reproductive/Obstetrics                             Anesthesia Physical Anesthesia Plan  ASA: 3  Anesthesia Plan: General   Post-op Pain Management: Ofirmev  IV (intra-op)*   Induction: Intravenous  PONV Risk Score and Plan: 2 and Ondansetron , Dexamethasone, Midazolam  and Treatment may vary due to age or medical condition  Airway Management Planned: Oral ETT and Video Laryngoscope Planned  Additional Equipment: None  Intra-op Plan:   Post-operative Plan: Extubation in OR  Informed Consent: I have reviewed the patients History and Physical, chart, labs and discussed the procedure including the risks, benefits and alternatives for the proposed anesthesia with the patient or authorized representative who has indicated his/her understanding and acceptance.     Dental advisory given  Plan Discussed with: CRNA and Surgeon  Anesthesia Plan Comments: (Discussed risks of anesthesia with patient, including PONV, sore throat, lip/dental/eye damage. Rare risks discussed as well, such as cardiorespiratory and neurological sequelae, and allergic reactions. Discussed the role of CRNA in patient's perioperative care. Patient understands.)       Anesthesia Quick Evaluation

## 2023-09-11 NOTE — Transfer of Care (Signed)
 Immediate Anesthesia Transfer of Care Note  Patient: Eric Browning  Procedure(s) Performed: CT RENAL TUMOR ABLATION UNILATERAL  Patient Location: PACU  Anesthesia Type:General  Level of Consciousness: awake, alert , and oriented  Airway & Oxygen Therapy: Patient Spontanous Breathing and Patient connected to face mask oxygen  Post-op Assessment: Report given to RN and Post -op Vital signs reviewed and stable  Post vital signs: Reviewed and stable  Last Vitals:  Vitals Value Taken Time  BP 146/73 09/11/23 1130  Temp    Pulse 85 09/11/23 1143  Resp 20 09/11/23 1143  SpO2 100 % 09/11/23 1143  Vitals shown include unfiled device data.  Last Pain:  Vitals:   09/11/23 1130  TempSrc:   PainSc: 0-No pain         Complications: No notable events documented.

## 2023-09-13 LAB — SURGICAL PATHOLOGY

## 2023-09-13 NOTE — Anesthesia Postprocedure Evaluation (Signed)
 Anesthesia Post Note  Patient: Eldrick Yahnke  Procedure(s) Performed: CT RENAL TUMOR ABLATION UNILATERAL CT RENAL BIOPSY  Patient location during evaluation: PACU Anesthesia Type: General Level of consciousness: awake and alert Pain management: pain level controlled Vital Signs Assessment: post-procedure vital signs reviewed and stable Respiratory status: spontaneous breathing, nonlabored ventilation, respiratory function stable and patient connected to nasal cannula oxygen Cardiovascular status: blood pressure returned to baseline and stable Postop Assessment: no apparent nausea or vomiting Anesthetic complications: no   No notable events documented.   Last Vitals:  Vitals:   09/11/23 1500 09/11/23 1600  BP: 125/64 120/61  Pulse: 76   Resp: 10 16  Temp:    SpO2: 90% 90%    Last Pain:  Vitals:   09/11/23 1600  TempSrc:   PainSc: 0-No pain                 Zula Hitch

## 2023-09-28 ENCOUNTER — Ambulatory Visit
Admission: RE | Admit: 2023-09-28 | Discharge: 2023-09-28 | Disposition: A | Discharge status: Home / Self Care | Payer: Medicare Other | Source: Ambulatory Visit | Attending: Radiology | Admitting: Radiology

## 2023-09-28 DIAGNOSIS — N2889 Other specified disorders of kidney and ureter: Secondary | ICD-10-CM

## 2023-09-28 HISTORY — PX: IR RADIOLOGIST EVAL & MGMT: IMG5224

## 2023-09-28 NOTE — Progress Notes (Signed)
Chief Complaint: Patient was consulted remotely today (TeleHealth) for left renal cell carcinoma at the request of Browning,Eric.    Referring Physician(s): Browning,Eric (via Dr. [name])  History of Present Illness: Eric Browning is a 73 y.o. adult with a history of an enlarging enhancing mass exophytic from the anterior upper pole of the left kidney.  He underwent combined percutaneous microwave ablation and biopsy performed on 09/11/2023.  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  The pathologist noted that this is typically a less aggressive and more indolent variant which is good news for him.  He tells me his postoperative course was uncomplicated.  He had no pain, hematuria or other symptoms.  He feels completely healed.  Past Medical History:  Diagnosis Date   Angina at rest [facility])    Arthritis of both hands    Bilateral cataracts    Bilateral renal cysts    BPH (benign prostatic hyperplasia)    CAD (coronary artery disease)    a.) LHC/PCI 12/31/2020: 90% dRCA (3.0 x 15 mm Resolute Onyx DES); 50% pRCA, minor lum irregs in LAD and LCx   Cardiomegaly    Cardiomyopathy (HCC)    a.) TTE 07/03/2015: EF 50%; b.) MV 07/03/2015: EF 53%; c.) TTE 01/08/2019: EF 45%; d.) MV 01/08/2019: EF 45-50%; e.) TTE 11/26/2020: EF 45%; f.) MV 11/26/2020: EF 51%; g.) TTE 03/06/2023: EF 40%   CKD (chronic kidney disease), stage III (HCC)    CVA (cerebral vascular accident) (HCC)    a.) noted on CT head 03/12/2017: RIGHT posterior  parietal encephalomalacia consistent with small chronic temporoparietal infarct   DDD (degenerative disc disease), cervical    Dyspnea on exertion    GERD (gastroesophageal reflux disease)    Gout    Heart murmur    HFrEF (heart failure with reduced ejection fraction) (HCC)    a.) TTE 07/03/2015: EF 50%, mild-mod LVH, inf/post HK, G1DD, triv MR/PR, mild AR/TR, RVSP 36.8; b.) TTE 01/08/2019: EF 45%, sep/apical HK, mild  LVH, mild LAE, mild panval regurg, RVSP 37.2; d.) TTE 11/26/2020: EF 45%, apical.inf/post HK, G1DD, mild LAE/LVE, triv AR/PR, mild MR/TR, RVSP 52.9; e.)  TTE 03/06/2023: EF 40%, sep/apical/inf HK, sev LAE, mild AR/TR/PR, mod MR, RVSP 35.3   Hiatal hernia    High cholesterol    Hypertension    IDA (iron deficiency anemia)    Left renal mass 12/13/2022   a.) US renal 12/13/2022: 2.7 x 2.4 x 2.5 cm; b.) MRI abdomen 12/22/2022: 2.9 x 2.6 x 2.4 cm LEFT anterior upper kidney; c.) MRI abdomen 06/14/2023: interval increase in size to 2.8 x 3.2 cm --> 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   Long term current use of aspirin    OSA on CPAP    Primary hypogonadism in male    Stenosis of left vertebral artery (severe)    T2DM (type 2 diabetes mellitus) (HCC)     Past Surgical History:  Procedure Laterality Date   CATARACT EXTRACTION W/ INTRAOCULAR LENS IMPLANT Right 2013   COLONOSCOPY     COLONOSCOPY N/A 04/06/2022   Procedure: COLONOSCOPY;  Surgeon: Toledo, Boykin Nearing, MD;  Location: [facility] ENDOSCOPY;  Service: Gastroenterology;  Laterality: N/A;   COLONOSCOPY WITH PROPOFOL N/A 08/03/2015   Procedure: COLONOSCOPY WITH PROPOFOL;  Surgeon: Scot Jun, MD;  Location: [facility] ENDOSCOPY;  Service: Endoscopy;  Laterality: N/A;   CORONARY STENT INTERVENTION N/A 12/31/2020   Procedure: CORONARY STENT INTERVENTION;  Surgeon: Alwyn Pea, MD;  Location:  [facility] INVASIVE CV LAB;  Service: Cardiovascular;  Laterality: N/A;  RCA   EUS N/A 09/03/2015   Procedure: LOWER ENDOSCOPIC ULTRASOUND (EUS);  Surgeon: Bearl Mulberry, MD;  Location: [facility] ENDOSCOPY;  Service: Endoscopy;  Laterality: N/A;   IR RADIOLOGIST EVAL & MGMT  08/24/2023   IR RADIOLOGIST EVAL & MGMT  09/28/2023   LEFT HEART CATH AND CORONARY ANGIOGRAPHY N/A 12/31/2020   Procedure: LEFT HEART CATH AND CORONARY ANGIOGRAPHY;  Surgeon: Alwyn Pea, MD;  Location: [facility] INVASIVE CV LAB;  Service: Cardiovascular;  Laterality: N/A;    REVERSE SHOULDER ARTHROPLASTY Right 12/12/2017   Procedure: REVERSE SHOULDER ARTHROPLASTY;  Surgeon: Christena Flake, MD;  Location: [facility] ORS;  Service: Orthopedics;  Laterality: Right;   TONSILLECTOMY     TOTAL KNEE ARTHROPLASTY Right 05/12/2016   Procedure: TOTAL KNEE ARTHROPLASTY;  Surgeon: Christena Flake, MD;  Location: [facility] ORS;  Service: Orthopedics;  Laterality: Right;    Allergies: Patient has no known allergies.  Medications: Prior to Admission medications   Medication Sig Start Date End Date Taking? Authorizing Provider  allopurinol (ZYLOPRIM) 300 MG tablet Take 300 mg by mouth daily.    [provider]  amLODipine (NORVASC) 10 MG tablet Take 1 tablet (10 mg total) by mouth daily. 01/02/21   Callwood, Gerda Diss D, MD  aspirin EC 81 MG tablet Take 81 mg by mouth daily. Swallow whole.    [provider]  colchicine 0.6 MG tablet Take 0.6 mg by mouth daily as needed. For gout flare-ups    [provider]  furosemide (LASIX) 20 MG tablet Take 20 mg by mouth daily. 07/03/23   [provider]  hydrALAZINE (APRESOLINE) 25 MG tablet Take 1 tablet (25 mg total) by mouth 3 (three) times daily. 01/01/21   Callwood, Dwayne D, MD  hydrochlorothiazide (HYDRODIURIL) 25 MG tablet Take 1 tablet (25 mg total) by mouth daily. 01/02/21   Callwood, Dwayne D, MD  metFORMIN (GLUCOPHAGE) 500 MG tablet Take 500 mg by mouth 2 (two) times daily.     [provider]  metoprolol tartrate (LOPRESSOR) 25 MG tablet Take 25 mg by mouth 2 (two) times daily.    [provider]  olmesartan (BENICAR) 40 MG tablet Take 40 mg by mouth daily. 12/07/20   [provider]  potassium chloride SA (K-DUR,KLOR-CON) 20 MEQ tablet Take 20 mEq by mouth 3 (three) times daily.     [provider]  pravastatin (PRAVACHOL) 20 MG tablet Take 20 mg by mouth daily. Patient not taking: Reported on 09/07/2023 05/25/23   [provider]  rosuvastatin (CRESTOR) 20 MG tablet Take  1 tablet (20 mg total) by mouth daily. 01/01/21   Callwood, Dwayne D, MD  tamsulosin (FLOMAX) 0.4 MG CAPS capsule Take 0.4 mg by mouth daily. 09/17/20   [provider]     Family History  Problem Relation Age of Onset   Heart disease Mother    Stroke Father    Hypertension Father    Gout Father    Cancer Brother    Bone cancer Son    Brain cancer Son     Social History   Socioeconomic History   Marital status: Married    Spouse name: Not on file   Number of children: Not on file   Years of education: Not on file   Highest education level: Not on file  Occupational History   Occupation: works in cigarette factory  Tobacco Use   Smoking status: Former  Current packs/day: 0.00    Average packs/day: 1 pack/day for 30.0 years (30.0 ttl pk-yrs)    Types: Cigarettes    Start date: 09/28/1958    Quit date: 09/28/1988    Years since quitting: 35.0   Smokeless tobacco: Never   Tobacco comments:    quit 1990  Vaping Use   Vaping status: Never Used  Substance and Sexual Activity   Alcohol use: No   Drug use: No   Sexual activity: Yes  Other Topics Concern   Not on file  Social History Narrative   Not on file   Social Drivers of Health   Financial Resource Strain: Medium Risk (03/29/2023)   Received from [facility]   Overall Financial Resource Strain (CARDIA)    Difficulty of Paying Living Expenses: Somewhat hard  Food Insecurity: No Food Insecurity (03/29/2023)   Received from [facility]   Hunger Vital Sign    Worried About Running Out of Food in the Last Year: Never true    Ran Out of Food in the Last Year: Never true  Transportation Needs: No Transportation Needs (03/29/2023)   Received from [facility] - Transportation    In the past 12 months, has lack of transportation kept you from medical appointments or from getting medications?: No    Lack of Transportation (Non-Medical): No  Physical  Activity: Not on file  Stress: Not on file  Social Connections: Not on file    ECOG Status: 0 - Asymptomatic  Review of Systems  Review of Systems: A 12 point ROS discussed and pertinent positives are indicated in the HPI above.  All other systems are negative.  Advance Care Plan: The advanced care plan/surrogate decision maker was discussed at the time of visit and the patient did not wish to discuss or was not able to name a surrogate decision maker or provide an advance care plan.    Physical Exam No direct physical exam was performed (except for noted visual exam findings with Video Visits).    Vital Signs: There were no vitals taken for this visit.  Imaging: IR Radiologist Eval & Mgmt Result Date: 09/28/2023 EXAM: NEW PATIENT OFFICE VISIT CHIEF COMPLAINT: SEE NOTE IN EPIC HISTORY OF PRESENT ILLNESS: SEE NOTE IN EPIC REVIEW OF SYSTEMS: SEE NOTE IN EPIC PHYSICAL EXAMINATION: SEE NOTE IN EPIC ASSESSMENT AND PLAN: SEE NOTE IN EPIC Electronically Signed   By: Malachy Moan M.D.   On: 09/28/2023 13:35   CT RENAL TUMOR ABLATION UNILATERAL Result Date: 09/11/2023 INDICATION: 73 year old male with an enlarging enhancing lesion exophytic from the anterior upper pole of the left kidney. Findings are highly concerning for renal cell carcinoma. He presents for CT-guided biopsy and concurrent microwave ablation. EXAM: 1. CT-guided biopsy 2. CT-guided percutaneous thermal ablation COMPARISON:  None Available. MEDICATIONS: None. ANESTHESIA/SEDATION: General - as administered by the Anesthesia department FLUOROSCOPY: None. COMPLICATIONS: None immediate. TECHNIQUE: Informed written consent was obtained from the patient after a thorough discussion of the procedural risks, benefits and alternatives. All questions were addressed. Maximal Sterile Barrier Technique was utilized including caps, mask, sterile gowns, sterile gloves, sterile drape, hand hygiene and skin antiseptic. A timeout was performed  prior to the initiation of the procedure. A planning axial CT scan was performed. The lesion in the left kidney was successfully identified. The pancreas and splenic flexure of the colon were also noted. Suitable skin entry sites were selected and marked. The skin was then sterilely prepped  and draped in the standard fashion using chlorhexidine skin prep. Initially, 22 gauge spinal needles were used to assess the skin entry sites an appropriate trajectories. Once planning was finalized, dermatotomy is were made at the skin entry site. An 18 gauge 10 cm trocar needle was carefully advanced into the perinephric fat superior to the lesion between the lesion in the pancreas. At the same time, 2 separate 15 cm NeuWave PR microwave probes were advanced and used to puncture the lesion in parallel fashion. Hydro dissection was then performed through the trocar needle. Additional axial CT imaging was performed confirming microwave antenna placement as well as assessing the success of the hydro dissection. The pancreas and splenic flexure of the colon were adequately displaced. There is a fluid barrier between the antennae and the pancreas. At this time the decision was made to proceed with biopsy. A now other small dermatotomy was made. A 17 gauge introducer needle was advanced and positioned at the margin of the mass. A single 18 gauge core biopsy was then obtained coaxially using the bio Pince automated biopsy device. Biopsy specimens were placed in formalin and delivered to pathology for further analysis. Microwave ablation was then performed. Both probes were powered at 65 watts in the ablation was maintained for 5 minutes. Imaging at 3 minutes into the ablation demonstrates an excellent ablation zone with expected desiccation of the treated lesion. Following ablation, the probes were removed and discarded. The hydro dissection trocar was removed and discarded. Post ablation and biopsy CT imaging demonstrates no evidence  of immediate complication. FINDINGS: Successful hydro dissection. IMPRESSION: 1. Successful CT-guided core biopsy of left renal neoplasm. 2. Successful CT-guided percutaneous thermal ablation of left renal neoplasm. Electronically Signed   By: Malachy Moan M.D.   On: 09/11/2023 12:28   CT RENAL BIOPSY Result Date: 09/11/2023 INDICATION: 73 year old male with an enlarging enhancing lesion exophytic from the anterior upper pole of the left kidney. Findings are highly concerning for renal cell carcinoma. He presents for CT-guided biopsy and concurrent microwave ablation. EXAM: 1. CT-guided biopsy 2. CT-guided percutaneous thermal ablation COMPARISON:  None Available. MEDICATIONS: None. ANESTHESIA/SEDATION: General - as administered by the Anesthesia department FLUOROSCOPY: None. COMPLICATIONS: None immediate. TECHNIQUE: Informed written consent was obtained from the patient after a thorough discussion of the procedural risks, benefits and alternatives. All questions were addressed. Maximal Sterile Barrier Technique was utilized including caps, mask, sterile gowns, sterile gloves, sterile drape, hand hygiene and skin antiseptic. A timeout was performed prior to the initiation of the procedure. A planning axial CT scan was performed. The lesion in the left kidney was successfully identified. The pancreas and splenic flexure of the colon were also noted. Suitable skin entry sites were selected and marked. The skin was then sterilely prepped and draped in the standard fashion using chlorhexidine skin prep. Initially, 22 gauge spinal needles were used to assess the skin entry sites an appropriate trajectories. Once planning was finalized, dermatotomy is were made at the skin entry site. An 18 gauge 10 cm trocar needle was carefully advanced into the perinephric fat superior to the lesion between the lesion in the pancreas. At the same time, 2 separate 15 cm NeuWave PR microwave probes were advanced and used to  puncture the lesion in parallel fashion. Hydro dissection was then performed through the trocar needle. Additional axial CT imaging was performed confirming microwave antenna placement as well as assessing the success of the hydro dissection. The pancreas and splenic flexure of the colon were adequately  displaced. There is a fluid barrier between the antennae and the pancreas. At this time the decision was made to proceed with biopsy. A now other small dermatotomy was made. A 17 gauge introducer needle was advanced and positioned at the margin of the mass. A single 18 gauge core biopsy was then obtained coaxially using the bio Pince automated biopsy device. Biopsy specimens were placed in formalin and delivered to pathology for further analysis. Microwave ablation was then performed. Both probes were powered at 65 watts in the ablation was maintained for 5 minutes. Imaging at 3 minutes into the ablation demonstrates an excellent ablation zone with expected desiccation of the treated lesion. Following ablation, the probes were removed and discarded. The hydro dissection trocar was removed and discarded. Post ablation and biopsy CT imaging demonstrates no evidence of immediate complication. FINDINGS: Successful hydro dissection. IMPRESSION: 1. Successful CT-guided core biopsy of left renal neoplasm. 2. Successful CT-guided percutaneous thermal ablation of left renal neoplasm. Electronically Signed   By: Malachy Moan M.D.   On: 09/11/2023 12:28    Labs:  CBC: Recent Labs    05/09/23 1158 08/07/23 1307 09/11/23 0811  WBC 9.3 9.2 7.3  HGB 9.5* 10.5* 9.6*  HCT 31.3* 34.4* 30.4*  PLT 420* 371 329    COAGS: Recent Labs    09/11/23 0811  INR 1.0    BMP: Recent Labs    08/07/23 1307 09/11/23 0811  NA 139 139  K 3.1* 3.3*  CL 98 99  CO2 29 27  GLUCOSE 96 123*  BUN 38* 43*  CALCIUM 9.5 9.4  CREATININE 2.31* 2.40*  GFRNONAA 29* 28*    LIVER FUNCTION TESTS: Recent Labs     09/11/23 0811  BILITOT 0.5  AST 12*  ALT 9  ALKPHOS 62  PROT 6.3*  ALBUMIN 3.8    TUMOR MARKERS: No results for input(s): "AFPTM", "CEA", "CA199", "CHROMGRNA" in the last 8760 hours.  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  He is now 2 weeks status post biopsy and percutaneous thermal ablation.  He is doing exceptionally well and has recovered completely.  His postoperative course was uncomplicated.  I reviewed his biopsy diagnosis with both him and his wife.  He feels good and is having no issues.  We will continue routine surveillance imaging.  1.)  MRI of the abdomen with gadolinium contrast in 6 months followed by clinic visit.    Electronically Signed: Sterling Big 09/28/2023, 1:45 PM   I spent a total of  15 Minutes in remote  clinical consultation, greater than 50% of which was counseling/coordinating care for left renal cell carcinoma.    Visit type: Audio only (telephone). Audio (no video) only due to patient preference. Alternative for in-person consultation at [facility], [address] [place], [place], [place]. This visit type was conducted due to national recommendations for restrictions regarding the COVID-19 Pandemic (e.g. social distancing).  This format is felt to be most appropriate for this patient at this time.  All issues noted in this document were discussed and addressed.

## 2023-12-06 ENCOUNTER — Inpatient Hospital Stay: Payer: Medicare HMO

## 2023-12-06 ENCOUNTER — Inpatient Hospital Stay: Payer: Medicare HMO | Admitting: Internal Medicine

## 2023-12-18 ENCOUNTER — Inpatient Hospital Stay: Attending: Internal Medicine

## 2023-12-18 ENCOUNTER — Encounter: Payer: Self-pay | Admitting: Internal Medicine

## 2023-12-18 ENCOUNTER — Inpatient Hospital Stay: Admitting: Internal Medicine

## 2023-12-18 ENCOUNTER — Inpatient Hospital Stay

## 2023-12-18 VITALS — BP 158/76 | HR 71 | Temp 97.1°F | Resp 17 | Wt 202.2 lb

## 2023-12-18 DIAGNOSIS — Z808 Family history of malignant neoplasm of other organs or systems: Secondary | ICD-10-CM | POA: Insufficient documentation

## 2023-12-18 DIAGNOSIS — Z87891 Personal history of nicotine dependence: Secondary | ICD-10-CM | POA: Insufficient documentation

## 2023-12-18 DIAGNOSIS — N183 Chronic kidney disease, stage 3 unspecified: Secondary | ICD-10-CM | POA: Diagnosis not present

## 2023-12-18 DIAGNOSIS — D649 Anemia, unspecified: Secondary | ICD-10-CM | POA: Diagnosis not present

## 2023-12-18 DIAGNOSIS — D631 Anemia in chronic kidney disease: Secondary | ICD-10-CM | POA: Diagnosis not present

## 2023-12-18 DIAGNOSIS — I129 Hypertensive chronic kidney disease with stage 1 through stage 4 chronic kidney disease, or unspecified chronic kidney disease: Secondary | ICD-10-CM | POA: Insufficient documentation

## 2023-12-18 DIAGNOSIS — C642 Malignant neoplasm of left kidney, except renal pelvis: Secondary | ICD-10-CM | POA: Insufficient documentation

## 2023-12-18 DIAGNOSIS — D509 Iron deficiency anemia, unspecified: Secondary | ICD-10-CM | POA: Diagnosis present

## 2023-12-18 LAB — IRON AND TIBC
Iron: 45 ug/dL (ref 45–182)
Saturation Ratios: 13 % — ABNORMAL LOW (ref 17.9–39.5)
TIBC: 356 ug/dL (ref 250–450)
UIBC: 311 ug/dL

## 2023-12-18 LAB — CBC WITH DIFFERENTIAL (CANCER CENTER ONLY)
Abs Immature Granulocytes: 0.11 10*3/uL — ABNORMAL HIGH (ref 0.00–0.07)
Basophils Absolute: 0.1 10*3/uL (ref 0.0–0.1)
Basophils Relative: 1 %
Eosinophils Absolute: 0.3 10*3/uL (ref 0.0–0.5)
Eosinophils Relative: 3 %
HCT: 31.9 % — ABNORMAL LOW (ref 39.0–52.0)
Hemoglobin: 9.9 g/dL — ABNORMAL LOW (ref 13.0–17.0)
Immature Granulocytes: 1 %
Lymphocytes Relative: 21 %
Lymphs Abs: 1.7 10*3/uL (ref 0.7–4.0)
MCH: 26.3 pg (ref 26.0–34.0)
MCHC: 31 g/dL (ref 30.0–36.0)
MCV: 84.8 fL (ref 80.0–100.0)
Monocytes Absolute: 0.9 10*3/uL (ref 0.1–1.0)
Monocytes Relative: 12 %
Neutro Abs: 5 10*3/uL (ref 1.7–7.7)
Neutrophils Relative %: 62 %
Platelet Count: 305 10*3/uL (ref 150–400)
RBC: 3.76 MIL/uL — ABNORMAL LOW (ref 4.22–5.81)
RDW: 16.1 % — ABNORMAL HIGH (ref 11.5–15.5)
WBC Count: 8.1 10*3/uL (ref 4.0–10.5)
nRBC: 0 % (ref 0.0–0.2)

## 2023-12-18 LAB — BASIC METABOLIC PANEL - CANCER CENTER ONLY
Anion gap: 7 (ref 5–15)
BUN: 40 mg/dL — ABNORMAL HIGH (ref 8–23)
CO2: 30 mmol/L (ref 22–32)
Calcium: 8.9 mg/dL (ref 8.9–10.3)
Chloride: 103 mmol/L (ref 98–111)
Creatinine: 2.17 mg/dL — ABNORMAL HIGH (ref 0.61–1.24)
GFR, Estimated: 31 mL/min — ABNORMAL LOW (ref >60)
Glucose, Bld: 96 mg/dL (ref 70–99)
Potassium: 3.6 mmol/L (ref 3.5–5.1)
Sodium: 140 mmol/L (ref 135–145)

## 2023-12-18 LAB — FERRITIN: Ferritin: 27 ng/mL (ref 24–336)

## 2023-12-18 NOTE — Progress Notes (Signed)
 [facility] CONSULT NOTE  Patient Care Team: Monique Ano, MD as PCP - General (Family Medicine) Gwyn Leos, MD as Consulting Physician (Oncology)  CHIEF COMPLAINTS/PURPOSE OF CONSULTATION: ANEMIA   HEMATOLOGY HISTORY  # ANEMIA[Hb; MCV-platelets- WBC; Iron sat; ferritin;  GFR- CT/US ; EGD/colonoscopy-  # CKD- [Dr.]  HISTORY OF PRESENTING ILLNESS: Patient ambulating-independently.  Alone.  Eric Browning 73 y.o.  adult pleasant patient with CKD and symptomatic anemia-iron deficiency-CKD; Left RCC s/p ablation is here for follow-up.  Pt feels tired and cold. Appetite is alright given the circumstances.  Unfortunately patient's wife passed away earlier in the month.  Currently he 's denies pain. Denies any dizziness. No blood in stool.  He is compliant with his oral iron.   Review of Systems  Constitutional:  Positive for malaise/fatigue. Negative for chills, diaphoresis, fever and weight loss.  HENT:  Negative for nosebleeds and sore throat.   Eyes:  Negative for double vision.  Respiratory:  Negative for cough, hemoptysis, sputum production, shortness of breath and wheezing.   Cardiovascular:  Negative for chest pain, palpitations, orthopnea and leg swelling.  Gastrointestinal:  Negative for abdominal pain, blood in stool, constipation, diarrhea, heartburn, melena, nausea and vomiting.  Genitourinary:  Negative for dysuria, frequency and urgency.  Musculoskeletal:  Negative for back pain and joint pain.  Skin: Negative.  Negative for itching and rash.  Neurological:  Negative for dizziness, tingling, focal weakness, weakness and headaches.  Endo/Heme/Allergies:  Does not bruise/bleed easily.  Psychiatric/Behavioral:  Negative for depression. The patient is not nervous/anxious and does not have insomnia.      MEDICAL HISTORY:  Past Medical History:  Diagnosis Date   Angina at rest [facility])    Arthritis of both hands    Bilateral cataracts     Bilateral renal cysts    BPH (benign prostatic hyperplasia)    CAD (coronary artery disease)    a.) LHC/PCI 12/31/2020: 90% dRCA (3.0 x 15 mm Resolute Onyx DES); 50% pRCA, minor lum irregs in LAD and LCx   Cardiomegaly    Cardiomyopathy (HCC)    a.) TTE 07/03/2015: EF 50%; b.) MV 07/03/2015: EF 53%; c.) TTE 01/08/2019: EF 45%; d.) MV 01/08/2019: EF 45-50%; e.) TTE 11/26/2020: EF 45%; f.) MV 11/26/2020: EF 51%; g.) TTE 03/06/2023: EF 40%   CKD (chronic kidney disease), stage III (HCC)    CVA (cerebral vascular accident) (HCC)    a.) noted on CT head 03/12/2017: RIGHT posterior  parietal encephalomalacia consistent with small chronic temporoparietal infarct   DDD (degenerative disc disease), cervical    Dyspnea on exertion    GERD (gastroesophageal reflux disease)    Gout    Heart murmur    HFrEF (heart failure with reduced ejection fraction) (HCC)    a.) TTE 07/03/2015: EF 50%, mild-mod LVH, inf/post HK, G1DD, triv MR/PR, mild AR/TR, RVSP 36.8; b.) TTE 01/08/2019: EF 45%, sep/apical HK, mild LVH, mild LAE, mild panval regurg, RVSP 37.2; d.) TTE 11/26/2020: EF 45%, apical.inf/post HK, G1DD, mild LAE/LVE, triv AR/PR, mild MR/TR, RVSP 52.9; e.)  TTE 03/06/2023: EF 40%, sep/apical/inf HK, sev LAE, mild AR/TR/PR, mod MR, RVSP 35.3   Hiatal hernia    High cholesterol    Hypertension    IDA (iron deficiency anemia)    Left renal mass 12/13/2022   a.) US  renal 12/13/2022: 2.7 x 2.4 x 2.5 cm; b.) MRI abdomen 12/22/2022: 2.9 x 2.6 x 2.4 cm LEFT anterior upper kidney; c.) MRI abdomen 06/14/2023: interval increase in size to  2.8 x 3.2 cm --> 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   Long term current use of aspirin     OSA on CPAP    Primary hypogonadism in male    Stenosis of left vertebral artery (severe)    T2DM (type 2 diabetes mellitus) (HCC)     SURGICAL HISTORY: Past Surgical History:  Procedure Laterality Date   CATARACT EXTRACTION W/ INTRAOCULAR LENS IMPLANT Right 2013   COLONOSCOPY      COLONOSCOPY N/A 04/06/2022   Procedure: COLONOSCOPY;  Surgeon: Toledo, Alphonsus Jeans, MD;  Location: [facility] ENDOSCOPY;  Service: Gastroenterology;  Laterality: N/A;   COLONOSCOPY WITH PROPOFOL  N/A 08/03/2015   Procedure: COLONOSCOPY WITH PROPOFOL ;  Surgeon: Cassie Click, MD;  Location: [facility] ENDOSCOPY;  Service: Endoscopy;  Laterality: N/A;   CORONARY STENT INTERVENTION N/A 12/31/2020   Procedure: CORONARY STENT INTERVENTION;  Surgeon: Antonette Batters, MD;  Location: [facility] INVASIVE CV LAB;  Service: Cardiovascular;  Laterality: N/A;  RCA   EUS N/A 09/03/2015   Procedure: LOWER ENDOSCOPIC ULTRASOUND (EUS);  Surgeon: Eloisa Hait, MD;  Location: [facility] ENDOSCOPY;  Service: Endoscopy;  Laterality: N/A;   IR RADIOLOGIST EVAL & MGMT  08/24/2023   IR RADIOLOGIST EVAL & MGMT  09/28/2023   LEFT HEART CATH AND CORONARY ANGIOGRAPHY N/A 12/31/2020   Procedure: LEFT HEART CATH AND CORONARY ANGIOGRAPHY;  Surgeon: Antonette Batters, MD;  Location: [facility] INVASIVE CV LAB;  Service: Cardiovascular;  Laterality: N/A;   REVERSE SHOULDER ARTHROPLASTY Right 12/12/2017   Procedure: REVERSE SHOULDER ARTHROPLASTY;  Surgeon: Elner Hahn, MD;  Location: [facility] ORS;  Service: Orthopedics;  Laterality: Right;   TONSILLECTOMY     TOTAL KNEE ARTHROPLASTY Right 05/12/2016   Procedure: TOTAL KNEE ARTHROPLASTY;  Surgeon: Elner Hahn, MD;  Location: [facility] ORS;  Service: Orthopedics;  Laterality: Right;    SOCIAL HISTORY: Social History   Socioeconomic History   Marital status: Married    Spouse name: Not on file   Number of children: Not on file   Years of education: Not on file   Highest education level: Not on file  Occupational History   Occupation: works in cigarette factory  Tobacco Use   Smoking status: Former    Current packs/day: 0.00    Average packs/day: 1 pack/day for 30.0 years (30.0 ttl pk-yrs)    Types: Cigarettes    Start date: 09/28/1958    Quit date: 09/28/1988    Years since quitting: 35.2    Smokeless tobacco: Never   Tobacco comments:    quit 1990  Vaping Use   Vaping status: Never Used  Substance and Sexual Activity   Alcohol use: No   Drug use: No   Sexual activity: Yes  Other Topics Concern   Not on file  Social History Narrative   Not on file   Social Drivers of Health   Financial Resource Strain: Medium Risk (03/29/2023)   Received from [facility]   Overall Financial Resource Strain (CARDIA)    Difficulty of Paying Living Expenses: Somewhat hard  Food Insecurity: No Food Insecurity (03/29/2023)   [facility] from [facility]   Hunger Vital Sign    Worried About Running Out of Food in the Last Year: Never true    Ran Out of Food in the Last Year: Never true  Transportation Needs: No Transportation Needs (03/29/2023)   Received from [facility]   PRAPARE - Transportation    In the past 12 months, has lack  of transportation kept you from medical appointments or from getting medications?: No    Lack of Transportation (Non-Medical): No  Physical Activity: Not on file  Stress: Not on file  Social Connections: Not on file  Intimate Partner Violence: Not on file    FAMILY HISTORY: Family History  Problem Relation Age of Onset   Heart disease Mother    Stroke Father    Hypertension Father    Gout Father    Cancer Brother    Bone cancer Son    Brain cancer Son     ALLERGIES:  has no known allergies.  MEDICATIONS:  Current Outpatient Medications  Medication Sig Dispense Refill   allopurinol  (ZYLOPRIM ) 300 MG tablet Take 300 mg by mouth daily.     amLODipine  (NORVASC ) 10 MG tablet Take 1 tablet (10 mg total) by mouth daily. 30 tablet 6   aspirin  EC 81 MG tablet Take 81 mg by mouth daily. Swallow whole.     furosemide (LASIX) 20 MG tablet Take 20 mg by mouth daily.     hydrALAZINE  (APRESOLINE ) 25 MG tablet Take 1 tablet (25 mg total) by mouth 3 (three) times daily. 90 tablet 6   hydrochlorothiazide   (HYDRODIURIL ) 25 MG tablet Take 1 tablet (25 mg total) by mouth daily. 30 tablet 6   metFORMIN  (GLUCOPHAGE ) 500 MG tablet Take 500 mg by mouth 2 (two) times daily.      metoprolol  tartrate (LOPRESSOR ) 25 MG tablet Take 25 mg by mouth 2 (two) times daily.     olmesartan  (BENICAR ) 40 MG tablet Take 40 mg by mouth daily.     potassium chloride  SA (K-DUR,KLOR-CON ) 20 MEQ tablet Take 20 mEq by mouth 3 (three) times daily.      pravastatin  (PRAVACHOL ) 20 MG tablet Take 20 mg by mouth daily.     rosuvastatin  (CRESTOR ) 20 MG tablet Take 1 tablet (20 mg total) by mouth daily. 60 tablet 6   tamsulosin (FLOMAX) 0.4 MG CAPS capsule Take 0.4 mg by mouth daily.     colchicine  0.6 MG tablet Take 0.6 mg by mouth daily as needed. For gout flare-ups (Patient not taking: Reported on 12/18/2023)     No current facility-administered medications for this visit.     PHYSICAL EXAMINATION:   Vitals:   12/18/23 1346 12/18/23 1402  BP: (!) 158/76   Pulse: 71   Resp:  17  Temp: (!) 97.1 F (36.2 C)    Filed Weights   12/18/23 1402  Weight: 202 lb 3.2 oz (91.7 kg)    Physical Exam Vitals and nursing note reviewed.  HENT:     Head: Normocephalic and atraumatic.     Mouth/Throat:     Pharynx: Oropharynx is clear.  Eyes:     Extraocular Movements: Extraocular movements intact.     Pupils: Pupils are equal, round, and reactive to light.  Cardiovascular:     Rate and Rhythm: Normal rate and regular rhythm.     Heart sounds: Murmur heard.  Pulmonary:     Comments: Decreased breath sounds bilaterally.  Abdominal:     Palpations: Abdomen is soft.  Musculoskeletal:        General: Normal range of motion.     Cervical back: Normal range of motion.  Skin:    General: Skin is warm.  Neurological:     General: No focal deficit present.     Mental Status: He is alert and oriented to person, place, and time.  Psychiatric:  Behavior: Behavior normal.        Judgment: Judgment normal.       LABORATORY DATA:  I have reviewed the data as listed Lab Results  Component Value Date   WBC 8.1 12/18/2023   HGB 9.9 (L) 12/18/2023   HCT 31.9 (L) 12/18/2023   MCV 84.8 12/18/2023   PLT 305 12/18/2023   Recent Labs    08/07/23 1307 09/11/23 0811 12/18/23 1332  NA 139 139 140  K 3.1* 3.3* 3.6  CL 98 99 103  CO2 29 27 30   GLUCOSE 96 123* 96  BUN 38* 43* 40*  CREATININE 2.31* 2.40* 2.17*  CALCIUM  9.5 9.4 8.9  GFRNONAA 29* 28* 31*  PROT  --  6.3*  --   ALBUMIN  --  3.8  --   AST  --  12*  --   ALT  --  9  --   ALKPHOS  --  62  --   BILITOT  --  0.5  --      No results found.  ASSESSMENT & PLAN:   Symptomatic anemia # Chronic anemia-[AUG 2024-Hb 8.8; MCV- 76] microcytic recently getting worse.  Interestingly patient is ONLY mildly symptomatic. 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  # Today hemoglobin is 9.9- overa ll stable  Discussed regarding iron infusions however patient wants to hold off until next visit.  I think is reasonable.  # ETIOLOGY:[facility] AUG 2023- [[facility]]- recommend EGD/ further work up- however patient declines at this time.   # CKD stage III - IV [Dr.Korrapati]- M proetin; NEG; k/l= abnormal sec to CKD- stable.   # Incidental RCC left.  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 Defer to Urology re: ongoing surveillance.  #HTN- stable- - And also follow-up with nephrology closely.  # DISPOSITION: #  NO venofer  # follow up in 4 months- MD; labs- cbc/bmp; iron studies; ferritin-possible venofer  Dr.B  All questions were answered. The patient knows to call the clinic with any problems, questions or concerns.    Gwyn Leos, MD 12/18/2023 3:12 PM

## 2023-12-18 NOTE — Progress Notes (Signed)
No Venofer today per MD 

## 2023-12-18 NOTE — Progress Notes (Signed)
 Pt's wife passed away 11/29/23. Pt feels tired and cold. Appetite is alright given the circumstances. Denies pain. Denies any dizziness. No blood in stool.

## 2023-12-18 NOTE — Assessment & Plan Note (Addendum)
#   Chronic anemia-[AUG 2024-Hb 8.8; MCV- 76] microcytic recently getting worse.  Interestingly patient is ONLY mildly symptomatic. 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  # Today hemoglobin is 9.9- overa ll stable  Discussed regarding iron infusions however patient wants to hold off until next visit.  I think is reasonable.  # ETIOLOGY:[facility] AUG 2023- [[facility]]- recommend EGD/ further work up- however patient declines at this time.   # CKD stage III - IV [Dr.Korrapati]- M proetin; NEG; k/l= abnormal sec to CKD- stable.   # Incidental RCC left.  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 Defer to Urology re: ongoing surveillance.  #HTN- stable- - And also follow-up with nephrology closely.  # DISPOSITION: #  NO venofer  # follow up in 4 months- MD; labs- cbc/bmp; iron studies; ferritin-possible venofer  Dr.B

## 2024-04-17 ENCOUNTER — Other Ambulatory Visit: Payer: Self-pay | Admitting: Interventional Radiology

## 2024-04-17 DIAGNOSIS — N2889 Other specified disorders of kidney and ureter: Secondary | ICD-10-CM

## 2024-04-23 ENCOUNTER — Ambulatory Visit: Attending: Internal Medicine

## 2024-04-23 DIAGNOSIS — Z6830 Body mass index (BMI) 30.0-30.9, adult: Secondary | ICD-10-CM | POA: Insufficient documentation

## 2024-04-23 DIAGNOSIS — E669 Obesity, unspecified: Secondary | ICD-10-CM | POA: Diagnosis not present

## 2024-04-23 DIAGNOSIS — I10 Essential (primary) hypertension: Secondary | ICD-10-CM | POA: Diagnosis not present

## 2024-04-23 DIAGNOSIS — E119 Type 2 diabetes mellitus without complications: Secondary | ICD-10-CM | POA: Insufficient documentation

## 2024-04-23 DIAGNOSIS — G4733 Obstructive sleep apnea (adult) (pediatric): Secondary | ICD-10-CM | POA: Diagnosis present

## 2024-04-23 DIAGNOSIS — G4736 Sleep related hypoventilation in conditions classified elsewhere: Secondary | ICD-10-CM | POA: Insufficient documentation

## 2024-04-24 ENCOUNTER — Encounter: Payer: Self-pay | Admitting: Internal Medicine

## 2024-04-24 ENCOUNTER — Inpatient Hospital Stay: Admitting: Internal Medicine

## 2024-04-24 ENCOUNTER — Inpatient Hospital Stay

## 2024-04-24 ENCOUNTER — Inpatient Hospital Stay: Attending: Internal Medicine

## 2024-04-24 VITALS — BP 139/58 | HR 76 | Temp 98.6°F | Resp 20 | Ht 67.0 in | Wt 198.0 lb

## 2024-04-24 DIAGNOSIS — C642 Malignant neoplasm of left kidney, except renal pelvis: Secondary | ICD-10-CM | POA: Insufficient documentation

## 2024-04-24 DIAGNOSIS — I129 Hypertensive chronic kidney disease with stage 1 through stage 4 chronic kidney disease, or unspecified chronic kidney disease: Secondary | ICD-10-CM | POA: Insufficient documentation

## 2024-04-24 DIAGNOSIS — Z808 Family history of malignant neoplasm of other organs or systems: Secondary | ICD-10-CM | POA: Diagnosis not present

## 2024-04-24 DIAGNOSIS — Z87891 Personal history of nicotine dependence: Secondary | ICD-10-CM | POA: Insufficient documentation

## 2024-04-24 DIAGNOSIS — D649 Anemia, unspecified: Secondary | ICD-10-CM

## 2024-04-24 DIAGNOSIS — D509 Iron deficiency anemia, unspecified: Secondary | ICD-10-CM | POA: Insufficient documentation

## 2024-04-24 DIAGNOSIS — N184 Chronic kidney disease, stage 4 (severe): Secondary | ICD-10-CM | POA: Insufficient documentation

## 2024-04-24 LAB — CBC WITH DIFFERENTIAL (CANCER CENTER ONLY)
Abs Immature Granulocytes: 0.03 K/uL (ref 0.00–0.07)
Basophils Absolute: 0.1 K/uL (ref 0.0–0.1)
Basophils Relative: 1 %
Eosinophils Absolute: 0.2 K/uL (ref 0.0–0.5)
Eosinophils Relative: 2 %
HCT: 29.9 % — ABNORMAL LOW (ref 39.0–52.0)
Hemoglobin: 9.4 g/dL — ABNORMAL LOW (ref 13.0–17.0)
Immature Granulocytes: 0 %
Lymphocytes Relative: 16 %
Lymphs Abs: 1.3 K/uL (ref 0.7–4.0)
MCH: 26.6 pg (ref 26.0–34.0)
MCHC: 31.4 g/dL (ref 30.0–36.0)
MCV: 84.7 fL (ref 80.0–100.0)
Monocytes Absolute: 0.7 K/uL (ref 0.1–1.0)
Monocytes Relative: 9 %
Neutro Abs: 5.8 K/uL (ref 1.7–7.7)
Neutrophils Relative %: 72 %
Platelet Count: 308 K/uL (ref 150–400)
RBC: 3.53 MIL/uL — ABNORMAL LOW (ref 4.22–5.81)
RDW: 15.8 % — ABNORMAL HIGH (ref 11.5–15.5)
WBC Count: 8.1 K/uL (ref 4.0–10.5)
nRBC: 0 % (ref 0.0–0.2)

## 2024-04-24 LAB — CMP (CANCER CENTER ONLY)
ALT: 11 U/L (ref 0–44)
AST: 14 U/L — ABNORMAL LOW (ref 15–41)
Albumin: 3.7 g/dL (ref 3.5–5.0)
Alkaline Phosphatase: 64 U/L (ref 38–126)
Anion gap: 10 (ref 5–15)
BUN: 42 mg/dL — ABNORMAL HIGH (ref 8–23)
CO2: 26 mmol/L (ref 22–32)
Calcium: 8.7 mg/dL — ABNORMAL LOW (ref 8.9–10.3)
Chloride: 101 mmol/L (ref 98–111)
Creatinine: 3 mg/dL — ABNORMAL HIGH (ref 0.61–1.24)
GFR, Estimated: 21 mL/min — ABNORMAL LOW (ref >60)
Glucose, Bld: 109 mg/dL — ABNORMAL HIGH (ref 70–99)
Potassium: 3.2 mmol/L — ABNORMAL LOW (ref 3.5–5.1)
Sodium: 137 mmol/L (ref 135–145)
Total Bilirubin: 0.5 mg/dL (ref 0.0–1.2)
Total Protein: 6.3 g/dL — ABNORMAL LOW (ref 6.5–8.1)

## 2024-04-24 LAB — FERRITIN: Ferritin: 27 ng/mL (ref 24–336)

## 2024-04-24 LAB — IRON AND TIBC
Iron: 29 ug/dL — ABNORMAL LOW (ref 45–182)
Saturation Ratios: 9 % — ABNORMAL LOW (ref 17.9–39.5)
TIBC: 323 ug/dL (ref 250–450)
UIBC: 294 ug/dL

## 2024-04-24 NOTE — Progress Notes (Signed)
 Fatigue/weakness: NO Dyspena: YES Light headedness: NO  Blood in stool: NO  Swelling in ankles x2 weeks.

## 2024-04-24 NOTE — Progress Notes (Signed)
  [facility] CONSULT NOTE  Patient Care Team: Alla Amis, MD as PCP - General (Family Medicine) Rennie Cindy SAUNDERS, MD as Consulting Physician (Oncology)  CHIEF COMPLAINTS/PURPOSE OF CONSULTATION: ANEMIA   HEMATOLOGY HISTORY  # ANEMIA[Hb; MCV-platelets- WBC; Iron sat; ferritin;  GFR- CT/US ; EGD/colonoscopy-  # CKD- [Dr.]  HISTORY OF PRESENTING ILLNESS: Patient ambulating-independently.  Alone.  Eric Browning 73 y.o.  adult pleasant patient with CKD and symptomatic anemia-iron deficiency-CKD; Left RCC s/p ablation is here for follow-up.  Pt feels tired and cold. Appetite is alright given the circumstances.  Unfortunately patient's wife passed away earlier in the month.  Currently he 's denies pain. Denies any dizziness. No blood in stool.  He is compliant with his oral iron.   Review of Systems  Constitutional:  Positive for malaise/fatigue. Negative for chills, diaphoresis, fever and weight loss.  HENT:  Negative for nosebleeds and sore throat.   Eyes:  Negative for double vision.  Respiratory:  Negative for cough, hemoptysis, sputum production, shortness of breath and wheezing.   Cardiovascular:  Negative for chest pain, palpitations, orthopnea and leg swelling.  Gastrointestinal:  Negative for abdominal pain, blood in stool, constipation, diarrhea, heartburn, melena, nausea and vomiting.  Genitourinary:  Negative for dysuria, frequency and urgency.  Musculoskeletal:  Negative for back pain and joint pain.  Skin: Negative.  Negative for itching and rash.  Neurological:  Negative for dizziness, tingling, focal weakness, weakness and headaches.  Endo/Heme/Allergies:  Does not bruise/bleed easily.  Psychiatric/Behavioral:  Negative for depression. The patient is not nervous/anxious and does not have insomnia.      MEDICAL HISTORY:  Past Medical History:  Diagnosis Date   Angina at rest [facility])    Arthritis of both hands    Bilateral cataracts     Bilateral renal cysts    BPH (benign prostatic hyperplasia)    CAD (coronary artery disease)    a.) LHC/PCI 12/31/2020: 90% dRCA (3.0 x 15 mm Resolute Onyx DES); 50% pRCA, minor lum irregs in LAD and LCx   Cardiomegaly    Cardiomyopathy (HCC)    a.) TTE 07/03/2015: EF 50%; b.) MV 07/03/2015: EF 53%; c.) TTE 01/08/2019: EF 45%; d.) MV 01/08/2019: EF 45-50%; e.) TTE 11/26/2020: EF 45%; f.) MV 11/26/2020: EF 51%; g.) TTE 03/06/2023: EF 40%   CKD (chronic kidney disease), stage III (HCC)    CVA (cerebral vascular accident) (HCC)    a.) noted on CT head 03/12/2017: RIGHT posterior  parietal encephalomalacia consistent with small chronic temporoparietal infarct   DDD (degenerative disc disease), cervical    Dyspnea on exertion    GERD (gastroesophageal reflux disease)    Gout    Heart murmur    HFrEF (heart failure with reduced ejection fraction) (HCC)    a.) TTE 07/03/2015: EF 50%, mild-mod LVH, inf/post HK, G1DD, triv MR/PR, mild AR/TR, RVSP 36.8; b.) TTE 01/08/2019: EF 45%, sep/apical HK, mild LVH, mild LAE, mild panval regurg, RVSP 37.2; d.) TTE 11/26/2020: EF 45%, apical.inf/post HK, G1DD, mild LAE/LVE, triv AR/PR, mild MR/TR, RVSP 52.9; e.)  TTE 03/06/2023: EF 40%, sep/apical/inf HK, sev LAE, mild AR/TR/PR, mod MR, RVSP 35.3   Hiatal hernia    High cholesterol    Hypertension    IDA (iron deficiency anemia)    Left renal mass 12/13/2022   a.) US  renal 12/13/2022: 2.7 x 2.4 x 2.5 cm; b.) MRI abdomen 12/22/2022: 2.9 x 2.6 x 2.4 cm LEFT anterior upper kidney; c.) MRI abdomen 06/14/2023: interval increase in size to  2.8 x 3.2 cm --> 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   Long term current use of aspirin     OSA on CPAP    Primary hypogonadism in male    Stenosis of left vertebral artery (severe)    T2DM (type 2 diabetes mellitus) (HCC)     SURGICAL HISTORY: Past Surgical History:  Procedure Laterality Date   CATARACT EXTRACTION W/ INTRAOCULAR LENS IMPLANT Right 2013   COLONOSCOPY      COLONOSCOPY N/A 04/06/2022   Procedure: COLONOSCOPY;  Surgeon: Toledo, Ladell POUR, MD;  Location: [facility] ENDOSCOPY;  Service: Gastroenterology;  Laterality: N/A;   COLONOSCOPY WITH PROPOFOL  N/A 08/03/2015   Procedure: COLONOSCOPY WITH PROPOFOL ;  Surgeon: Lamar ONEIDA Holmes, MD;  Location: [facility] ENDOSCOPY;  Service: Endoscopy;  Laterality: N/A;   CORONARY STENT INTERVENTION N/A 12/31/2020   Procedure: CORONARY STENT INTERVENTION;  Surgeon: Florencio Cara BIRCH, MD;  Location: [facility] INVASIVE CV LAB;  Service: Cardiovascular;  Laterality: N/A;  RCA   EUS N/A 09/03/2015   Procedure: LOWER ENDOSCOPIC ULTRASOUND (EUS);  Surgeon: Asberry DELENA Coffee, MD;  Location: [facility] ENDOSCOPY;  Service: Endoscopy;  Laterality: N/A;   IR RADIOLOGIST EVAL & MGMT  08/24/2023   IR RADIOLOGIST EVAL & MGMT  09/28/2023   LEFT HEART CATH AND CORONARY ANGIOGRAPHY N/A 12/31/2020   Procedure: LEFT HEART CATH AND CORONARY ANGIOGRAPHY;  Surgeon: Florencio Cara BIRCH, MD;  Location: [facility] INVASIVE CV LAB;  Service: Cardiovascular;  Laterality: N/A;   REVERSE SHOULDER ARTHROPLASTY Right 12/12/2017   Procedure: REVERSE SHOULDER ARTHROPLASTY;  Surgeon: Edie Norleen PARAS, MD;  Location: [facility] ORS;  Service: Orthopedics;  Laterality: Right;   TONSILLECTOMY     TOTAL KNEE ARTHROPLASTY Right 05/12/2016   Procedure: TOTAL KNEE ARTHROPLASTY;  Surgeon: Norleen PARAS Edie, MD;  Location: [facility] ORS;  Service: Orthopedics;  Laterality: Right;    SOCIAL HISTORY: Social History   Socioeconomic History   Marital status: Married    Spouse name: Not on file   Number of children: Not on file   Years of education: Not on file   Highest education level: Not on file  Occupational History   Occupation: works in cigarette factory  Tobacco Use   Smoking status: Former    Current packs/day: 0.00    Average packs/day: 1 pack/day for 30.0 years (30.0 ttl pk-yrs)    Types: Cigarettes    Start date: 09/28/1958    Quit date: 09/28/1988    Years since quitting: 35.5    Smokeless tobacco: Never   Tobacco comments:    quit 1990  Vaping Use   Vaping status: Never Used  Substance and Sexual Activity   Alcohol use: No   Drug use: No   Sexual activity: Yes  Other Topics Concern   Not on file  Social History Narrative   Not on file   Social Drivers of Health   Financial Resource Strain: Medium Risk (03/29/2023)   Received from [facility]   Overall Financial Resource Strain (CARDIA)    Difficulty of Paying Living Expenses: Somewhat hard  Food Insecurity: No Food Insecurity (03/29/2023)   Received from [facility]   Hunger Vital Sign    Within the past 12 months, you worried that your food would run out before you got the money to buy more.: Never true    Within the past 12 months, the food you bought just didn't last and you didn't have money to get more.: Never true  Transportation Needs: No Transportation Needs (03/29/2023)  Received from [facility] - Transportation    In the past 12 months, has lack of transportation kept you from medical appointments or from getting medications?: No    Lack of Transportation (Non-Medical): No  Physical Activity: Not on file  Stress: Not on file  Social Connections: Not on file  Intimate Partner Violence: Not on file    FAMILY HISTORY: Family History  Problem Relation Age of Onset   Heart disease Mother    Stroke Father    Hypertension Father    Gout Father    Cancer Brother    Bone cancer Son    Brain cancer Son     ALLERGIES:  has no known allergies.  MEDICATIONS:  Current Outpatient Medications  Medication Sig Dispense Refill   allopurinol  (ZYLOPRIM ) 300 MG tablet Take 300 mg by mouth daily.     amLODipine  (NORVASC ) 10 MG tablet Take 1 tablet (10 mg total) by mouth daily. 30 tablet 6   aspirin  EC 81 MG tablet Take 81 mg by mouth daily. Swallow whole.     furosemide (LASIX) 20 MG tablet Take 20 mg by mouth daily.     hydrALAZINE   (APRESOLINE ) 25 MG tablet Take 1 tablet (25 mg total) by mouth 3 (three) times daily. 90 tablet 6   hydrochlorothiazide  (HYDRODIURIL ) 25 MG tablet Take 1 tablet (25 mg total) by mouth daily. 30 tablet 6   metFORMIN  (GLUCOPHAGE ) 500 MG tablet Take 500 mg by mouth 2 (two) times daily.      metoprolol  tartrate (LOPRESSOR ) 25 MG tablet Take 25 mg by mouth 2 (two) times daily.     olmesartan  (BENICAR ) 40 MG tablet Take 40 mg by mouth daily.     potassium chloride  SA (K-DUR,KLOR-CON ) 20 MEQ tablet Take 20 mEq by mouth 3 (three) times daily.      pravastatin  (PRAVACHOL ) 20 MG tablet Take 20 mg by mouth daily.     rosuvastatin  (CRESTOR ) 20 MG tablet Take 1 tablet (20 mg total) by mouth daily. 60 tablet 6   tamsulosin (FLOMAX) 0.4 MG CAPS capsule Take 0.4 mg by mouth daily.     No current facility-administered medications for this visit.     PHYSICAL EXAMINATION:   Vitals:   04/24/24 1407  BP: (!) 139/58  Pulse: 76  Resp: 20  Temp: 98.6 F (37 C)  SpO2: 97%   Filed Weights   04/24/24 1407  Weight: 198 lb (89.8 kg)    Physical Exam Vitals and nursing note reviewed.  HENT:     Head: Normocephalic and atraumatic.     Mouth/Throat:     Pharynx: Oropharynx is clear.  Eyes:     Extraocular Movements: Extraocular movements intact.     Pupils: Pupils are equal, round, and reactive to light.  Cardiovascular:     Rate and Rhythm: Normal rate and regular rhythm.     Heart sounds: Murmur heard.  Pulmonary:     Comments: Decreased breath sounds bilaterally.  Abdominal:     Palpations: Abdomen is soft.  Musculoskeletal:        General: Normal range of motion.     Cervical back: Normal range of motion.  Skin:    General: Skin is warm.  Neurological:     General: No focal deficit present.     Mental Status: He is alert and oriented to person, place, and time.  Psychiatric:        Behavior: Behavior normal.  Judgment: Judgment normal.      LABORATORY DATA:  I have reviewed  the data as listed Lab Results  Component Value Date   WBC 8.1 04/24/2024   HGB 9.4 (L) 04/24/2024   HCT 29.9 (L) 04/24/2024   MCV 84.7 04/24/2024   PLT 308 04/24/2024   Recent Labs    09/11/23 0811 12/18/23 1332 04/24/24 1409  NA 139 140 137  K 3.3* 3.6 3.2*  CL 99 103 101  CO2 27 30 26   GLUCOSE 123* 96 109*  BUN 43* 40* 42*  CREATININE 2.40* 2.17* 3.00*  CALCIUM  9.4 8.9 8.7*  GFRNONAA 28* 31* 21*  PROT 6.3*  --  6.3*  ALBUMIN 3.8  --  3.7  AST 12*  --  14*  ALT 9  --  11  ALKPHOS 62  --  64  BILITOT 0.5  --  0.5     No results found.  ASSESSMENT & PLAN:   Symptomatic anemia # Chronic anemia-[AUG 2024-Hb 8.8; MCV- 76] microcytic recently getting worse.  Interestingly patient is ONLY mildly symptomatic. 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  # Today hemoglobin is 9.- slow trend down- but overa ll stable  Discussed regarding iron infusions however patient wants to hold off until next visit.  I think is reasonable. Recommend 2 iron pills. [No GI discomfort]  # ETIOLOGY:[facility] AUG 2023- [[facility]]- recommend EGD/ further work up- however patient declines at this time.   # CKD stage III - IV [Dr.Korrapati]- M proetin; NEG; k/l= abnormal sec to CKD- stable.   # Incidental RCC left.  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 Defer to Urology re: ongoing surveillance.  #HTN- stable- - And also follow-up with nephrology closely.  55m # DISPOSITION: #  NO venofer  # follow up in  1st week of JAN MD; labs- cbc/bmp; iron studies; ferritin-possible venofer  Dr.B   All questions were answered. The patient knows to call the clinic with any problems, questions or concerns.    Cindy JONELLE Joe, MD 04/24/2024 2:51 PM

## 2024-04-24 NOTE — Assessment & Plan Note (Signed)
#   Chronic anemia-[AUG 2024-Hb 8.8; MCV- 76] microcytic recently getting worse.  Interestingly patient is ONLY mildly symptomatic. 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  # Today hemoglobin is 9.- slow trend down- but overa ll stable  Discussed regarding iron infusions however patient wants to hold off until next visit.  I think is reasonable. Recommend 2 iron pills. [No GI discomfort]  # ETIOLOGY:[facility] AUG 2023- [[facility]]- recommend EGD/ further work up- however patient declines at this time.   # CKD stage III - IV [Dr.Korrapati]- M proetin; NEG; k/l= abnormal sec to CKD- stable.   # Incidental RCC left.  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 Defer to Urology re: ongoing surveillance.  #HTN- stable- - And also follow-up with nephrology closely.  67m # DISPOSITION: #  NO venofer  # follow up in  1st week of JAN MD; labs- cbc/bmp; iron studies; ferritin-possible venofer  Dr.B

## 2024-04-26 ENCOUNTER — Encounter: Payer: Self-pay | Admitting: Internal Medicine

## 2024-04-26 ENCOUNTER — Ambulatory Visit
Admission: RE | Admit: 2024-04-26 | Discharge: 2024-04-26 | Disposition: A | Discharge status: Home / Self Care | Source: Ambulatory Visit | Attending: Interventional Radiology | Admitting: Interventional Radiology

## 2024-04-26 DIAGNOSIS — N2889 Other specified disorders of kidney and ureter: Secondary | ICD-10-CM

## 2024-04-26 MED ORDER — GADOPICLENOL 0.5 MMOL/ML IV SOLN
10.0000 mL | Freq: Once | INTRAVENOUS | Status: AC | PRN
  Administered 2024-04-26: 10 mL via INTRAVENOUS

## 2024-05-23 ENCOUNTER — Other Ambulatory Visit: Payer: Self-pay | Admitting: Interventional Radiology

## 2024-05-23 DIAGNOSIS — N2889 Other specified disorders of kidney and ureter: Secondary | ICD-10-CM

## 2024-06-05 NOTE — Progress Notes (Signed)
 Referring Physician(s): Dr. Rosina Riis  Supervising Physician: Karalee Beat  Chief Complaint: The patient is seen in follow up today s/p left renal mass microwave ablation 09/11/23  History of present illness:  Eric Browning, 73 year old male, has a medical history significant for BPH, CAD, heart failure, HTN, DM, peripheral vascular disease and chronic kidney disease with anemia and proteinuria. While undergoing work up for his CKD a renal ultrasound 12/13/22 identified a left renal mass. An MRI 12/22/22 revealed imaging characteristics compatible with renal cell carcinoma. A follow up MRI in October 2024 showed and interval increase in the size of the lesion and the patient was referred to Interventional Radiology to discuss treatment/management options.   The patient met with Dr. Karalee 08/24/23 and discussed the natural history of enhancing solid renal neoplasms as well as the fact that 80% of these lesions represent renal cell carcinoma. Dr. Karalee recommended CT-guided biopsy and concurrent CT-guided microwave ablation. He reviewed the risks, benefits and alternatives. Due to the challenging location of the lesion the patient was not an optimal surgical candidate. The patient was agreeable to proceed.   The patient presented to the [facility] IR department 09/11/23 and underwent a core biopsy of the lesion followed by a technically successful percutaneous thermal ablation. The patient tolerated the procedures well and was discharged home the same day. 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 The pathologist noted that this is typically a less aggressive and more indolent variant.   The patient followed up with Dr. Karalee 09/28/23 and was doing well. He reported an uncomplicated post-operative course and they discussed repeat MRI abdomen in 6 months followed by a clinic visit. The MRI was completed 04/26/24 and the patient presents to the IR  outpatient clinic today to review those results.   Past Medical History:  Diagnosis Date   Angina at rest    Arthritis of both hands    Bilateral cataracts    Bilateral renal cysts    BPH (benign prostatic hyperplasia)    CAD (coronary artery disease)    a.) LHC/PCI 12/31/2020: 90% dRCA (3.0 x 15 mm Resolute Onyx DES); 50% pRCA, minor lum irregs in LAD and LCx   Cardiomegaly    Cardiomyopathy (HCC)    a.) TTE 07/03/2015: EF 50%; b.) MV 07/03/2015: EF 53%; c.) TTE 01/08/2019: EF 45%; d.) MV 01/08/2019: EF 45-50%; e.) TTE 11/26/2020: EF 45%; f.) MV 11/26/2020: EF 51%; g.) TTE 03/06/2023: EF 40%   CKD (chronic kidney disease), stage III (HCC)    CVA (cerebral vascular accident) (HCC)    a.) noted on CT head 03/12/2017: RIGHT posterior  parietal encephalomalacia consistent with small chronic temporoparietal infarct   DDD (degenerative disc disease), cervical    Dyspnea on exertion    GERD (gastroesophageal reflux disease)    Gout    Heart murmur    HFrEF (heart failure with reduced ejection fraction) (HCC)    a.) TTE 07/03/2015: EF 50%, mild-mod LVH, inf/post HK, G1DD, triv MR/PR, mild AR/TR, RVSP 36.8; b.) TTE 01/08/2019: EF 45%, sep/apical HK, mild LVH, mild LAE, mild panval regurg, RVSP 37.2; d.) TTE 11/26/2020: EF 45%, apical.inf/post HK, G1DD, mild LAE/LVE, triv AR/PR, mild MR/TR, RVSP 52.9; e.)  TTE 03/06/2023: EF 40%, sep/apical/inf HK, sev LAE, mild AR/TR/PR, mod MR, RVSP 35.3   Hiatal hernia    High cholesterol    Hypertension    IDA (iron deficiency anemia)    Left renal mass 12/13/2022   a.) US  renal 12/13/2022:  2.7 x 2.4 x 2.5 cm; b.) MRI abdomen 12/22/2022: 2.9 x 2.6 x 2.4 cm LEFT anterior upper kidney; c.) MRI abdomen 06/14/2023: interval increase in size to 2.8 x 3.2 cm --> 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   Long term current use of aspirin     OSA on CPAP    Primary hypogonadism in male    Stenosis of left vertebral artery (severe)    T2DM (type 2 diabetes  mellitus) (HCC)     Past Surgical History:  Procedure Laterality Date   CATARACT EXTRACTION W/ INTRAOCULAR LENS IMPLANT Right 2013   COLONOSCOPY     COLONOSCOPY N/A 04/06/2022   Procedure: COLONOSCOPY;  Surgeon: Toledo, Ladell POUR, MD;  Location: [facility] ENDOSCOPY;  Service: Gastroenterology;  Laterality: N/A;   COLONOSCOPY WITH PROPOFOL  N/A 08/03/2015   Procedure: COLONOSCOPY WITH PROPOFOL ;  Surgeon: Lamar ONEIDA Holmes, MD;  Location: [facility] ENDOSCOPY;  Service: Endoscopy;  Laterality: N/A;   CORONARY STENT INTERVENTION N/A 12/31/2020   Procedure: CORONARY STENT INTERVENTION;  Surgeon: Florencio Cara BIRCH, MD;  Location: [facility] INVASIVE CV LAB;  Service: Cardiovascular;  Laterality: N/A;  RCA   EUS N/A 09/03/2015   Procedure: LOWER ENDOSCOPIC ULTRASOUND (EUS);  Surgeon: Asberry DELENA Coffee, MD;  Location: [facility] ENDOSCOPY;  Service: Endoscopy;  Laterality: N/A;   IR RADIOLOGIST EVAL & MGMT  08/24/2023   IR RADIOLOGIST EVAL & MGMT  09/28/2023   LEFT HEART CATH AND CORONARY ANGIOGRAPHY N/A 12/31/2020   Procedure: LEFT HEART CATH AND CORONARY ANGIOGRAPHY;  Surgeon: Florencio Cara BIRCH, MD;  Location: [facility] INVASIVE CV LAB;  Service: Cardiovascular;  Laterality: N/A;   REVERSE SHOULDER ARTHROPLASTY Right 12/12/2017   Procedure: REVERSE SHOULDER ARTHROPLASTY;  Surgeon: Edie Norleen PARAS, MD;  Location: [facility] ORS;  Service: Orthopedics;  Laterality: Right;   TONSILLECTOMY     TOTAL KNEE ARTHROPLASTY Right 05/12/2016   Procedure: TOTAL KNEE ARTHROPLASTY;  Surgeon: Norleen PARAS Edie, MD;  Location: [facility] ORS;  Service: Orthopedics;  Laterality: Right;    Allergies: Patient has no known allergies.  Medications: Prior to Admission medications   Medication Sig Start Date End Date Taking? Authorizing Provider  allopurinol  (ZYLOPRIM ) 300 MG tablet Take 300 mg by mouth daily.    [provider]  amLODipine  (NORVASC ) 10 MG tablet Take 1 tablet (10 mg total) by mouth daily. 01/02/21   Callwood, Dwayne D, MD  aspirin   EC 81 MG tablet Take 81 mg by mouth daily. Swallow whole.    [provider]  furosemide (LASIX) 20 MG tablet Take 20 mg by mouth daily. 07/03/23   [provider]  hydrALAZINE  (APRESOLINE ) 25 MG tablet Take 1 tablet (25 mg total) by mouth 3 (three) times daily. 01/01/21   Callwood, Dwayne D, MD  hydrochlorothiazide  (HYDRODIURIL ) 25 MG tablet Take 1 tablet (25 mg total) by mouth daily. 01/02/21   Callwood, Dwayne D, MD  metFORMIN  (GLUCOPHAGE ) 500 MG tablet Take 500 mg by mouth 2 (two) times daily.     [provider]  metoprolol  tartrate (LOPRESSOR ) 25 MG tablet Take 25 mg by mouth 2 (two) times daily.    [provider]  olmesartan  (BENICAR ) 40 MG tablet Take 40 mg by mouth daily. 12/07/20   [provider]  potassium chloride  SA (K-DUR,KLOR-CON ) 20 MEQ tablet Take 20 mEq by mouth 3 (three) times daily.     [provider]  pravastatin  (PRAVACHOL ) 20 MG tablet Take 20 mg by mouth daily. 05/25/23   [provider]  rosuvastatin  (CRESTOR ) 20 MG  tablet Take 1 tablet (20 mg total) by mouth daily. 01/01/21   Callwood, Dwayne D, MD  tamsulosin (FLOMAX) 0.4 MG CAPS capsule Take 0.4 mg by mouth daily. 09/17/20   [provider]     Family History  Problem Relation Age of Onset   Heart disease Mother    Stroke Father    Hypertension Father    Gout Father    Cancer Brother    Bone cancer Son    Brain cancer Son     Social History   Socioeconomic History   Marital status: Married    Spouse name: Not on file   Number of children: Not on file   Years of education: Not on file   Highest education level: Not on file  Occupational History   Occupation: works in cigarette factory  Tobacco Use   Smoking status: Former    Current packs/day: 0.00    Average packs/day: 1 pack/day for 30.0 years (30.0 ttl pk-yrs)    Types: Cigarettes    Start date: 09/28/1958    Quit date: 09/28/1988    Years since quitting: 35.7   Smokeless tobacco:  Never   Tobacco comments:    quit 1990  Vaping Use   Vaping status: Never Used  Substance and Sexual Activity   Alcohol use: No   Drug use: No   Sexual activity: Yes  Other Topics Concern   Not on file  Social History Narrative   Not on file   Social Drivers of Health   Financial Resource Strain: Medium Risk (03/29/2023)   Received from [facility]   Overall Financial Resource Strain (CARDIA)    Difficulty of Paying Living Expenses: Somewhat hard  Food Insecurity: No Food Insecurity (03/29/2023)   Received from [facility]   Hunger Vital Sign    Within the past 12 months, you worried that your food would run out before you got the money to buy more.: Never true    Within the past 12 months, the food you bought just didn't last and you didn't have money to get more.: Never true  Transportation Needs: No Transportation Needs (03/29/2023)   Received from [facility] - Transportation    In the past 12 months, has lack of transportation kept you from medical appointments or from getting medications?: No    Lack of Transportation (Non-Medical): No  Physical Activity: Not on file  Stress: Not on file  Social Connections: Not on file     Vital Signs: There were no vitals taken for this visit.  Physical Exam  Imaging: No results found.  Labs:  CBC: Recent Labs    08/07/23 1307 09/11/23 0811 12/18/23 1332 04/24/24 1409  WBC 9.2 7.3 8.1 8.1  HGB 10.5* 9.6* 9.9* 9.4*  HCT 34.4* 30.4* 31.9* 29.9*  PLT 371 329 305 308    COAGS: Recent Labs    09/11/23 0811  INR 1.0    BMP: Recent Labs    08/07/23 1307 09/11/23 0811 12/18/23 1332 04/24/24 1409  NA 139 139 140 137  K 3.1* 3.3* 3.6 3.2*  CL 98 99 103 101  CO2 29 27 30 26   GLUCOSE 96 123* 96 109*  BUN 38* 43* 40* 42*  CALCIUM  9.5 9.4 8.9 8.7*  CREATININE 2.31* 2.40* 2.17* 3.00*  GFRNONAA 29* 28* 31* 21*    LIVER FUNCTION TESTS: Recent Labs     09/11/23 0811 04/24/24 1409  BILITOT 0.5 0.5  AST 12* 14*  ALT 9 11  ALKPHOS 62 64  PROT 6.3* 6.3*  ALBUMIN 3.8 3.7    Assessment and Plan:  73 year old male with a history of left renal clear-cell/papillary renal carcinoma. He underwent a technically successful percutaneous thermal ablation 09/11/23.   Electronically Signed: Warren JONELLE Dais 06/05/2024, 10:56 AM   I spent a total of 25 Minutes in face to face in clinical consultation, greater than 50% of which was counseling/coordinating care for left renal neoplasm.

## 2024-06-06 ENCOUNTER — Ambulatory Visit
Admission: RE | Admit: 2024-06-06 | Discharge: 2024-06-06 | Disposition: A | Discharge status: Home / Self Care | Source: Ambulatory Visit | Attending: Interventional Radiology | Admitting: Interventional Radiology

## 2024-06-06 DIAGNOSIS — N2889 Other specified disorders of kidney and ureter: Secondary | ICD-10-CM

## 2024-06-06 HISTORY — PX: IR RADIOLOGIST EVAL & MGMT: IMG5224

## 2024-06-11 ENCOUNTER — Other Ambulatory Visit: Payer: Self-pay | Admitting: Internal Medicine

## 2024-06-11 DIAGNOSIS — I255 Ischemic cardiomyopathy: Secondary | ICD-10-CM

## 2024-06-12 ENCOUNTER — Encounter
Admission: RE | Disposition: A | Discharge status: Home / Self Care | Payer: Self-pay | Source: Home / Self Care | Attending: Internal Medicine

## 2024-06-12 ENCOUNTER — Ambulatory Visit
Admission: RE | Admit: 2024-06-12 | Discharge: 2024-06-12 | Disposition: A | Discharge status: Home / Self Care | Attending: Internal Medicine | Admitting: Internal Medicine

## 2024-06-12 ENCOUNTER — Other Ambulatory Visit: Payer: Self-pay

## 2024-06-12 DIAGNOSIS — I5022 Chronic systolic (congestive) heart failure: Secondary | ICD-10-CM | POA: Diagnosis not present

## 2024-06-12 DIAGNOSIS — E669 Obesity, unspecified: Secondary | ICD-10-CM | POA: Diagnosis not present

## 2024-06-12 DIAGNOSIS — Z6830 Body mass index (BMI) 30.0-30.9, adult: Secondary | ICD-10-CM | POA: Insufficient documentation

## 2024-06-12 DIAGNOSIS — R0600 Dyspnea, unspecified: Secondary | ICD-10-CM | POA: Diagnosis not present

## 2024-06-12 DIAGNOSIS — Z7984 Long term (current) use of oral hypoglycemic drugs: Secondary | ICD-10-CM | POA: Insufficient documentation

## 2024-06-12 DIAGNOSIS — Z79899 Other long term (current) drug therapy: Secondary | ICD-10-CM | POA: Diagnosis not present

## 2024-06-12 DIAGNOSIS — E782 Mixed hyperlipidemia: Secondary | ICD-10-CM | POA: Diagnosis not present

## 2024-06-12 DIAGNOSIS — I272 Pulmonary hypertension, unspecified: Secondary | ICD-10-CM | POA: Diagnosis present

## 2024-06-12 DIAGNOSIS — I255 Ischemic cardiomyopathy: Secondary | ICD-10-CM | POA: Insufficient documentation

## 2024-06-12 DIAGNOSIS — Z87891 Personal history of nicotine dependence: Secondary | ICD-10-CM | POA: Diagnosis not present

## 2024-06-12 DIAGNOSIS — I2510 Atherosclerotic heart disease of native coronary artery without angina pectoris: Secondary | ICD-10-CM | POA: Insufficient documentation

## 2024-06-12 DIAGNOSIS — E119 Type 2 diabetes mellitus without complications: Secondary | ICD-10-CM | POA: Diagnosis not present

## 2024-06-12 DIAGNOSIS — I110 Hypertensive heart disease with heart failure: Secondary | ICD-10-CM | POA: Insufficient documentation

## 2024-06-12 HISTORY — PX: RIGHT HEART CATH: CATH118263

## 2024-06-12 LAB — GLUCOSE, CAPILLARY
Glucose-Capillary: 104 mg/dL — ABNORMAL HIGH (ref 70–99)
Glucose-Capillary: 89 mg/dL (ref 70–99)

## 2024-06-12 LAB — POCT I-STAT EG7
Acid-Base Excess: 6 mmol/L — ABNORMAL HIGH (ref 0.0–2.0)
Bicarbonate: 32.3 mmol/L — ABNORMAL HIGH (ref 20.0–28.0)
Calcium, Ion: 1.18 mmol/L (ref 1.15–1.40)
HCT: 30 % — ABNORMAL LOW (ref 39.0–52.0)
Hemoglobin: 10.2 g/dL — ABNORMAL LOW (ref 13.0–17.0)
O2 Saturation: 55 %
Potassium: 2.9 mmol/L — ABNORMAL LOW (ref 3.5–5.1)
Sodium: 139 mmol/L (ref 135–145)
TCO2: 34 mmol/L — ABNORMAL HIGH (ref 22–32)
pCO2, Ven: 52.7 mmHg (ref 44–60)
pH, Ven: 7.395 (ref 7.25–7.43)
pO2, Ven: 30 mmHg — CL (ref 32–45)

## 2024-06-12 SURGERY — RIGHT HEART CATH
Anesthesia: Moderate Sedation | Laterality: Right

## 2024-06-12 MED ORDER — LIDOCAINE HCL 1 % IJ SOLN
INTRAMUSCULAR | Status: AC
  Filled 2024-06-12: qty 20

## 2024-06-12 MED ORDER — SODIUM CHLORIDE 0.9% FLUSH: 3.0000 mL | Freq: Two times a day (BID) | INTRAVENOUS | Status: DC

## 2024-06-12 MED ORDER — HEPARIN (PORCINE) IN NACL 1000-0.9 UT/500ML-% IV SOLN
INTRAVENOUS | Status: AC
  Filled 2024-06-12: qty 1000

## 2024-06-12 MED ORDER — ACETAMINOPHEN 325 MG PO TABS: 650.0000 mg | ORAL_TABLET | ORAL | Status: DC | PRN

## 2024-06-12 MED ORDER — SODIUM CHLORIDE 0.9% FLUSH: 3.0000 mL | INTRAVENOUS | Status: DC | PRN

## 2024-06-12 MED ORDER — FREE WATER: 500.0000 mL | Freq: Once | Status: DC

## 2024-06-12 MED ORDER — HEPARIN (PORCINE) IN NACL 1000-0.9 UT/500ML-% IV SOLN
INTRAVENOUS | Status: DC | PRN
  Administered 2024-06-12: 500 mL

## 2024-06-12 MED ORDER — ASPIRIN 81 MG PO CHEW: 81.0000 mg | CHEWABLE_TABLET | Freq: Once | ORAL | Status: DC

## 2024-06-12 MED ORDER — MIDAZOLAM HCL 2 MG/2ML IJ SOLN
INTRAMUSCULAR | Status: DC | PRN
  Administered 2024-06-12: 1 mg via INTRAVENOUS

## 2024-06-12 MED ORDER — LIDOCAINE HCL (PF) 1 % IJ SOLN
INTRAMUSCULAR | Status: AC
  Filled 2024-06-12: qty 30

## 2024-06-12 MED ORDER — LIDOCAINE HCL (PF) 1 % IJ SOLN
INTRAMUSCULAR | Status: DC | PRN
Start: 2024-06-12 — End: 2024-06-12
  Administered 2024-06-12: 2 mL
  Administered 2024-06-12: 10 mL

## 2024-06-12 MED ORDER — FENTANYL CITRATE (PF) 100 MCG/2ML IJ SOLN
INTRAMUSCULAR | Status: AC
  Filled 2024-06-12: qty 2

## 2024-06-12 MED ORDER — MIDAZOLAM HCL 2 MG/2ML IJ SOLN
INTRAMUSCULAR | Status: AC
  Filled 2024-06-12: qty 2

## 2024-06-12 MED ORDER — ASPIRIN 81 MG PO CHEW
CHEWABLE_TABLET | ORAL | Status: AC
  Administered 2024-06-12: 81 mg
  Filled 2024-06-12: qty 1

## 2024-06-12 MED ORDER — FENTANYL CITRATE (PF) 100 MCG/2ML IJ SOLN
INTRAMUSCULAR | Status: DC | PRN
  Administered 2024-06-12: 25 ug via INTRAVENOUS

## 2024-06-12 MED ORDER — HYDRALAZINE HCL 20 MG/ML IJ SOLN
10.0000 mg | INTRAMUSCULAR | Status: DC | PRN
Start: 2024-06-12 — End: 2024-06-12

## 2024-06-12 MED ORDER — TORSEMIDE 40 MG PO TABS: 40.0000 mg | ORAL_TABLET | Freq: Two times a day (BID) | ORAL | 0 refills | Status: DC

## 2024-06-12 MED ORDER — SODIUM CHLORIDE 0.9 % IV SOLN: 250.0000 mL | INTRAVENOUS | Status: DC | PRN

## 2024-06-12 MED ORDER — FREE WATER
250.0000 mL | Freq: Once | Status: AC
  Administered 2024-06-12: 250 mL via ORAL

## 2024-06-12 MED ORDER — ONDANSETRON HCL 4 MG/2ML IJ SOLN: 4.0000 mg | Freq: Four times a day (QID) | INTRAMUSCULAR | Status: DC | PRN

## 2024-06-12 SURGICAL SUPPLY — 10 items
CATH SWAN GANZ 7F STRAIGHT (CATHETERS) IMPLANT
DRAPE BRACHIAL (DRAPES) IMPLANT
GUIDEWIRE EMER 3M J .025X150CM (WIRE) IMPLANT
NDL PERC 18GX7CM (NEEDLE) IMPLANT
NEEDLE PERC 18GX7CM (NEEDLE) ×1 IMPLANT
PACK CARDIAC CATH (CUSTOM PROCEDURE TRAY) ×1 IMPLANT
SET ATX-X65L (MISCELLANEOUS) IMPLANT
SHEATH AVANTI 7FRX11 (SHEATH) IMPLANT
SHEATH GLIDE SLENDER 4/5FR (SHEATH) IMPLANT
WIRE NITINOL .018 (WIRE) IMPLANT

## 2024-06-14 DIAGNOSIS — I272 Pulmonary hypertension, unspecified: Secondary | ICD-10-CM

## 2024-06-17 ENCOUNTER — Encounter: Payer: Self-pay | Admitting: Internal Medicine

## 2024-06-24 ENCOUNTER — Encounter (HOSPITAL_COMMUNITY): Payer: Self-pay

## 2024-06-26 ENCOUNTER — Other Ambulatory Visit: Payer: Self-pay | Admitting: Internal Medicine

## 2024-06-26 ENCOUNTER — Ambulatory Visit
Admission: RE | Admit: 2024-06-26 | Discharge: 2024-06-26 | Disposition: A | Discharge status: Home / Self Care | Source: Ambulatory Visit | Attending: Internal Medicine | Admitting: Internal Medicine

## 2024-06-26 DIAGNOSIS — I255 Ischemic cardiomyopathy: Secondary | ICD-10-CM | POA: Diagnosis present

## 2024-06-26 MED ORDER — GADOBUTROL 1 MMOL/ML IV SOLN
11.0000 mL | Freq: Once | INTRAVENOUS | Status: AC | PRN
  Administered 2024-06-26: 11 mL via INTRAVENOUS

## 2024-07-15 NOTE — Progress Notes (Addendum)
 Follow-up   History of Present Illness: Eric Browning is a 73 y.o. male who presents to clinic for recheck. Very severe copd, went over pfts with him. Chronic dyspnea, he thinks he is worse. No worsening edema or orthopnea.  No chest pain, no wheezing. No calf pain, gerd fever or chills. No recent travel.    Current Medications:  Current Outpatient Medications  Medication Sig Dispense Refill  . acetaminophen  (TYLENOL ) 500 MG tablet Take 1,000 mg by mouth as needed for Pain    . albuterol  90 mcg/actuation inhaler Inhale 2 inhalations into the lungs every 6 (six) hours as needed 1 each 1  . allopurinoL  (ZYLOPRIM ) 100 MG tablet TAKE 1 TABLET BY MOUTH ONCE  DAILY 100 tablet 1  . amLODIPine  (NORVASC ) 5 MG tablet TAKE 1 TABLET BY MOUTH ONCE  DAILY 100 tablet 1  . aspirin  81 MG EC tablet Take 81 mg by mouth once daily.    . carvediloL  (COREG ) 12.5 MG tablet Take 1 tablet (12.5 mg total) by mouth 2 (two) times daily with meals 60 tablet 11  . clopidogreL (PLAVIX) 75 mg tablet TAKE 1 TABLET BY MOUTH DAILY 60 tablet 5  . colchicine  (COLCRYS ) 0.6 mg tablet Take 2 tablets then 1 tab 1 hr later for acute gout flare-ups; take only as needed for acute gout flare-ups 12 tablet 0  . cyanocobalamin  (VITAMIN B12) 1000 MCG tablet Take 1,000 mcg by mouth once daily    . dapagliflozin propanediol (FARXIGA) 10 mg tablet Take 10 mg by mouth once daily    . ezetimibe  (ZETIA ) 10 mg tablet Take 1 tablet (10 mg total) by mouth once daily 30 tablet 11  . ferrous sulfate 325 (65 FE) MG tablet Take 325 mg by mouth daily with breakfast    . olmesartan  (BENICAR ) 40 MG tablet TAKE 1 TABLET BY MOUTH ONCE  DAILY 100 tablet 2  . potassium chloride  (KLOR-CON  M20) 20 MEQ ER tablet TAKE 1 TABLET THREE TIMES DAILY 300 tablet 3  . pravastatin  (PRAVACHOL ) 20 MG tablet TAKE 1 TABLET BY MOUTH ONCE  DAILY 100 tablet 2  . tamsulosin  (FLOMAX ) 0.4 mg capsule TAKE 1 CAPSULE BY MOUTH ONCE  DAILY 1/2 HOUR AFTER SAME MEAL  EVERY DAY 100  capsule 1  . TORsemide  (DEMADEX ) 20 MG tablet Take 2 tablets (40 mg total) by mouth once daily 100 tablet 1  . blood glucose diagnostic test strip Use 1 each (1 strip total) once daily Use as instructed. Dx E11.69 True Metrix 100 each 1  . blood glucose meter kit Use as directed True Metrix Dx E11.69 1 each 0  . lancing device with lancets kit Use 1 each once daily Use as instructed. True Metrix Dx E11.69 100 each 1  . sildenafiL (VIAGRA) 100 MG tablet Take 1 tablet (100 mg total) by mouth once daily as needed for Erectile Dysfunction for up to 30 days 12 tablet 0   No current facility-administered medications for this visit.    Problem List:  Patient Active Problem List  Diagnosis  . Type 2 diabetes mellitus with hyperlipidemia (A1c 5.8% - 07/03/24)  . Essential hypertension  . Pure hypercholesterolemia (LDL 48 - 07/03/24)  . Morbid obesity due to excess calories (CMS-HCC)  . Personal history of gout (uric acid 5.7 - 10/16/15)  . Status post total knee replacement using cement, right  . Chronic anemia, unspecified (Hgb 8.8 - 07/03/24)  . Status post reverse total shoulder replacement, right  . B12 deficiency (551 - 10/25/19)  .  Medicare annual wellness visit, initial 11/01/19  . Benign prostatic hyperplasia with incomplete bladder emptying  . Systolic dysfunction, left ventricle (EF 45% - ECHO 01/08/19)  . Medicare annual wellness visit, subsequent 07/10/24  . CAD S/P percutaneous coronary angioplasty (12/2020) - followed by Dr. Florencio  . CKD (chronic kidney disease) stage 4 (Cr 3, GFR 21 - 07/03/24) - followed by Dr. Dennise  . Urine test positive for microalbuminuria  . Renal lesion (MRI 06/14/23) - followed by Dr. Penne  . Iron deficiency anemia secondary to inadequate dietary iron intake (Hgb 10.1 - 12/27/23)  . Chronic kidney disease due to type 2 diabetes mellitus (CMS/HHS-HCC)  . Congestive heart failure (CMS/HHS-HCC)  . Hyperlipidemia  . OSA on CPAP  . Pulmonary emphysema  (CMS/HHS-HCC)    History: Past Medical History:  Diagnosis Date  . Cataracts, bilateral    Followed by [facility]  . Diabetes mellitus type 2, uncomplicated (CMS/HHS-HCC)    AODM 6.5%- 10/2012  . DOE (dyspnea on exertion)   . Gout   . History of hypokalemia    K 3.6- 10/2012  . Hyperlipidemia    LDL 73- 10/2012  . Hypertension   . Obesity   . Primary male hypogonadism    69- 11/2012; follows with Dr. Damian    Past Surgical History:  Procedure Laterality Date  . COLONOSCOPY  06/29/2012   Adenomatous Polyps: CBF 06/2015; Recall Ltr mailed 05/29/2015 (dw)  . COLONOSCOPY  08/03/2015   Adenomatous Polyp, Rectal Mass; Needs EUS per RTE; Sch'ed 08/13/2015: CBF 07/2020  . Right TKA using all-cemented biomet vangaurd system with a 70 mm pcr femur and a 79mm tibial tray with a 10 mm e-poly insert and a 37 x 10 mm all-poly 3 pegged domed patella  Right 05/12/2016   Dr.Poggi   . REVERSE RIGHT TOTAL SHOULDER ARTHROPLASTY Right 12/12/2017   DR.POGGI  . Colon @ [facility]  04/06/2022   Tubular adenomas/Low grade dysplasia/PHx CP/Repeat 77yrs/TKT  . CARDIAC CATHETERIZATION      Family History  Problem Relation Name Age of Onset  . Myocardial Infarction (Heart attack) Mother    . Stroke Father    . Stroke Brother      Social History   Socioeconomic History  . Marital status: Widowed  Tobacco Use  . Smoking status: Former    Current packs/day: 0.00    Average packs/day: 0.7 packs/day for 30.0 years (21.0 ttl pk-yrs)    Types: Cigarettes    Start date: 08/29/1958    Quit date: 08/29/1988    Years since quitting: 35.9    Passive exposure: Past  . Smokeless tobacco: Never  Vaping Use  . Vaping status: Never Used  Substance and Sexual Activity  . Alcohol use: No    Alcohol/week: 0.0 standard drinks of alcohol  . Drug use: Never  . 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 Difficulty of Paying Living Expenses: Not hard at all  Food Insecurity: No Food Insecurity (07/10/2024)   Hunger Vital Sign   . Worried About Programme Researcher, Broadcasting/film/video in the Last Year: Never true   . Ran Out of Food in the Last Year: Never true  Transportation  Needs: No Transportation Needs (07/10/2024)   PRAPARE - Transportation   . Lack of Transportation (Medical): No   . Lack of Transportation (Non-Medical): No    Allergies:  Patient has no known allergies.  Review of Systems: As per above. Pretty much unchanged with the exception that his breathing is a little worse No associated cardiopulmonary, GI, GU, dermatological symptoms today. No focal neurological symptoms or psychological changes. No edema, leg pain, fever or lost of appetite  Physical Exam: Pulse 76   Wt 89.4 kg (197 lb)   SpO2 93% Comment: room air  BMI 30.85 kg/m  89.4 kg (197 lb) 93% General:  NAD. Able to speak in complete sentences without cough or dyspnea HEENT: Normocephalic, nontraumatic. Extraocular movements intact NECK: Supple. No JVD, nodes, thryomegaly CV: RRR no murmurs, gallops, rubs PULM: Normal respiratory effort, Clear to auscultation bilaterally without wheezing or crackles EXTREMITIES: No significant edema, cyanosis or Homans'signs SKIN: Fair turgor. No rashes LYMPHATIC: No nodes NEURO: No gross deficits PSYCH: Appropriate affect   Diagnostics: SPO2 At Rest On Room Air: 93% SPO2 With Exertion On Room Air: 90% SPO2 On Oxygen 2 LPM With Exertion: n/a%    SaO2 HR BORG- DYSPNEA BORG- FATIGUE TIME TOTAL DISTANCE    START 95 76 0 0      END 97 96 0 2 6 min 240 meters     NTERPRETATION MILD LV SYSTOLIC DYSFUNCTION (See above) NORMAL RIGHT VENTRICULAR SYSTOLIC FUNCTION SMALL PERICARDIAL EFFUSION  (See above) MODERATE VALVULAR REGURGITATION (See above) NO VALVULAR STENOSIS AVA(VTI)= 1.59cm^2 SCLEROTIC AORTIC VALVE MILD AR, TR, PR MODERATE MR EF 40% _________________________________________________________________________________________ Electronically signed by      MD Marolyn Dooms on 03/08/2023 01: 26 PM          Performed By: Belvin Bari BROCKS, RDCS    Ordering Physician: THEOTIS LAVELLE BRAVO   Dist RCA lesion is 90% stenosed.   Prox RCA lesion is 50% stenosed.   Left main large free of disease   LAD large minor irregularity   Circumflex large minor irregularities   Left ventricular function between 50 and 55%   Conclusion  Outpatient diagnostic cardiac  Indication is unstable angina  Unsuccessful radial approach unable to deliver catheter  Case switch to groin  TR band applied  intervention  Successful PCI and stent of distal RCA with DES  3.0 x 15 mm resolute Onyx  Postdilated with a 3.5 x 8 mm [place] trek to 16 atm  Lesion reduced from 90 down to 0%    Fvc  44 %, fev1 46, dlco 52, dlco /va 95 %  SPIROMETRY: FVC was .91 L, 31 % of predicted FEV1 was .63 L, 28 % of predicted FEV1/FVC ratio was  90 % of predicted FEF 25-75% liters per second was 22 % of predicted  LUNG VOLUMES: TLC was - % of predicted RV was - % of predicted Attempted X 2:Patient not able to complete maneuver states he does not have enough breath and is tired.   DIFFUSION CAPACITY: DLCO was - % of predicted DLCO/VA was - % of predicted Unable to accurately do w/o lung volumes.    Good patient effort with good repeatability.  Interpretation: Very severe mix ventiatory defect. Unfortunately was unable to complete lung volume and diffusion capacity  Interpreting Physician  Dr. Theotis     Impression: Copd, VERY SEVERE COPD Per echo mild LV dysfunction, ? Underlying pulmonary hypertension. Spiro/lung volumes restrictive.( Due to weight, cardiomegaly, pulm edema)  Today he thinks  his  copd is worse on present regimen. PFTS today noted above -pace self -prn albuterol  2 puffs qid, breztri 2 puffs bid ( still working for him)  -add ohtulavayre -f/u in 4.5 months    Hx of osa on cpap, encouraged to wear it every night. Compliance 83 %. AHI 2.2  Not an oral appliance candidate. Has dentures.  Maintain ideal weight Stay on same cpap settings  Diagnoses and all orders for this visit:  Need for vaccination -     FLU VACCINE MDCK IIV3 (EGG FREE), IM PF, (51MO+)(FLUCELVAX)

## 2024-07-27 NOTE — Procedures (Signed)
 15-day Holter Indication atrial fibrillation Hookup date October 28 to June 30, 2024 Patient wore the monitor for about 2 weeks  Total beats 517,517 Minimum rate 29 maximum 165 average 76 Mostly sinus rhythm Occasional PVCs 3% burden Rare PACs less than 1% No sustained runs Evidence of second-degree high-grade block No ST segment changes No diary submitted No evidence of atrial fibrillation  Conclusion Mostly sinus rhythm benign Holter except for Short episode of nonsustained VT History of episodes of second-degree AV block Consider referral to EP consideration for possible permanent pacemaker

## 2024-07-29 ENCOUNTER — Encounter: Payer: Self-pay | Admitting: Internal Medicine

## 2024-07-29 ENCOUNTER — Other Ambulatory Visit (HOSPITAL_COMMUNITY): Payer: Self-pay

## 2024-07-29 ENCOUNTER — Telehealth (HOSPITAL_COMMUNITY): Payer: Self-pay | Admitting: Pharmacy Technician

## 2024-07-29 ENCOUNTER — Emergency Department

## 2024-07-29 ENCOUNTER — Inpatient Hospital Stay
Admission: EM | Admit: 2024-07-29 | Discharge: 2024-08-09 | DRG: 291 | Disposition: A | Discharge status: Skilled Nursing Facility | Attending: Obstetrics and Gynecology | Admitting: Obstetrics and Gynecology

## 2024-07-29 ENCOUNTER — Other Ambulatory Visit: Payer: Self-pay

## 2024-07-29 DIAGNOSIS — I509 Heart failure, unspecified: Secondary | ICD-10-CM

## 2024-07-29 DIAGNOSIS — N179 Acute kidney failure, unspecified: Secondary | ICD-10-CM | POA: Diagnosis not present

## 2024-07-29 DIAGNOSIS — N186 End stage renal disease: Secondary | ICD-10-CM

## 2024-07-29 DIAGNOSIS — D649 Anemia, unspecified: Secondary | ICD-10-CM | POA: Diagnosis present

## 2024-07-29 DIAGNOSIS — I10 Essential (primary) hypertension: Secondary | ICD-10-CM | POA: Diagnosis present

## 2024-07-29 DIAGNOSIS — R0902 Hypoxemia: Secondary | ICD-10-CM | POA: Diagnosis not present

## 2024-07-29 DIAGNOSIS — I5023 Acute on chronic systolic (congestive) heart failure: Secondary | ICD-10-CM | POA: Insufficient documentation

## 2024-07-29 DIAGNOSIS — Z8739 Personal history of other diseases of the musculoskeletal system and connective tissue: Secondary | ICD-10-CM

## 2024-07-29 DIAGNOSIS — Z4901 Encounter for fitting and adjustment of extracorporeal dialysis catheter: Secondary | ICD-10-CM

## 2024-07-29 DIAGNOSIS — J9602 Acute respiratory failure with hypercapnia: Secondary | ICD-10-CM

## 2024-07-29 DIAGNOSIS — E119 Type 2 diabetes mellitus without complications: Secondary | ICD-10-CM

## 2024-07-29 DIAGNOSIS — J441 Chronic obstructive pulmonary disease with (acute) exacerbation: Secondary | ICD-10-CM

## 2024-07-29 DIAGNOSIS — E7800 Pure hypercholesterolemia, unspecified: Secondary | ICD-10-CM | POA: Diagnosis present

## 2024-07-29 DIAGNOSIS — R0602 Shortness of breath: Secondary | ICD-10-CM

## 2024-07-29 DIAGNOSIS — N289 Disorder of kidney and ureter, unspecified: Secondary | ICD-10-CM | POA: Diagnosis not present

## 2024-07-29 DIAGNOSIS — E291 Testicular hypofunction: Secondary | ICD-10-CM | POA: Diagnosis present

## 2024-07-29 LAB — BLOOD GAS, VENOUS
Acid-Base Excess: 3.2 mmol/L — ABNORMAL HIGH (ref 0.0–2.0)
Bicarbonate: 31.5 mmol/L — ABNORMAL HIGH (ref 20.0–28.0)
O2 Content: 4 L/min
O2 Saturation: 77.7 %
Patient temperature: 37
pCO2, Ven: 64 mmHg — ABNORMAL HIGH (ref 44–60)
pH, Ven: 7.3 (ref 7.25–7.43)
pO2, Ven: 48 mmHg — ABNORMAL HIGH (ref 32–45)

## 2024-07-29 LAB — COMPREHENSIVE METABOLIC PANEL WITH GFR
ALT: 7 U/L (ref 0–44)
AST: 11 U/L — ABNORMAL LOW (ref 15–41)
Albumin: 4.2 g/dL (ref 3.5–5.0)
Alkaline Phosphatase: 98 U/L (ref 38–126)
Anion gap: 10 (ref 5–15)
BUN: 36 mg/dL — ABNORMAL HIGH (ref 8–23)
CO2: 28 mmol/L (ref 22–32)
Calcium: 9.4 mg/dL (ref 8.9–10.3)
Chloride: 109 mmol/L (ref 98–111)
Creatinine, Ser: 2.9 mg/dL — ABNORMAL HIGH (ref 0.61–1.24)
GFR, Estimated: 22 mL/min — ABNORMAL LOW (ref >60)
Glucose, Bld: 130 mg/dL — ABNORMAL HIGH (ref 70–99)
Potassium: 4.1 mmol/L (ref 3.5–5.1)
Sodium: 147 mmol/L — ABNORMAL HIGH (ref 135–145)
Total Bilirubin: 0.3 mg/dL (ref 0.0–1.2)
Total Protein: 7.1 g/dL (ref 6.5–8.1)

## 2024-07-29 LAB — RESP PANEL BY RT-PCR (RSV, FLU A&B, COVID)  RVPGX2
Influenza A by PCR: NEGATIVE
Influenza B by PCR: NEGATIVE
Resp Syncytial Virus by PCR: NEGATIVE
SARS Coronavirus 2 by RT PCR: NEGATIVE

## 2024-07-29 LAB — CBC
HCT: 33.7 % — ABNORMAL LOW (ref 39.0–52.0)
Hemoglobin: 10.5 g/dL — ABNORMAL LOW (ref 13.0–17.0)
MCH: 26.6 pg (ref 26.0–34.0)
MCHC: 31.2 g/dL (ref 30.0–36.0)
MCV: 85.3 fL (ref 80.0–100.0)
Platelets: 343 K/uL (ref 150–400)
RBC: 3.95 MIL/uL — ABNORMAL LOW (ref 4.22–5.81)
RDW: 15.6 % — ABNORMAL HIGH (ref 11.5–15.5)
WBC: 7.1 K/uL (ref 4.0–10.5)
nRBC: 0 % (ref 0.0–0.2)

## 2024-07-29 LAB — TROPONIN T, HIGH SENSITIVITY
Troponin T High Sensitivity: 39 ng/L — ABNORMAL HIGH (ref 0–19)
Troponin T High Sensitivity: 34 ng/L — ABNORMAL HIGH (ref 0–19)

## 2024-07-29 LAB — GLUCOSE, CAPILLARY
Glucose-Capillary: 133 mg/dL — ABNORMAL HIGH (ref 70–99)
Glucose-Capillary: 122 mg/dL — ABNORMAL HIGH (ref 70–99)

## 2024-07-29 LAB — CBG MONITORING, ED: Glucose-Capillary: 112 mg/dL — ABNORMAL HIGH (ref 70–99)

## 2024-07-29 LAB — HEMOGLOBIN A1C
Hgb A1c MFr Bld: 5.5 % (ref 4.8–5.6)
Mean Plasma Glucose: 111 mg/dL

## 2024-07-29 LAB — PROTIME-INR
INR: 1.1 (ref 0.8–1.2)
Prothrombin Time: 15 s (ref 11.4–15.2)

## 2024-07-29 LAB — PRO BRAIN NATRIURETIC PEPTIDE: Pro Brain Natriuretic Peptide: 7322 pg/mL — ABNORMAL HIGH (ref <300.0)

## 2024-07-29 MED ORDER — METHYLPREDNISOLONE SODIUM SUCC 125 MG IJ SOLR
125.0000 mg | Freq: Once | INTRAMUSCULAR | Status: AC
  Administered 2024-07-29: 125 mg via INTRAVENOUS
  Filled 2024-07-29: qty 2

## 2024-07-29 MED ORDER — SODIUM CHLORIDE 0.9 % IV SOLN: 250.0000 mL | INTRAVENOUS | Status: DC | PRN

## 2024-07-29 MED ORDER — TAMSULOSIN HCL 0.4 MG PO CAPS
0.4000 mg | ORAL_CAPSULE | Freq: Every day | ORAL | Status: DC
  Administered 2024-07-29 – 2024-08-09 (×12): 0.4 mg via ORAL
  Filled 2024-07-29 (×11): qty 1

## 2024-07-29 MED ORDER — ALLOPURINOL 100 MG PO TABS
100.0000 mg | ORAL_TABLET | Freq: Every day | ORAL | Status: DC
  Administered 2024-07-29 – 2024-07-30 (×2): 100 mg via ORAL
  Filled 2024-07-29 (×3): qty 1

## 2024-07-29 MED ORDER — METHYLPREDNISOLONE SODIUM SUCC 40 MG IJ SOLR
40.0000 mg | Freq: Two times a day (BID) | INTRAMUSCULAR | Status: DC
  Administered 2024-07-29 – 2024-08-01 (×6): 40 mg via INTRAVENOUS
  Filled 2024-07-29 (×6): qty 1

## 2024-07-29 MED ORDER — ACETAZOLAMIDE SODIUM 500 MG IJ SOLR
500.0000 mg | Freq: Once | INTRAMUSCULAR | Status: AC
  Administered 2024-07-29: 500 mg via INTRAVENOUS
  Filled 2024-07-29: qty 500

## 2024-07-29 MED ORDER — EZETIMIBE 10 MG PO TABS
10.0000 mg | ORAL_TABLET | Freq: Every day | ORAL | Status: DC
  Administered 2024-07-29 – 2024-08-09 (×12): 10 mg via ORAL
  Filled 2024-07-29 (×12): qty 1

## 2024-07-29 MED ORDER — HEPARIN SODIUM (PORCINE) 5000 UNIT/ML IJ SOLN
5000.0000 [IU] | Freq: Two times a day (BID) | INTRAMUSCULAR | Status: DC
  Administered 2024-07-29 – 2024-08-09 (×22): 5000 [IU] via SUBCUTANEOUS
  Filled 2024-07-29 (×22): qty 1

## 2024-07-29 MED ORDER — SODIUM CHLORIDE 0.9% FLUSH: 3.0000 mL | INTRAVENOUS | Status: DC | PRN

## 2024-07-29 MED ORDER — IPRATROPIUM-ALBUTEROL 0.5-2.5 (3) MG/3ML IN SOLN
3.0000 mL | Freq: Four times a day (QID) | RESPIRATORY_TRACT | Status: DC
  Administered 2024-07-29 – 2024-07-30 (×5): 3 mL via RESPIRATORY_TRACT
  Filled 2024-07-29 (×5): qty 3

## 2024-07-29 MED ORDER — ASPIRIN 81 MG PO TBEC
81.0000 mg | DELAYED_RELEASE_TABLET | Freq: Every day | ORAL | Status: DC
  Administered 2024-07-29 – 2024-08-09 (×12): 81 mg via ORAL
  Filled 2024-07-29 (×12): qty 1

## 2024-07-29 MED ORDER — FUROSEMIDE 10 MG/ML IJ SOLN
60.0000 mg | Freq: Two times a day (BID) | INTRAMUSCULAR | Status: DC
  Administered 2024-07-29 – 2024-07-30 (×2): 60 mg via INTRAVENOUS
  Filled 2024-07-29 (×2): qty 6

## 2024-07-29 MED ORDER — PRAVASTATIN SODIUM 20 MG PO TABS
20.0000 mg | ORAL_TABLET | Freq: Every day | ORAL | Status: DC
  Administered 2024-07-29 – 2024-08-08 (×11): 20 mg via ORAL
  Filled 2024-07-29 (×11): qty 1

## 2024-07-29 MED ORDER — INSULIN ASPART 100 UNIT/ML IJ SOLN
0.0000 [IU] | Freq: Three times a day (TID) | INTRAMUSCULAR | Status: DC
  Administered 2024-07-29: 3 [IU] via SUBCUTANEOUS
  Administered 2024-07-30: 1 [IU] via SUBCUTANEOUS
  Administered 2024-08-01: 2 [IU] via SUBCUTANEOUS
  Administered 2024-08-02: 1 [IU] via SUBCUTANEOUS
  Administered 2024-08-03: 3 [IU] via SUBCUTANEOUS
  Administered 2024-08-04 – 2024-08-07 (×3): 1 [IU] via SUBCUTANEOUS
  Administered 2024-08-08: 2 [IU] via SUBCUTANEOUS
  Administered 2024-08-08: 1 [IU] via SUBCUTANEOUS
  Filled 2024-07-29 (×3): qty 1
  Filled 2024-07-29: qty 3
  Filled 2024-07-29: qty 2
  Filled 2024-07-29: qty 1
  Filled 2024-07-29: qty 5
  Filled 2024-07-29: qty 1
  Filled 2024-07-29: qty 2
  Filled 2024-07-29 (×2): qty 1

## 2024-07-29 MED ORDER — FUROSEMIDE 10 MG/ML IJ SOLN
40.0000 mg | Freq: Once | INTRAMUSCULAR | Status: AC
  Administered 2024-07-29: 40 mg via INTRAVENOUS
  Filled 2024-07-29: qty 4

## 2024-07-29 MED ORDER — ISOSORBIDE MONONITRATE ER 30 MG PO TB24
30.0000 mg | ORAL_TABLET | Freq: Every day | ORAL | Status: DC
  Administered 2024-07-29 – 2024-08-09 (×12): 30 mg via ORAL
  Filled 2024-07-29 (×12): qty 1

## 2024-07-29 MED ORDER — FLUTICASONE FUROATE-VILANTEROL 200-25 MCG/ACT IN AEPB
1.0000 | INHALATION_SPRAY | Freq: Every day | RESPIRATORY_TRACT | Status: AC
  Administered 2024-07-29 – 2024-08-09 (×12): 1 via RESPIRATORY_TRACT
  Filled 2024-07-29: qty 28

## 2024-07-29 MED ORDER — FUROSEMIDE 10 MG/ML IJ SOLN
20.0000 mg | Freq: Once | INTRAMUSCULAR | Status: AC
  Administered 2024-07-29: 20 mg via INTRAVENOUS
  Filled 2024-07-29: qty 4

## 2024-07-29 MED ORDER — CARVEDILOL 12.5 MG PO TABS
12.5000 mg | ORAL_TABLET | Freq: Two times a day (BID) | ORAL | Status: DC
  Administered 2024-07-29 – 2024-08-09 (×22): 12.5 mg via ORAL
  Filled 2024-07-29 (×14): qty 1
  Filled 2024-07-29: qty 2
  Filled 2024-07-29 (×7): qty 1

## 2024-07-29 MED ORDER — ALBUTEROL SULFATE (2.5 MG/3ML) 0.083% IN NEBU
2.5000 mg | INHALATION_SOLUTION | Freq: Four times a day (QID) | RESPIRATORY_TRACT | Status: DC | PRN
  Administered 2024-08-04: 2.5 mg via RESPIRATORY_TRACT
  Filled 2024-07-29: qty 3

## 2024-07-29 MED ORDER — SODIUM CHLORIDE 0.9% FLUSH
3.0000 mL | Freq: Two times a day (BID) | INTRAVENOUS | Status: DC
  Administered 2024-07-29 – 2024-07-30 (×2): 3 mL via INTRAVENOUS

## 2024-07-29 MED ORDER — HYDRALAZINE HCL 25 MG PO TABS
25.0000 mg | ORAL_TABLET | Freq: Three times a day (TID) | ORAL | Status: DC
  Administered 2024-07-29 – 2024-08-09 (×28): 25 mg via ORAL
  Filled 2024-07-29 (×30): qty 1

## 2024-07-29 MED ORDER — GUAIFENESIN ER 600 MG PO TB12
1200.0000 mg | ORAL_TABLET | Freq: Two times a day (BID) | ORAL | Status: DC
  Administered 2024-07-29 – 2024-08-09 (×23): 1200 mg via ORAL
  Filled 2024-07-29 (×23): qty 2

## 2024-07-29 MED ORDER — ACETAMINOPHEN 325 MG PO TABS
650.0000 mg | ORAL_TABLET | ORAL | Status: DC | PRN
  Administered 2024-07-29 – 2024-08-07 (×2): 650 mg via ORAL
  Filled 2024-07-29 (×2): qty 2

## 2024-07-29 MED ORDER — IPRATROPIUM-ALBUTEROL 0.5-2.5 (3) MG/3ML IN SOLN
9.0000 mL | Freq: Once | RESPIRATORY_TRACT | Status: AC
  Administered 2024-07-29: 9 mL via RESPIRATORY_TRACT
  Filled 2024-07-29: qty 3

## 2024-07-29 MED ORDER — ONDANSETRON HCL 4 MG/2ML IJ SOLN: 4.0000 mg | Freq: Four times a day (QID) | INTRAMUSCULAR | Status: DC | PRN

## 2024-07-29 NOTE — ED Triage Notes (Signed)
 Pt arrives via EMS from home for SOB and feeling sick x4 days. Per EMS, on arrival pt was sitting in bed with his CPAP on. Pt with hx of COPD and high 80s on room air saturation. Pt received duoneb with EMS. Pt arrives on 4lpm via [place] alert and able to answer questions.    CBG 164

## 2024-07-29 NOTE — Plan of Care (Signed)
  Problem: Education: Goal: Ability to describe self-care measures that may prevent or decrease complications (Diabetes Survival Skills Education) will improve Outcome: Progressing Goal: Individualized Educational Video(s) Outcome: Progressing   Problem: Coping: Goal: Ability to adjust to condition or change in health will improve Outcome: Progressing   Problem: Fluid Volume: Goal: Ability to maintain a balanced intake and output will improve Outcome: Progressing   Problem: Health Behavior/Discharge Planning: Goal: Ability to identify and utilize available resources and services will improve Outcome: Progressing Goal: Ability to manage health-related needs will improve Outcome: Progressing   Problem: Metabolic: Goal: Ability to maintain appropriate glucose levels will improve Outcome: Progressing   Problem: Nutritional: Goal: Maintenance of adequate nutrition will improve Outcome: Progressing Goal: Progress toward achieving an optimal weight will improve Outcome: Progressing   Problem: Clinical Measurements: Goal: Ability to maintain clinical measurements within normal limits will improve Outcome: Progressing Goal: Will remain free from infection Outcome: Progressing Goal: Diagnostic test results will improve Outcome: Progressing Goal: Respiratory complications will improve Outcome: Progressing Goal: Cardiovascular complication will be avoided Outcome: Progressing   Problem: Activity: Goal: Risk for activity intolerance will decrease Outcome: Progressing   Problem: Nutrition: Goal: Adequate nutrition will be maintained Outcome: Progressing   Problem: Safety: Goal: Ability to remain free from injury will improve Outcome: Progressing

## 2024-07-29 NOTE — ED Provider Notes (Signed)
 [facility] Provider Note    Event Date/Time   First MD Initiated Contact with Patient 07/29/24 [phone]     (approximate)   History   Shortness of Breath   HPI  Eric Browning is a 73 y.o. adult past medical history significant for cardiomyopathy with an EF of 40%, CAD with prior PCI, COPD, hypertension, hyperlipidemia, obesity, OSA with CPAP, who presents to the emergency department for shortness of breath.  Patient states that has not been feeling well for the past 4 days.  Worsening shortness of breath that has been increasing over the past day.  When EMS arrived he was hypoxic on room air at 88 percent and sitting on the side of the bed with his CPAP machine on.  Patient does endorse a cough but no productive cough.  No fever or chills.  Denies any chest pain.  Denies any recent falls or trauma.  Does state that he has a history of heart failure.  States that he has been compliant with his home medications but has had recent home medication changes.  No tobacco use.  No history of DVT or PE.  He is uncertain if he takes anticoagulation.  On chart review patient has recently been evaluated by his pulmonologist and COPD with severe COPD and mild LV dysfunction.  Also concern for pulmonary hypertension.  Added on another inhaler.     Physical Exam   Triage Vital Signs: ED Triage Vitals  Encounter Vitals Group     BP 07/29/24 0801 (!) 202/102     Girls Systolic BP Percentile --      Girls Diastolic BP Percentile --      Boys Systolic BP Percentile --      Boys Diastolic BP Percentile --      Pulse Rate 07/29/24 0755 92     Resp 07/29/24 0755 (!) 26     Temp 07/29/24 0755 97.7 F (36.5 C)     Temp Source 07/29/24 0755 Oral     SpO2 07/29/24 0755 (!) 89 %     Weight 07/29/24 0754 199 lb (90.3 kg)     Height 07/29/24 0754 5' 7 (1.702 m)     Head Circumference --      Peak Flow --      Pain Score 07/29/24 0753 0     Pain Loc --      Pain Education --       Exclude from Growth Chart --     Most recent vital signs: Vitals:   07/29/24 0801 07/29/24 0801  BP: (!) 202/102   Pulse:    Resp:    Temp:    SpO2:  91%    Physical Exam Constitutional:      Appearance: He is well-developed.  HENT:     Head: Atraumatic.  Eyes:     Conjunctiva/sclera: Conjunctivae normal.  Cardiovascular:     Rate and Rhythm: Regular rhythm.  Pulmonary:     Effort: Tachypnea and respiratory distress present.     Breath sounds: Wheezing and rales present.  Chest:     Chest wall: No tenderness.  Abdominal:     Palpations: Abdomen is soft.     Tenderness: There is no abdominal tenderness.  Musculoskeletal:     Cervical back: Normal range of motion.     Right lower leg: Edema present.     Left lower leg: Edema present.     Comments: +2 pitting edema to bilateral lower extremities.  No unilateral leg swelling.  Pitting edema that is mild bilateral upper extremities.  Skin:    General: Skin is warm.     Capillary Refill: Capillary refill takes less than 2 seconds.  Neurological:     General: No focal deficit present.     Mental Status: He is alert. Mental status is at baseline.     IMPRESSION / MDM / ASSESSMENT AND PLAN / ED COURSE  I reviewed the triage vital signs and the nursing notes.  Differential diagnosis including COPD exacerbation, CHF exacerbation, anemia, ACS, pneumonia, viral illness including COVID/influenza, hypercarbia, pulmonary hypertension, hypertensive emergency  On arrival afebrile, tachypneic, hypoxic to 86% on room air, improved to 91% on 4 L nasal cannula, hypertensive  Patient was placed on DuoNeb treatments and BiPAP.  Blood pressure improved to 180/90.  Appears much more comfortable.  VBG obtained after patient was on BiPAP.  EKG  I, Clotilda Punter, the attending physician, personally viewed and interpreted this ECG.  EKG with normal sinus rhythm.  Short PR interval 100.  Wandering baseline.  No significant ST elevation.   Nonspecific ST changes.  Nonspecific ST changes new when compared to prior EKG.  No tachycardic or bradycardic dysrhythmias while on cardiac telemetry.  RADIOLOGY I independently reviewed imaging, my interpretation of imaging: Chest x-ray with findings of cardiomegaly and pulmonary edema  LABS (all labs ordered are listed, but only abnormal results are displayed) Labs interpreted as -    Labs Reviewed  CBC - Abnormal; Notable for the following components:      Result Value   RBC 3.95 (*)    Hemoglobin 10.5 (*)    HCT 33.7 (*)    RDW 15.6 (*)    All other components within normal limits  PRO BRAIN NATRIURETIC PEPTIDE - Abnormal; Notable for the following components:   Pro Brain Natriuretic Peptide 7,322.0 (*)    All other components within normal limits  BLOOD GAS, VENOUS - Abnormal; Notable for the following components:   pCO2, Ven 64 (*)    pO2, Ven 48 (*)    Bicarbonate 31.5 (*)    Acid-Base Excess 3.2 (*)    All other components within normal limits  COMPREHENSIVE METABOLIC PANEL WITH GFR - Abnormal; Notable for the following components:   Sodium 147 (*)    Glucose, Bld 130 (*)    BUN 36 (*)    Creatinine, Ser 2.90 (*)    AST 11 (*)    GFR, Estimated 22 (*)    All other components within normal limits  TROPONIN T, HIGH SENSITIVITY - Abnormal; Notable for the following components:   Troponin T High Sensitivity 39 (*)    All other components within normal limits  RESP PANEL BY RT-PCR (RSV, FLU A&B, COVID)  [id number]  PROTIME-INR     MDM  No significant leukocytosis or anemia.  Platelets are at his normal.  Creatinine elevated to 2.9, most recent creatinine was 3 but previously had been around 2.5.  BUN without significant elevation.  Mild hyponatremia at 147 likely in the setting of hypervolemia troponin mildly elevated at 39, likely demand ischemia in the setting of heart failure exacerbation  Patient appears much more comfortable on BiPAP.  Given IV Lasix.  Blood  pressure improved while on BiPAP.  No active chest pain at this time.  Consulted hospitalist for admission for acute hypoxic hypercarbic respiratory failure in the setting of COPD and CHF exacerbation     PROCEDURES:  Critical Care performed: yes  .  Critical Care  Performed by: Suzanne Kirsch, MD Authorized by: Suzanne Kirsch, MD   Critical care provider statement:    Critical care time (minutes):  30   Critical care time was exclusive of:  Separately billable procedures and treating other patients   Critical care was necessary to treat or prevent imminent or life-threatening deterioration of the following conditions:  Respiratory failure   Critical care was time spent personally by me on the following activities:  Development of treatment plan with patient or surrogate, discussions with consultants, evaluation of patient's response to treatment, examination of patient, ordering and review of laboratory studies, ordering and review of radiographic studies, ordering and performing treatments and interventions, pulse oximetry, re-evaluation of patient's condition and review of old charts   Care discussed with: admitting provider     Patient's presentation is most consistent with acute presentation with potential threat to life or bodily function.   MEDICATIONS ORDERED IN ED: Medications  furosemide (LASIX) injection 40 mg (has no administration in time range)  ipratropium-albuterol  (DUONEB) 0.5-2.5 (3) MG/3ML nebulizer solution 9 mL (9 mLs Nebulization Given 07/29/24 0815)  methylPREDNISolone sodium succinate (SOLU-MEDROL) 125 mg/2 mL injection 125 mg (125 mg Intravenous Given 07/29/24 0815)    FINAL CLINICAL IMPRESSION(S) / ED DIAGNOSES   Final diagnoses:  Hypoxia  SOB (shortness of breath)  Acute on chronic systolic congestive heart failure (HCC)  Acute respiratory failure with hypoxia and hypercarbia (HCC)     Rx / DC Orders   ED Discharge Orders     None        Note:   This document was prepared using Dragon voice recognition software and may include unintentional dictation errors.   Suzanne Kirsch, MD 07/29/24 [phone]

## 2024-07-29 NOTE — Telephone Encounter (Signed)
 Patient Product/process Development Scientist completed.    The patient is insured through [facility]. Patient has Medicare and is not eligible for a copay card, but may be able to apply for patient assistance or Medicare RX Payment Plan (Patient Must reach out to their plan, if eligible for payment plan), if available.    Ran test claim for Breo Ellpta 200-25 mcg and the current 30 day co-pay is $302.00 due to a $255.00 deductible.   This test claim was processed through [facility]- copay amounts may vary at other pharmacies due to pharmacy/plan contracts, or as the patient moves through the different stages of their insurance plan.     Reyes Sharps, CPHT Pharmacy Technician Patient Advocate Specialist [facility] Patient Advocate Team Direct Number: [phone]  Fax: [phone]

## 2024-07-29 NOTE — H&P (Signed)
 History and Physical    Eric Browning FMW:[id number] DOB: Feb 22, 1951 DOA: 07/29/2024  PCP: Alla Amis, MD (Confirm with patient/family/NH records and if not entered, this has to be entered at [facility] point of entry) Patient coming from: Home  I have personally briefly reviewed patient's old medical records in [facility]  Chief Complaint: SOB  HPI: Eric Browning is a 73 y.o. adult with medical history significant of CAD status post PCI and stenting, chronic HFrEF with LVEF 44%, severe pulmonary hypertension, COPD Gold stage IV, CKD stage IV, OSA on CPAP at bedtime, presented with worsening of cough wheezing shortness of breath.  Symptoms started 9 to 10 days ago when patient started to have increasing exertional dyspnea, cough with occasional clear phlegm, and wheezing.  Last several days, even walking inside his home bedroom causing significant shortness of breath.  For which patient has been using his nocturnal CPAP at daytime to compensate with some relief.  He however did not increase his torsemide  dosage.  He denied any chest pain, no fever or chills.  Over the weekend he has also had decreased appetite but denied any nauseous vomiting, no abdominal pain or diarrhea.  Denied any urinary symptoms.  ED Course: Afebrile, heart rate 80s, blood pressure 180/90 O2 saturation 89% on 2 L.  O2 saturation 98% on BiPAP with FiO2 35%.  Chest x-ray showed pulmonary congestion bilaterally, blood work showed VBG 7.30/64/48, WBC 7.1 hemoglobin 10.5 BUN 36 creatinine 2.9 sodium 147 potassium 4.1 bicarb 28.  Patient was given IV Lasix 40 mg, IV Solu-Medrol, DuoNebs x 1 in the ED.  Review of Systems: As per HPI otherwise 14 point review of systems negative.    Past Medical History:  Diagnosis Date   Angina at rest    Arthritis of both hands    Bilateral cataracts    Bilateral renal cysts    BPH (benign prostatic hyperplasia)    CAD (coronary artery disease)    a.) LHC/PCI 12/31/2020: 90% dRCA  (3.0 x 15 mm Resolute Onyx DES); 50% pRCA, minor lum irregs in LAD and LCx   Cardiomegaly    Cardiomyopathy (HCC)    a.) TTE 07/03/2015: EF 50%; b.) MV 07/03/2015: EF 53%; c.) TTE 01/08/2019: EF 45%; d.) MV 01/08/2019: EF 45-50%; e.) TTE 11/26/2020: EF 45%; f.) MV 11/26/2020: EF 51%; g.) TTE 03/06/2023: EF 40%   CKD (chronic kidney disease), stage III (HCC)    CVA (cerebral vascular accident) (HCC)    a.) noted on CT head 03/12/2017: RIGHT posterior  parietal encephalomalacia consistent with small chronic temporoparietal infarct   DDD (degenerative disc disease), cervical    Dyspnea on exertion    GERD (gastroesophageal reflux disease)    Gout    Heart murmur    HFrEF (heart failure with reduced ejection fraction) (HCC)    a.) TTE 07/03/2015: EF 50%, mild-mod LVH, inf/post HK, G1DD, triv MR/PR, mild AR/TR, RVSP 36.8; b.) TTE 01/08/2019: EF 45%, sep/apical HK, mild LVH, mild LAE, mild panval regurg, RVSP 37.2; d.) TTE 11/26/2020: EF 45%, apical.inf/post HK, G1DD, mild LAE/LVE, triv AR/PR, mild MR/TR, RVSP 52.9; e.)  TTE 03/06/2023: EF 40%, sep/apical/inf HK, sev LAE, mild AR/TR/PR, mod MR, RVSP 35.3   Hiatal hernia    High cholesterol    Hypertension    IDA (iron deficiency anemia)    Left renal mass 12/13/2022   a.) US  renal 12/13/2022: 2.7 x 2.4 x 2.5 cm; b.) MRI abdomen 12/22/2022: 2.9 x 2.6 x 2.4 cm LEFT anterior upper  kidney; c.) MRI abdomen 06/14/2023: interval increase in size to 2.8 x 3.2 cm --> 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   Long term current use of aspirin     OSA on CPAP    Primary hypogonadism in male    Stenosis of left vertebral artery (severe)    T2DM (type 2 diabetes mellitus) (HCC)     Past Surgical History:  Procedure Laterality Date   CATARACT EXTRACTION W/ INTRAOCULAR LENS IMPLANT Right 2013   COLONOSCOPY     COLONOSCOPY N/A 04/06/2022   Procedure: COLONOSCOPY;  Surgeon: Toledo, Ladell POUR, MD;  Location: [facility] ENDOSCOPY;  Service: Gastroenterology;   Laterality: N/A;   COLONOSCOPY WITH PROPOFOL  N/A 08/03/2015   Procedure: COLONOSCOPY WITH PROPOFOL ;  Surgeon: Lamar ONEIDA Holmes, MD;  Location: [facility] ENDOSCOPY;  Service: Endoscopy;  Laterality: N/A;   CORONARY STENT INTERVENTION N/A 12/31/2020   Procedure: CORONARY STENT INTERVENTION;  Surgeon: Florencio Cara BIRCH, MD;  Location: [facility] INVASIVE CV LAB;  Service: Cardiovascular;  Laterality: N/A;  RCA   EUS N/A 09/03/2015   Procedure: LOWER ENDOSCOPIC ULTRASOUND (EUS);  Surgeon: Asberry DELENA Coffee, MD;  Location: [facility] ENDOSCOPY;  Service: Endoscopy;  Laterality: N/A;   IR RADIOLOGIST EVAL & MGMT  08/24/2023   IR RADIOLOGIST EVAL & MGMT  09/28/2023   IR RADIOLOGIST EVAL & MGMT  06/06/2024   LEFT HEART CATH AND CORONARY ANGIOGRAPHY N/A 12/31/2020   Procedure: LEFT HEART CATH AND CORONARY ANGIOGRAPHY;  Surgeon: Florencio Cara BIRCH, MD;  Location: [facility] INVASIVE CV LAB;  Service: Cardiovascular;  Laterality: N/A;   REVERSE SHOULDER ARTHROPLASTY Right 12/12/2017   Procedure: REVERSE SHOULDER ARTHROPLASTY;  Surgeon: Edie Norleen PARAS, MD;  Location: [facility] ORS;  Service: Orthopedics;  Laterality: Right;   RIGHT HEART CATH Right 06/12/2024   Procedure: RIGHT HEART CATH;  Surgeon: Florencio Cara BIRCH, MD;  Location: [facility] INVASIVE CV LAB;  Service: Cardiovascular;  Laterality: Right;   TONSILLECTOMY     TOTAL KNEE ARTHROPLASTY Right 05/12/2016   Procedure: TOTAL KNEE ARTHROPLASTY;  Surgeon: Norleen PARAS Edie, MD;  Location: [facility] ORS;  Service: Orthopedics;  Laterality: Right;     reports that he quit smoking about 35 years ago. His smoking use included cigarettes. He started smoking about 65 years ago. He has a 30 pack-year smoking history. He has never used smokeless tobacco. He reports that he does not drink alcohol and does not use drugs.  No Known Allergies  Family History  Problem Relation Age of Onset   Heart disease Mother    Stroke Father    Hypertension Father    Gout Father    Cancer Brother    Bone  cancer Son    Brain cancer Son     Prior to Admission medications   Medication Sig Start Date End Date Taking? Authorizing Provider  albuterol  (VENTOLIN  HFA) 108 (90 Base) MCG/ACT inhaler Inhale 1-2 puffs into the lungs every 6 (six) hours as needed for wheezing or shortness of breath.   Yes [provider]  allopurinol  (ZYLOPRIM ) 100 MG tablet Take 100 mg by mouth daily.   Yes [provider]  amLODipine  (NORVASC ) 5 MG tablet Take 5 mg by mouth daily. 05/07/24  Yes [provider]  aspirin  EC 81 MG tablet Take 81 mg by mouth daily. Swallow whole.   Yes [provider]  carvedilol (COREG) 12.5 MG tablet Take 12.5 mg by mouth 2 (two) times daily.   Yes [provider]  cyanocobalamin  (VITAMIN B12) 500 MCG tablet Take 500  mcg by mouth daily.   Yes [provider]  dapagliflozin propanediol (FARXIGA) 10 MG TABS tablet Take 10 mg by mouth daily. 06/18/24 06/18/25 Yes [provider]  ezetimibe (ZETIA) 10 MG tablet Take 10 mg by mouth daily.   Yes [provider]  Fe Bisgly-Vit C-Vit B12-FA (GENTLE IRON PO) Take 2 tablets by mouth daily.   Yes [provider]  hydrALAZINE  (APRESOLINE ) 25 MG tablet Take 25 mg by mouth.  Take 1 tablet (25 mg total) by mouth in the morning and 1 tablet (25 mg total) at noon and 1 tablet (25 mg total) in the evening. 06/18/24 06/18/25 Yes [provider]  hydrochlorothiazide  (HYDRODIURIL ) 25 MG tablet Take 25 mg by mouth daily.   Yes [provider]  metoprolol  succinate (TOPROL -XL) 25 MG 24 hr tablet Take 25 mg by mouth daily. 05/13/24 05/13/25 Yes [provider]  OHTUVAYRE  3 MG/2.5ML SUSP  07/22/24  Yes [provider]  olmesartan  (BENICAR ) 40 MG tablet Take 40 mg by mouth daily. 12/07/20  Yes [provider]  potassium chloride  SA (K-DUR,KLOR-CON ) 20 MEQ tablet Take 20 mEq by mouth 3 (three) times daily.    Yes [provider]  pravastatin   (PRAVACHOL ) 20 MG tablet Take 20 mg by mouth daily. 05/25/23  Yes [provider]  tamsulosin (FLOMAX) 0.4 MG CAPS capsule Take 0.4 mg by mouth daily. 09/17/20  Yes [provider]  torsemide  (DEMADEX ) 20 MG tablet Take 40 mg by mouth daily. 06/06/24  Yes [provider]  amLODipine  (NORVASC ) 10 MG tablet Take 1 tablet (10 mg total) by mouth daily. Patient not taking: Reported on 07/29/2024 01/02/21   Callwood, Dwayne D, MD  clopidogrel (PLAVIX) 75 MG tablet Take 75 mg by mouth once. Patient not taking: Reported on 07/29/2024    [provider]  Colchicine  0.6 MG CAPS Take 1 capsule by mouth daily. Patient not taking: Reported on 07/29/2024    [provider]  metFORMIN  (GLUCOPHAGE -XR) 500 MG 24 hr tablet Take 500 mg by mouth daily. Patient not taking: Reported on 07/29/2024 07/01/24   [provider]  NON FORMULARY Pt uses a cpap nightly    [provider]    Physical Exam: Vitals:   07/29/24 0900 07/29/24 0915 07/29/24 0930 07/29/24 0945  BP: (!) 177/97 (!) 182/89 (!) 170/94 (!) 187/90  Pulse: 86 80 88 87  Resp: (!) 26 18 20  (!) 21  Temp:      TempSrc:      SpO2: 100% 98% 98% 98%  Weight:      Height:        Constitutional: NAD, calm, comfortable Vitals:   07/29/24 0900 07/29/24 0915 07/29/24 0930 07/29/24 0945  BP: (!) 177/97 (!) 182/89 (!) 170/94 (!) 187/90  Pulse: 86 80 88 87  Resp: (!) 26 18 20  (!) 21  Temp:      TempSrc:      SpO2: 100% 98% 98% 98%  Weight:      Height:       Eyes: PERRL, lids and conjunctivae normal ENMT: Mucous membranes are moist. Posterior pharynx clear of any exudate or lesions.Normal dentition.  Neck: normal, supple, no masses, no thyromegaly, JVD, 6 cm above the clavicles Respiratory: Diminished breathing sound bilaterally, scattered wheezing, diffuse bilateral crackles, increasing breathing effort, spoke in broken sentences, positive accessory muscle use.  Cardiovascular: Regular rate and  rhythm, no murmurs / rubs / gallops.  2+ extremity edema. 2+ pedal pulses. No carotid bruits.  Abdomen: no tenderness, no masses palpated. No hepatosplenomegaly. Bowel sounds positive.  Musculoskeletal: no clubbing / cyanosis. No joint deformity upper and lower extremities. Good ROM, no contractures. Normal muscle tone.  Skin: no rashes, lesions, ulcers. No induration Neurologic: CN 2-12 grossly intact. Sensation intact, DTR normal. Strength 5/5 in all 4.  Psychiatric: Normal judgment and insight. Alert and oriented x 3. Normal mood.     Labs on Admission: I have personally reviewed following labs and imaging studies  CBC: Recent Labs  Lab 07/29/24 0800  WBC 7.1  HGB 10.5*  HCT 33.7*  MCV 85.3  PLT 343   Basic Metabolic Panel: Recent Labs  Lab 07/29/24 0800  NA 147*  K 4.1  CL 109  CO2 28  GLUCOSE 130*  BUN 36*  CREATININE 2.90*  CALCIUM  9.4   GFR: Estimated Creatinine Clearance (by C-G formula based on SCr of 2.9 mg/dL (H)) Male: 80.6 mL/min (A) Male: 23.6 mL/min (A) Liver Function Tests: Recent Labs  Lab 07/29/24 0800  AST 11*  ALT 7  ALKPHOS 98  BILITOT 0.3  PROT 7.1  ALBUMIN 4.2   No results for input(s): LIPASE, AMYLASE in the last 168 hours. No results for input(s): AMMONIA in the last 168 hours. Coagulation Profile: Recent Labs  Lab 07/29/24 0800  INR 1.1   Cardiac Enzymes: No results for input(s): CKTOTAL, CKMB, CKMBINDEX, TROPONINI in the last 168 hours. BNP (last 3 results) Recent Labs    07/29/24 0800  PROBNP 7,322.0*   HbA1C: No results for input(s): HGBA1C in the last 72 hours. CBG: No results for input(s): GLUCAP in the last 168 hours. Lipid Profile: No results for input(s): CHOL, HDL, LDLCALC, TRIG, CHOLHDL, LDLDIRECT in the last 72 hours. Thyroid Function Tests: No results for input(s): TSH, T4TOTAL, FREET4, T3FREE, THYROIDAB in the last 72 hours. Anemia Panel: No results for input(s):  VITAMINB12, FOLATE, FERRITIN, TIBC, IRON, RETICCTPCT in the last 72 hours. Urine analysis:    Component Value Date/Time   COLORURINE YELLOW (A) 12/04/2017 0913   APPEARANCEUR CLEAR (A) 12/04/2017 0913   LABSPEC 1.016 12/04/2017 0913   PHURINE 6.0 12/04/2017 0913   GLUCOSEU NEGATIVE 12/04/2017 0913   HGBUR NEGATIVE 12/04/2017 0913   BILIRUBINUR NEGATIVE 12/04/2017 0913   KETONESUR NEGATIVE 12/04/2017 0913   PROTEINUR 30 (A) 12/04/2017 0913   NITRITE NEGATIVE 12/04/2017 0913   LEUKOCYTESUR NEGATIVE 12/04/2017 0913    Radiological Exams on Admission: DG Chest Portable 1 View Result Date: 07/29/2024 CLINICAL DATA:  dyspnea EXAM: PORTABLE CHEST - 1 VIEW COMPARISON:  12/09/2016 FINDINGS: Lower lung volumes. Bilateral perihilar interstitial opacities with patchy opacities in the lung bases. No pneumothorax. Blunting of both costophrenic sulci, likely trace effusions. Moderate cardiomegaly.Tortuous aorta with aortic atherosclerosis.No acute fracture or destructive lesion. IMPRESSION: Moderate cardiomegaly. Low lung volumes with findings of either asymmetric pulmonary edema or aspiration. Trace bilateral pleural effusions Electronically Signed   By: Rogelia Myers M.D.   On: 07/29/2024 08:52    EKG: Independently reviewed.  Sinus rhythm, incomplete LBBB chronic.  Assessment/Plan Principal Problem:   CHF (congestive heart failure) (HCC) Active Problems:   Acute on chronic systolic CHF (congestive heart failure) (HCC)  (please populate well all problems here in Problem List. (For example, if patient is on BP meds at home and you resume or decide to hold them, it is a problem that needs to be her. Same for CAD, COPD, HLD and so on)  Acute on chronic hypoxic respiratory failure Acute on chronic HFrEF decompensation Hypertension  emergency with acute endorgan damage of acute CHF decompensation - Continue aggressive IV diuresis Lasix  60 mg IV twice daily - Bring down BP aggressively  with resuming home BP meds, hold off HCTZ and ARB, continue hydralazine , add Imdur , continue Coreg  - Patient underwent extensive cardiac workup recently including cardiac MRI, RHC and LHC, which showed LVEF 44%.  Will not order echo at this time. - Admit to PCU to titrate BiPAP.  Acute COPD exacerbation - Continue IV Solu-Medrol  - Add LABA plus ICS - Continue DuoNebs and as needed albuterol  - Incentive spirometry  Acute decompensated metabolic acidosis - 1 dose of Diamox  - Treat COPD exacerbation as above  CKD stage IV - Creatinine level stable, severely fluid overloaded - Diuresis as above - Daily BMP  IIDM - Hold off Jardiance and metformin  - Start SSI  CAD - No acute concern - Continue aspirin  and Pravachol   DVT prophylaxis: Lovenox  Code Status: Full code Family Communication: A close friend at bedside Disposition Plan: Patient is sick with CHF decompensation requiring BiPAP and IV diuresis, complicated with baseline CKD, recurrent monitoring kidney function closely every day, expect more than 2 midnight hospital stay Consults called: None Admission status: PCU admit   Cort ONEIDA Mana MD [facility] Pager [phone]  07/29/2024, 10:55 AM

## 2024-07-30 ENCOUNTER — Inpatient Hospital Stay

## 2024-07-30 ENCOUNTER — Other Ambulatory Visit (HOSPITAL_COMMUNITY): Payer: Self-pay

## 2024-07-30 ENCOUNTER — Encounter: Payer: Self-pay | Admitting: Internal Medicine

## 2024-07-30 ENCOUNTER — Telehealth (HOSPITAL_COMMUNITY): Payer: Self-pay | Admitting: Pharmacy Technician

## 2024-07-30 DIAGNOSIS — J441 Chronic obstructive pulmonary disease with (acute) exacerbation: Secondary | ICD-10-CM

## 2024-07-30 DIAGNOSIS — N179 Acute kidney failure, unspecified: Secondary | ICD-10-CM

## 2024-07-30 LAB — BASIC METABOLIC PANEL WITH GFR
Anion gap: 11 (ref 5–15)
BUN: 51 mg/dL — ABNORMAL HIGH (ref 8–23)
CO2: 28 mmol/L (ref 22–32)
Calcium: 8.7 mg/dL — ABNORMAL LOW (ref 8.9–10.3)
Chloride: 106 mmol/L (ref 98–111)
Creatinine, Ser: 3.54 mg/dL — ABNORMAL HIGH (ref 0.61–1.24)
GFR, Estimated: 17 mL/min — ABNORMAL LOW (ref >60)
Glucose, Bld: 133 mg/dL — ABNORMAL HIGH (ref 70–99)
Potassium: 4.1 mmol/L (ref 3.5–5.1)
Sodium: 145 mmol/L (ref 135–145)

## 2024-07-30 LAB — GLUCOSE, CAPILLARY
Glucose-Capillary: 111 mg/dL — ABNORMAL HIGH (ref 70–99)
Glucose-Capillary: 127 mg/dL — ABNORMAL HIGH (ref 70–99)
Glucose-Capillary: 129 mg/dL — ABNORMAL HIGH (ref 70–99)

## 2024-07-30 MED ORDER — IPRATROPIUM-ALBUTEROL 0.5-2.5 (3) MG/3ML IN SOLN
3.0000 mL | Freq: Two times a day (BID) | RESPIRATORY_TRACT | Status: DC
  Administered 2024-07-31 – 2024-08-04 (×9): 3 mL via RESPIRATORY_TRACT
  Filled 2024-07-30 (×9): qty 3

## 2024-07-30 MED ORDER — HYDRALAZINE HCL 20 MG/ML IJ SOLN
5.0000 mg | Freq: Four times a day (QID) | INTRAMUSCULAR | Status: AC | PRN
  Filled 2024-07-30: qty 1

## 2024-07-30 MED ORDER — AMLODIPINE BESYLATE 5 MG PO TABS
5.0000 mg | ORAL_TABLET | Freq: Every day | ORAL | Status: DC
  Administered 2024-07-31 – 2024-08-09 (×8): 5 mg via ORAL
  Filled 2024-07-30 (×9): qty 1

## 2024-07-30 NOTE — Assessment & Plan Note (Signed)
 Home amlodipine  5 mg daily, carvedilol 12.5 mg p.o. twice daily, hydralazine  25 mg every 8 hours, Imdur 30 mg daily were resumed on admission Hydralazine  5 mg IV every 6 hours as needed for SBP greater 170, 5 days ordered

## 2024-07-30 NOTE — Progress Notes (Signed)
 Heart Failure Stewardship Pharmacy Note  PCP: Alla Amis, MD PCP-Cardiologist: None  HPI: Eric Browning is a 73 y.o. adult with CAD status post PCI and stenting, CHF, severe pulmonary hypertension, COPD Gold stage IV, CKD stage IV, OSA on CPAP at bedtime, and T2DM who presented with worsening shortness of breath accompanied by cough and wheezing. On admission, proBNP was 7322, HS-troponin was 39. Chest x-ray noted asymmetric pulmonary edema vs aspiration and trace bilateral effusions.   Pertinent cardiac history: LHV 12/2020 noted 50% proximal and 90% distal RCA stenosis treated with DES. RHC 05/2024 noted preserved CO of 5.6, CI 2.9, PCWP of 41, RA of 25, and mPAP of 57. CMRI 05/2024 noted LVEF of 44%, LGE of basal-mid inferolateral and inferior LV myocardium, no evidence of infiltrative disease.   Pertinent Lab Values: Creatinine  Date Value Ref Range Status  04/24/2024 3.00 (H) 0.61 - 1.24 mg/dL Final   Creatinine, Ser  Date Value Ref Range Status  07/30/2024 3.54 (H) 0.61 - 1.24 mg/dL Final   BUN  Date Value Ref Range Status  07/30/2024 51 (H) 8 - 23 mg/dL Final   Potassium  Date Value Ref Range Status  07/30/2024 4.1 3.5 - 5.1 mmol/L Final  10/31/2011 3.6 3.5 - 5.1 mmol/L Final   Sodium  Date Value Ref Range Status  07/30/2024 145 135 - 145 mmol/L Final   B Natriuretic Peptide  Date Value Ref Range Status  12/01/2020 131.9 (H) 0.0 - 100.0 pg/mL Final    Comment:    Performed at [facility], [address] [place], [place] [address]   Hgb A1c MFr Bld  Date Value Ref Range Status  07/29/2024 5.5 4.8 - 5.6 % Final    Comment:    (NOTE)         Prediabetes: 5.7 - 6.4         Diabetes: >6.4         Glycemic control for adults with diabetes: <7.0    LDH  Date Value Ref Range Status  05/09/2023 78 (L) 98 - 192 U/L Final    Comment:    Performed at [facility], [address] [place], [place] [address]    Vital Signs: Temp:  [97.6 F  (36.4 C)-98.6 F (37 C)] 97.9 F (36.6 C) (12/02 0740) Pulse Rate:  [60-95] 85 (12/02 0740) Cardiac Rhythm: Atrial fibrillation (12/01 1900) Resp:  [13-30] 18 (12/02 0740) BP: (106-214)/(59-107) 158/77 (12/02 0740) SpO2:  [89 %-100 %] 98 % (12/02 0740) Weight:  [81.6 kg (180 lb)] 81.6 kg (180 lb) (12/02 0500)  Intake/Output Summary (Last 24 hours) at 07/30/2024 0845 Last data filed at 07/30/2024 9247 Gross per 24 hour  Intake --  Output 1475 ml  Net -1475 ml    Current Heart Failure Medications:  Loop diuretic: none Beta-Blocker: carvedilol  12.5 mg BID ACEI/ARB/ARNI: none MRA: none SGLT2i: none Other: hydralazine  25 mg TID and Imdur  30 mg daily  Prior to admission Heart Failure Medications:  Loop diuretic: torsemide  40 mg daily Beta-Blocker: carvedilol  12.5 mg BID ACEI/ARB/ARNI: olmesartan  40 mg daily MRA: none SGLT2i: none Other: hydralazine  25 mg TID  Assessment: 1. Acute on chronic systolic heart failure (LVEF 44%) due to mixed ICM and NICM. NYHA class III symptoms.  -Symptoms: Reports feeling improved, though still with some shortness of breath and fatigue. Dyspnea likely due to COPD and CHF. -Volume: Volume is difficult to assess. BNP elevated on admission. Reports minimal urine output. Furosemide  held for AKI. -Hemodynamics: BP is  elevated. HR 80s. -BB: Continue carvedilol 12.5 mg BID. Can consider increasing to 25 mg BID prior to discharge. -ACEI/ARB/ARNI: Home olmesartan  held for AKI. -MRA: Not a candidate for MRA at this time due to CKD. -SGLT2i: Would benefit from starting SGLT2i for benefit to CHF, CKD, and DM, however eGFR at this time is too low to initiate. Would consider starting if eGFR improves to ~25 ml.min. - Currently on amlodipine  for BP. Would consider optimizing BiDil before adding amlodipine . Consider stopping hydralazine /Imdur and amlodipine  and adding BiDil 1 tablet TID. -Patient taking sildenafil PTA, this can not be continued with nitrates for  risk of hypotension.   Plan: 1) Medication changes recommended at this time: -Consider stopping hyalazine/Imdur/amlodipine  and adding BiDil 1 tablet TID.   2) Patient assistance: -Pending  3) Education: - Patient has been educated on current HF medications and potential additions to HF medication regimen - Patient verbalizes understanding that over the next few months, these medication doses may change and more medications may be added to optimize HF regimen - Patient has been educated on basic disease state pathophysiology and goals of therapy  Medication Assistance / Insurance Benefits Check: Does the patient have prescription insurance?    Please do not hesitate to reach out with questions or concerns,  Jaun Bash, PharmD, CPP, BCPS, [facility] Heart Failure Pharmacist  Phone - [phone] 07/30/2024 11:29 AM

## 2024-07-30 NOTE — Plan of Care (Signed)
  Problem: Fluid Volume: Goal: Ability to maintain a balanced intake and output will improve Outcome: Progressing   Problem: Education: Goal: Ability to describe self-care measures that may prevent or decrease complications (Diabetes Survival Skills Education) will improve Outcome: Progressing Goal: Individualized Educational Video(s) Outcome: Progressing   Problem: Health Behavior/Discharge Planning: Goal: Ability to identify and utilize available resources and services will improve Outcome: Progressing Goal: Ability to manage health-related needs will improve Outcome: Progressing   Problem: Clinical Measurements: Goal: Ability to maintain clinical measurements within normal limits will improve Outcome: Progressing Goal: Will remain free from infection Outcome: Progressing Goal: Diagnostic test results will improve Outcome: Progressing Goal: Respiratory complications will improve Outcome: Progressing Goal: Cardiovascular complication will be avoided Outcome: Progressing

## 2024-07-30 NOTE — Assessment & Plan Note (Signed)
 Home ezetimibe 10 mg daily, pravastatin  20 mg nightly

## 2024-07-30 NOTE — Assessment & Plan Note (Signed)
 Continue DuoNebs every 6 hours Continue home maintenance inhaler 1 puff daily Albuterol  nebulizer every 6 hours.  For wheezing and shortness of breath

## 2024-07-30 NOTE — Assessment & Plan Note (Signed)
 Home allopurinol  100 mg daily

## 2024-07-30 NOTE — Telephone Encounter (Signed)
 Patient Product/process Development Scientist completed.    The patient is insured through [facility]. Patient has Medicare and is not eligible for a copay card, but may be able to apply for patient assistance or Medicare RX Payment Plan (Patient Must reach out to their plan, if eligible for payment plan), if available.    Ran test claim for Farxiga 10 mg and the current 30 day co-pay is $302.00 due to a $255.00 deductible.  Ran test claim for Jardiance 10 mg and the current 30 day co-pay is $302.00 due to a $255.00 deductible.  Ran test claim for isosorbide-hydralazine  20-37.5 mg and the current 30 day co-pay is $79.50     This test claim was processed through [employer]- copay amounts may vary at other pharmacies due to [employer], or as the patient moves through the different stages of their insurance plan.     Reyes Sharps, CPHT Pharmacy Technician Patient Advocate Specialist Lead [facility] Patient Advocate Team Direct Number: [phone]  Fax: [phone]

## 2024-07-30 NOTE — Assessment & Plan Note (Signed)
 Strict I's and O's Holding home furosemide at this time due to renal injury that I suspect is from overdiuresis Continue carvedilol 12.5 mg p.o. twice daily

## 2024-07-30 NOTE — Assessment & Plan Note (Signed)
 Home metformin  will not be resumed on admission Insulin  SSI ordered on admission

## 2024-07-30 NOTE — Progress Notes (Addendum)
 PROGRESS NOTE  Eric Browning  FMW:[id number] DOB: September 26, 1950 DOA: 07/29/2024 PCP: Alla Amis, MD   [name] is a 73 year old male with history of hypertension, gout, heart failure reduced ejection fraction, non-insulin -dependent diabetes mellitus type 2, severe COPD, pulmonary hypertension, hyperlipidemia, BPH, who presents ED for chief concerns of shortness of breath.  12/1: Patient presented via EMS and per EDP note, patient was hypoxic on room air at 88% and sitting on the side of the bed with his CPAP machine on.  12/1: Patient admitted to [facility] for acute on chronic hypoxic respiratory failure suspect secondary to heart failure decompensation.  Patient was treated with aggressive IV diuresis, furosemide 60 mg IV twice daily.  Patient also noted to have suspected acute COPD exacerbation.  12/2: Assumed care of the patient. Renal function noted to be worsening.  I discontinued furosemide 60 mg IV twice daily initiated on admission.  Nephrology has been consulted via Epic order.  Ultrasound of the kidney has been ordered.  Assessment & Plan:   Principal Problem:   CHF (congestive heart failure) (HCC) Active Problems:   AKI (acute kidney injury)   COPD with acute exacerbation (HCC)   Essential hypertension   Symptomatic anemia   Primary male hypogonadism   Pure hypercholesterolemia   Type 2 diabetes mellitus without complication, without long-term current use of insulin  (HCC)   Personal history of gout   Assessment and Plan:  * CHF (congestive heart failure) (HCC) Strict I's and O's Holding home furosemide at this time due to renal injury that I suspect is from overdiuresis Continue carvedilol 12.5 mg p.o. twice daily  COPD with acute exacerbation (HCC) Continue DuoNebs every 6 hours Continue home maintenance inhaler 1 puff daily Albuterol  nebulizer every 6 hours.  For wheezing and shortness of breath  AKI (acute kidney injury) Query secondary  to overdiuresis Discontinue furosemide 60 mg IV twice daily Continue to hold hydrochlorothiazide , olmesartan  40 mg daily A.m. team to resume when benefits outweigh the risk Nephrology has been consulted via epic order to Dr. Marcelino Ultrasound of the renals ordered  Essential hypertension Home amlodipine  5 mg daily, carvedilol 12.5 mg p.o. twice daily, hydralazine  25 mg every 8 hours, Imdur 30 mg daily were resumed on admission Hydralazine  5 mg IV every 6 hours as needed for SBP greater 170, 5 days ordered  Type 2 diabetes mellitus without complication, without long-term current use of insulin  (HCC) Home metformin  will not be resumed on admission Insulin  SSI ordered on admission  Pure hypercholesterolemia Home ezetimibe 10 mg daily, pravastatin  20 mg nightly  Personal history of gout Home allopurinol  100 mg daily  DVT prophylaxis: Hep 5000 units subcutaneous every 12 hours Code Status: Full code Family Communication: Updated charge friend at bedside with patient's permission Disposition Plan: Pending clinical course Level of care: Progressive  Consultants:  Nephrology  Procedures:  None at this time  Antimicrobials: None indicated  Subjective:  At bedside, patient was able to tell me his first and last name, age, location, current calendar year.  He does not appear to be in acute distress.  He reports he has been urinating a lot.  Reports his appetite has been good.  He denies chest pain, shortness of breath, dysuria, hematuria, diarrhea.  Denies nausea, vomiting.  Objective: Vitals:   07/30/24 0500 07/30/24 0740 07/30/24 1140 07/30/24 1715  BP:  (!) 158/77 129/66 139/81  Pulse:  85 76 88  Resp:  18 16 20   Temp:  97.9 F (36.6 C)  97.9 F (36.6 C) 97.9 F (36.6 C)  TempSrc:   Oral   SpO2:  98% 100% 99%  Weight: 81.6 kg     Height:        Intake/Output Summary (Last 24 hours) at 07/30/2024 1728 Last data filed at 07/30/2024 1425 Gross per 24 hour  Intake --   Output 1250 ml  Net -1250 ml   Filed Weights   07/29/24 0754 07/30/24 0500  Weight: 90.3 kg 81.6 kg   Examination:  General exam: Appears calm and comfortable  Respiratory system: Clear to auscultation. Respiratory effort normal. Cardiovascular system: S1 & S2 heard, RRR. No JVD, murmurs, rubs, gallops or clicks. No pedal edema. Gastrointestinal system: Obese abdomen, abdomen is nondistended, soft and nontender. No organomegaly or masses felt. Normal bowel sounds heard. Central nervous system: Alert and oriented. No focal neurological deficits. Extremities: Symmetric 5 x 5 power. Skin: No rashes, lesions or ulcers Psychiatry: Judgement and insight appear normal. Mood & affect appropriate.   Data Reviewed: I have personally reviewed following labs and imaging studies  CBC: Recent Labs  Lab 07/29/24 0800  WBC 7.1  HGB 10.5*  HCT 33.7*  MCV 85.3  PLT 343   Basic Metabolic Panel: Recent Labs  Lab 07/29/24 0800 07/30/24 0437  NA 147* 145  K 4.1 4.1  CL 109 106  CO2 28 28  GLUCOSE 130* 133*  BUN 36* 51*  CREATININE 2.90* 3.54*  CALCIUM  9.4 8.7*   GFR: Estimated Creatinine Clearance (by C-G formula based on SCr of 3.54 mg/dL (H)) Male: 84.8 mL/min (A) Male: 18.4 mL/min (A)  Liver Function Tests: Recent Labs  Lab 07/29/24 0800  AST 11*  ALT 7  ALKPHOS 98  BILITOT 0.3  PROT 7.1  ALBUMIN 4.2   Coagulation Profile: Recent Labs  Lab 07/29/24 0800  INR 1.1   BNP (last 3 results) Recent Labs    07/29/24 0800  PROBNP 7,322.0*   HbA1C: Recent Labs    07/29/24 1020  HGBA1C 5.5   CBG: Recent Labs  Lab 07/29/24 1726 07/29/24 2154 07/30/24 0739 07/30/24 1142 07/30/24 1714  GLUCAP 133* 122* 111* 127* 129*   Recent Results (from the past 240 hours)  Resp panel by RT-PCR (RSV, Flu A&B, Covid) Anterior Nasal Swab     Status: None   Collection Time: 07/29/24  7:57 AM   Specimen: Anterior Nasal Swab  Result Value Ref Range Status   SARS  Coronavirus 2 by RT PCR NEGATIVE NEGATIVE Final    Comment: (NOTE) SARS-CoV-2 target nucleic acids are NOT DETECTED.  The SARS-CoV-2 RNA is generally detectable in upper respiratory specimens during the acute phase of infection. The lowest concentration of SARS-CoV-2 viral copies this assay can detect is 138 copies/mL. A negative result does not preclude SARS-Cov-2 infection and should not be used as the sole basis for treatment or other patient management decisions. A negative result may occur with  improper specimen collection/handling, submission of specimen other than nasopharyngeal swab, presence of viral mutation(s) within the areas targeted by this assay, and inadequate number of viral copies(<138 copies/mL). A negative result must be combined with clinical observations, patient history, and epidemiological information. The expected result is Negative.  Fact Sheet for Patients:  bloggercourse.com  Fact Sheet for Healthcare Providers:  seriousbroker.it  This test is no t yet approved or cleared by the [place]  FDA and  has been authorized for detection and/or diagnosis of SARS-CoV-2 by FDA under an Emergency Use Authorization (EUA). This EUA will  remain  in effect (meaning this test can be used) for the duration of the COVID-19 declaration under Section 564(b)(1) of the Act, 21 U.S.C.section 360bbb-3(b)(1), unless the authorization is terminated  or revoked sooner.       Influenza A by PCR NEGATIVE NEGATIVE Final   Influenza B by PCR NEGATIVE NEGATIVE Final    Comment: (NOTE) The Xpert Xpress SARS-CoV-2/FLU/RSV plus assay is intended as an aid in the diagnosis of influenza from Nasopharyngeal swab specimens and should not be used as a sole basis for treatment. Nasal washings and aspirates are unacceptable for Xpert Xpress SARS-CoV-2/FLU/RSV testing.  Fact Sheet for  Patients: bloggercourse.com  Fact Sheet for Healthcare Providers: seriousbroker.it  This test is not yet approved or cleared by the [place]  FDA and has been authorized for detection and/or diagnosis of SARS-CoV-2 by FDA under an Emergency Use Authorization (EUA). This EUA will remain in effect (meaning this test can be used) for the duration of the COVID-19 declaration under Section 564(b)(1) of the Act, 21 U.S.C. section 360bbb-3(b)(1), unless the authorization is terminated or revoked.     Resp Syncytial Virus by PCR NEGATIVE NEGATIVE Final    Comment: (NOTE) Fact Sheet for Patients: bloggercourse.com  Fact Sheet for Healthcare Providers: seriousbroker.it  This test is not yet approved or cleared by the [place]  FDA and has been authorized for detection and/or diagnosis of SARS-CoV-2 by FDA under an Emergency Use Authorization (EUA). This EUA will remain in effect (meaning this test can be used) for the duration of the COVID-19 declaration under Section 564(b)(1) of the Act, 21 U.S.C. section 360bbb-3(b)(1), unless the authorization is terminated or revoked.  Performed at [facility], [address] [place], [place] [address]     Radiology Studies: DG Chest 1 View Result Date: 07/30/2024 EXAM: 1 VIEW(S) XRAY OF THE CHEST 07/30/2024 07:10:55 AM COMPARISON: 07/29/2024 CLINICAL HISTORY: 73 year old male with CHF (congestive heart failure) (HCC). FINDINGS: LUNGS AND PLEURA: Larger lung volumes and bilateral lung base ventilation has improved. Small bilateral pleural effusions. Perihilar interstitial opacities, stable. Left retrocardiac opacity persists. No pneumothorax. HEART AND MEDIASTINUM: Cardiomegaly unchanged. No acute abnormality of the mediastinal silhouette. BONES AND SOFT TISSUES: Right shoulder arthroplasty stable. IMPRESSION: 1. Larger lung volumes  with improved bibasilar ventilation. Regressed but not resolved pulmonary edema and small pleural effusions. Electronically signed by: Helayne Hurst MD 07/30/2024 07:26 AM EST RP Workstation: [id number]   DG Chest Portable 1 View Result Date: 07/29/2024 CLINICAL DATA:  dyspnea EXAM: PORTABLE CHEST - 1 VIEW COMPARISON:  12/09/2016 FINDINGS: Lower lung volumes. Bilateral perihilar interstitial opacities with patchy opacities in the lung bases. No pneumothorax. Blunting of both costophrenic sulci, likely trace effusions. Moderate cardiomegaly.Tortuous aorta with aortic atherosclerosis.No acute fracture or destructive lesion. IMPRESSION: Moderate cardiomegaly. Low lung volumes with findings of either asymmetric pulmonary edema or aspiration. Trace bilateral pleural effusions Electronically Signed   By: Rogelia Myers M.D.   On: 07/29/2024 08:52    Scheduled Meds:  allopurinol   100 mg Oral Daily   [START ON 07/31/2024] amLODipine   5 mg Oral Daily   aspirin  EC  81 mg Oral Daily   carvedilol  12.5 mg Oral BID   ezetimibe  10 mg Oral Daily   fluticasone furoate-vilanterol  1 puff Inhalation Daily   guaiFENesin  1,200 mg Oral BID   heparin   5,000 Units Subcutaneous Q12H   hydrALAZINE   25 mg Oral Q8H   insulin  aspart  0-9 Units Subcutaneous TID WC   ipratropium-albuterol   3 mL Nebulization Q6H   isosorbide mononitrate  30 mg Oral Daily   methylPREDNISolone (SOLU-MEDROL) injection  40 mg Intravenous Q12H   pravastatin   20 mg Oral QHS   tamsulosin  0.4 mg Oral Daily    LOS: 1 day   Time spent: 50 minutes  Dr. Sherre [facility] If 7PM-7AM, please contact night-coverage 07/30/2024, 5:28 PM

## 2024-07-30 NOTE — Progress Notes (Signed)
 Heart Failure Navigator Progress Note  Assessed for Heart & Vascular TOC clinic readiness.  Patient does not meet criteria due to current [facility] Cardiology patient.   Navigator will sign off at this time.  Roxy Horseman, RN, BSN [facility] Heart Failure Navigator Secure Chat Only

## 2024-07-30 NOTE — Assessment & Plan Note (Addendum)
 Query secondary to overdiuresis Discontinue furosemide 60 mg IV twice daily Continue to hold hydrochlorothiazide , olmesartan  40 mg daily A.m. team to resume when benefits outweigh the risk Nephrology has been consulted via epic order to Dr. Marcelino Ultrasound of the renals ordered

## 2024-07-31 ENCOUNTER — Encounter: Payer: Self-pay | Admitting: Internal Medicine

## 2024-07-31 DIAGNOSIS — I5023 Acute on chronic systolic (congestive) heart failure: Secondary | ICD-10-CM | POA: Diagnosis not present

## 2024-07-31 LAB — CBC
HCT: 29.3 % — ABNORMAL LOW (ref 39.0–52.0)
Hemoglobin: 9.2 g/dL — ABNORMAL LOW (ref 13.0–17.0)
MCH: 26.7 pg (ref 26.0–34.0)
MCHC: 31.4 g/dL (ref 30.0–36.0)
MCV: 85.2 fL (ref 80.0–100.0)
Platelets: 311 K/uL (ref 150–400)
RBC: 3.44 MIL/uL — ABNORMAL LOW (ref 4.22–5.81)
RDW: 15.9 % — ABNORMAL HIGH (ref 11.5–15.5)
WBC: 13.8 K/uL — ABNORMAL HIGH (ref 4.0–10.5)
nRBC: 0 % (ref 0.0–0.2)

## 2024-07-31 LAB — BASIC METABOLIC PANEL WITH GFR
Anion gap: 12 (ref 5–15)
BUN: 74 mg/dL — ABNORMAL HIGH (ref 8–23)
CO2: 26 mmol/L (ref 22–32)
Calcium: 8.3 mg/dL — ABNORMAL LOW (ref 8.9–10.3)
Chloride: 103 mmol/L (ref 98–111)
Creatinine, Ser: 4.26 mg/dL — ABNORMAL HIGH (ref 0.61–1.24)
GFR, Estimated: 14 mL/min — ABNORMAL LOW (ref >60)
Glucose, Bld: 141 mg/dL — ABNORMAL HIGH (ref 70–99)
Potassium: 4.4 mmol/L (ref 3.5–5.1)
Sodium: 141 mmol/L (ref 135–145)

## 2024-07-31 LAB — GLUCOSE, CAPILLARY
Glucose-Capillary: 121 mg/dL — ABNORMAL HIGH (ref 70–99)
Glucose-Capillary: 127 mg/dL — ABNORMAL HIGH (ref 70–99)
Glucose-Capillary: 131 mg/dL — ABNORMAL HIGH (ref 70–99)
Glucose-Capillary: 153 mg/dL — ABNORMAL HIGH (ref 70–99)

## 2024-07-31 MED ORDER — SODIUM CHLORIDE 0.9 % IV SOLN: INTRAVENOUS | Status: DC

## 2024-07-31 NOTE — Plan of Care (Signed)
  Problem: Fluid Volume: Goal: Ability to maintain a balanced intake and output will improve Outcome: Progressing   Problem: Metabolic: Goal: Ability to maintain appropriate glucose levels will improve Outcome: Progressing   Problem: Nutritional: Goal: Maintenance of adequate nutrition will improve Outcome: Progressing Goal: Progress toward achieving an optimal weight will improve Outcome: Progressing   Problem: Education: Goal: Knowledge of General Education information will improve Description: Including pain rating scale, medication(s)/side effects and non-pharmacologic comfort measures Outcome: Progressing   Problem: Health Behavior/Discharge Planning: Goal: Ability to manage health-related needs will improve Outcome: Progressing   Problem: Clinical Measurements: Goal: Ability to maintain clinical measurements within normal limits will improve Outcome: Progressing Goal: Will remain free from infection Outcome: Progressing Goal: Diagnostic test results will improve Outcome: Progressing Goal: Respiratory complications will improve Outcome: Progressing Goal: Cardiovascular complication will be avoided Outcome: Progressing

## 2024-07-31 NOTE — Progress Notes (Signed)
 [facility]  ROUNDING NOTE   Subjective:   Eric Browning is a 73 y.o. male with past medical history of diabetes, peripheral vascular disease, hypertension, congestive heart failure, hyperlipidemia, gout, BPH and chronic kidney disease with anemia and proteinuria. Patient presents to ED with shortness of breath and has been admitted for CHF (congestive heart failure) (HCC) [I50.9] SOB (shortness of breath) [R06.02] Hypoxia [R09.02] Acute on chronic systolic congestive heart failure (HCC) [I50.23] Acute respiratory failure with hypoxia and hypercarbia (HCC) [J96.01, J96.02]  Patient is known to our practice and was seen by Dr Dennise in October. He states he was recently seen and diuretic regimen was decreased. Began having shortness of breath a few days ago. Sought assistance when having shortness of breath at rest.   Labs on ED arrival concerning for sodium 147, BUN 36, creatinine 2.90, BNP 7300, troponin 39, and hemoglobin 10.5.  Respiratory panel negative for influenza, COVID-19, and RSV.  Chest x-ray shows moderate cardiomegaly with suspicion for pulmonary edema versus aspiration.  Creatinine has increased during this admission, 4.26 today with BUN 74.  We have been consulted to evaluate acute kidney injury.   Objective:  Vital signs in last 24 hours:  Temp:  [97.8 F (36.6 C)-98.7 F (37.1 C)] 98 F (36.7 C) (12/03 1522) Pulse Rate:  [58-102] 89 (12/03 1522) Resp:  [16-20] 17 (12/03 1522) BP: (103-143)/(58-94) 132/58 (12/03 1522) SpO2:  [90 %-100 %] 97 % (12/03 1522) Weight:  [81.7 kg] 81.7 kg (12/03 0439)  Weight change: -8.566 kg Filed Weights   07/29/24 0754 07/30/24 0500 07/31/24 0439  Weight: 90.3 kg 81.6 kg 81.7 kg    Intake/Output: I/O last 3 completed shifts: In: -  Out: 950 [Urine:950]   Intake/Output this shift:  Total I/O In: 480 [P.O.:480] Out: 300 [Urine:300]  Physical Exam: General: NAD  Head: Normocephalic, atraumatic. Moist oral mucosal  membranes  Eyes: Anicteric  Lungs:  Clear to auscultation, normal effort  Heart: Regular rate and rhythm  Abdomen:  Soft, nontender  Extremities:  No peripheral edema.  Neurologic: Awake, alert, conversant  Skin: Warm,dry, no rash       Basic Metabolic Panel: Recent Labs  Lab 07/29/24 0800 07/30/24 0437 07/31/24 0406  NA 147* 145 141  K 4.1 4.1 4.4  CL 109 106 103  CO2 28 28 26   GLUCOSE 130* 133* 141*  BUN 36* 51* 74*  CREATININE 2.90* 3.54* 4.26*  CALCIUM  9.4 8.7* 8.3*    Liver Function Tests: Recent Labs  Lab 07/29/24 0800  AST 11*  ALT 7  ALKPHOS 98  BILITOT 0.3  PROT 7.1  ALBUMIN 4.2   No results for input(s): LIPASE, AMYLASE in the last 168 hours. No results for input(s): AMMONIA in the last 168 hours.  CBC: Recent Labs  Lab 07/29/24 0800 07/31/24 0406  WBC 7.1 13.8*  HGB 10.5* 9.2*  HCT 33.7* 29.3*  MCV 85.3 85.2  PLT 343 311    Cardiac Enzymes: No results for input(s): CKTOTAL, CKMB, CKMBINDEX, TROPONINI in the last 168 hours.  BNP: Invalid input(s): POCBNP  CBG: Recent Labs  Lab 07/30/24 0739 07/30/24 1142 07/30/24 1714 07/31/24 0724 07/31/24 1117  GLUCAP 111* 127* 129* 121* 127*    Microbiology: Results for orders placed or performed during the hospital encounter of 07/29/24  Resp panel by RT-PCR (RSV, Flu A&B, Covid) Anterior Nasal Swab     Status: None   Collection Time: 07/29/24  7:57 AM   Specimen: Anterior Nasal Swab  Result Value  Ref Range Status   SARS Coronavirus 2 by RT PCR NEGATIVE NEGATIVE Final    Comment: (NOTE) SARS-CoV-2 target nucleic acids are NOT DETECTED.  The SARS-CoV-2 RNA is generally detectable in upper respiratory specimens during the acute phase of infection. The lowest concentration of SARS-CoV-2 viral copies this assay can detect is 138 copies/mL. A negative result does not preclude SARS-Cov-2 infection and should not be used as the sole basis for treatment or other patient  management decisions. A negative result may occur with  improper specimen collection/handling, submission of specimen other than nasopharyngeal swab, presence of viral mutation(s) within the areas targeted by this assay, and inadequate number of viral copies(<138 copies/mL). A negative result must be combined with clinical observations, patient history, and epidemiological information. The expected result is Negative.  Fact Sheet for Patients:  bloggercourse.com  Fact Sheet for Healthcare Providers:  seriousbroker.it  This test is no t yet approved or cleared by the [place]  FDA and  has been authorized for detection and/or diagnosis of SARS-CoV-2 by FDA under an Emergency Use Authorization (EUA). This EUA will remain  in effect (meaning this test can be used) for the duration of the COVID-19 declaration under Section 564(b)(1) of the Act, 21 U.S.C.section 360bbb-3(b)(1), unless the authorization is terminated  or revoked sooner.       Influenza A by PCR NEGATIVE NEGATIVE Final   Influenza B by PCR NEGATIVE NEGATIVE Final    Comment: (NOTE) The Xpert Xpress SARS-CoV-2/FLU/RSV plus assay is intended as an aid in the diagnosis of influenza from Nasopharyngeal swab specimens and should not be used as a sole basis for treatment. Nasal washings and aspirates are unacceptable for Xpert Xpress SARS-CoV-2/FLU/RSV testing.  Fact Sheet for Patients: bloggercourse.com  Fact Sheet for Healthcare Providers: seriousbroker.it  This test is not yet approved or cleared by the [place]  FDA and has been authorized for detection and/or diagnosis of SARS-CoV-2 by FDA under an Emergency Use Authorization (EUA). This EUA will remain in effect (meaning this test can be used) for the duration of the COVID-19 declaration under Section 564(b)(1) of the Act, 21 U.S.C. section 360bbb-3(b)(1),  unless the authorization is terminated or revoked.     Resp Syncytial Virus by PCR NEGATIVE NEGATIVE Final    Comment: (NOTE) Fact Sheet for Patients: bloggercourse.com  Fact Sheet for Healthcare Providers: seriousbroker.it  This test is not yet approved or cleared by the [place]  FDA and has been authorized for detection and/or diagnosis of SARS-CoV-2 by FDA under an Emergency Use Authorization (EUA). This EUA will remain in effect (meaning this test can be used) for the duration of the COVID-19 declaration under Section 564(b)(1) of the Act, 21 U.S.C. section 360bbb-3(b)(1), unless the authorization is terminated or revoked.  Performed at [facility], [address] [place], [place] [address]     Coagulation Studies: Recent Labs    07/29/24 0800  LABPROT 15.0  INR 1.1    Urinalysis: No results for input(s): COLORURINE, LABSPEC, PHURINE, GLUCOSEU, HGBUR, BILIRUBINUR, KETONESUR, PROTEINUR, UROBILINOGEN, NITRITE, LEUKOCYTESUR in the last 72 hours.  Invalid input(s): APPERANCEUR    Imaging: US  RENAL Result Date: 07/30/2024 EXAM: US  Retroperitoneum Complete, Renal. 07/30/2024 06:38:43 PM TECHNIQUE: Real-time ultrasonography of the retroperitoneum renal was performed. COMPARISON: None available CLINICAL HISTORY: [id number] AKI (acute kidney injury) 409830 AKI (acute kidney injury) FINDINGS: FINDINGS: RIGHT KIDNEY/URETER: Right kidney measures 10.7 cm in length. Cystic mass is in the right kidney without internal vascularity measuring up to 2.8 cm. Normal cortical  echogenicity. No hydronephrosis. No calculus. No mass. LEFT KIDNEY/URETER: Left kidney measures 12.0 cm in length. Echogenic mass in the left kidney upper pole measuring 3.2 x 2.5 x 3.0 cm without internal vascularity. This may be related to post ablation changes. Additional simple cysts in the left kidney. Normal cortical echogenicity.  No hydronephrosis. No calculus. No mass. BLADDER: Thickened bladder wall. IMPRESSION: 1. No acute renal abnormality. 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 These were better evaluated with MRI abdomen 8 / 29 / 25. 2. Thickened bladder wall. Correlate for cystitis. Electronically signed by: Norman Gatlin MD 07/30/2024 08:53 PM EST RP Workstation: [id number]   DG Chest 1 View Result Date: 07/30/2024 EXAM: 1 VIEW(S) XRAY OF THE CHEST 07/30/2024 07:10:55 AM COMPARISON: 07/29/2024 CLINICAL HISTORY: 73 year old male with CHF (congestive heart failure) (HCC). FINDINGS: LUNGS AND PLEURA: Larger lung volumes and bilateral lung base ventilation has improved. Small bilateral pleural effusions. Perihilar interstitial opacities, stable. Left retrocardiac opacity persists. No pneumothorax. HEART AND MEDIASTINUM: Cardiomegaly unchanged. No acute abnormality of the mediastinal silhouette. BONES AND SOFT TISSUES: Right shoulder arthroplasty stable. IMPRESSION: 1. Larger lung volumes with improved bibasilar ventilation. Regressed but not resolved pulmonary edema and small pleural effusions. Electronically signed by: Helayne Hurst MD 07/30/2024 07:26 AM EST RP Workstation: [id number]     Medications:    sodium chloride  40 mL/hr at 07/31/24 1156    amLODipine   5 mg Oral Daily   aspirin  EC  81 mg Oral Daily   carvedilol  12.5 mg Oral BID   ezetimibe  10 mg Oral Daily   fluticasone furoate-vilanterol  1 puff Inhalation Daily   guaiFENesin  1,200 mg Oral BID   heparin   5,000 Units Subcutaneous Q12H   hydrALAZINE   25 mg Oral Q8H   insulin  aspart  0-9 Units Subcutaneous TID WC   ipratropium-albuterol   3 mL Nebulization BID   isosorbide mononitrate  30 mg Oral Daily   methylPREDNISolone (SOLU-MEDROL) injection  40 mg Intravenous Q12H   pravastatin   20 mg Oral QHS   tamsulosin  0.4 mg Oral Daily   acetaminophen , albuterol , hydrALAZINE , ondansetron  (ZOFRAN ) IV  Assessment/ Plan:  Mr.  Kalman Nylen is a 73 y.o.  adult with past medical history of diabetes, peripheral vascular disease, hypertension, congestive heart failure, hyperlipidemia, gout, BPH and chronic kidney disease with anemia and proteinuria. Patient presents to ED with shortness of breath and has been admitted for CHF (congestive heart failure) (HCC) [I50.9] SOB (shortness of breath) [R06.02] Hypoxia [R09.02] Acute on chronic systolic congestive heart failure (HCC) [I50.23] Acute respiratory failure with hypoxia and hypercarbia (HCC) [J96.01, J96.02]   Acute Kidney Injury on chronic kidney disease stage 4 with baseline creatinine 3.0 and GFR of 21 on 07/03/2024.  Acute kidney injury secondary to volume overload with IV diuresis. Creatinine baseline on Ed arrival, now 4.2. Was given IV furosemide for diuresis. Now held. Clinically patient appears improved. Will order low dose IV hydration, normal saline 0.9% at 40ml/hr for 1 day. Will monitor am labs.    Lab Results  Component Value Date   CREATININE 4.26 (H) 07/31/2024   CREATININE 3.54 (H) 07/30/2024   CREATININE 2.90 (H) 07/29/2024    Intake/Output Summary (Last 24 hours) at 07/31/2024 1619 Last data filed at 07/31/2024 1416 Gross per 24 hour  Intake 480 ml  Output 300 ml  Net 180 ml   2. Chronic systolic heart failure , ECHO completed on 06/07/24 shows EF 40%. Prescribed Torsemide  40mg  twice daily,  then decreased to daily, per patient. Will evaluate this regimen prior to discharge.   3. Anemia of chronic kidney disease  Lab Results  Component Value Date   HGB 9.2 (L) 07/31/2024    Hgb at goal. Will continue to monitor.   4. Hypertension with chronic kidney disease. Receiving amlodipine , carvedilol, hydralazine , and isosorbide.    LOS: 2 Morenike Cuff 12/3/20254:19 PM

## 2024-07-31 NOTE — Progress Notes (Addendum)
 PROGRESS NOTE  Eric Browning FMW:[id number] DOB: 21-May-1951 DOA: 07/29/2024 PCP: Alla Amis, MD   LOS: 2 days   Brief narrative:  [name] is a 73 year old male with past medical history of hypertension, gout, heart failure reduced ejection fraction, non-insulin -dependent diabetes mellitus type 2, severe COPD, pulmonary hypertension, hyperlipidemia, BPH, presented to hospital with shortness of breath and was noted to be hypoxic on room air at 88% as per EMS.  He was initially on BiPAP as well.  Chest x-ray showed pulmonary vascular congestion.  Labs were notable for creatinine of 2.9 with sodium of 147.  Initially patient was given IV Lasix  40 mg IV Solu-Medrol , DuoNebs and was admitted to hospital for acute on chronic hypoxic respiratory failure secondary to heart failure decompensation and was started on IV diuresis.  Subsequently, had some worsening of the renal function and Lasix  was discontinued and nephrology was consulted.      Assessment/Plan: Principal Problem:   CHF (congestive heart failure) (HCC) Active Problems:   AKI (acute kidney injury)   COPD with acute exacerbation (HCC)   Essential hypertension   Symptomatic anemia   Primary male hypogonadism   Pure hypercholesterolemia   Type 2 diabetes mellitus without complication, without long-term current use of insulin  (HCC)   Personal history of gout  Acute on chronic congestive heart failure with reduced ejection fraction. Strict intake and output charting.  Lasix  currently on hold.  Continue Coreg .   Currently on room air   COPD with acute exacerbation Continue albuterol  nebulizer, IV Solu-Medrol , incentive spirometry and supportive care.  Mild leukocytosis but patient is on steroids.   Acute kidney injury on CKD stage IIIb. Possibly secondary to overdiuresis.  Was initially on 60 mg of Lasix  IV twice daily.  Nephrology has been consulted.  Renal ultrasound without any acute abnormality.  Creatinine today at 4.2  from 3.5.  Initial creatinine was 2.9.  Continue to monitor intake and output charting.  Patient is negative balance for 1635 mL.  Patient had 400 mL urinary output documented this morning.  Hold Farxiga.   Essential hypertension Patient is on amlodipine  5 mg daily, carvedilol  12.5 mg p.o. twice daily, hydralazine  25 mg every 8 hours, Imdur  30 mg daily.  Continue antihypertensives.  Blood pressure seems to be stable at this time.  Latest blood pressure 124/74.  Type 2 diabetes mellitus without complication, without long-term current use of insulin  (HCC) Continue sliding scale insulin .  Hold metformin  for now.  Glycemic control is adequate at this time   Pure hypercholesterolemia Continue ezetimibe  10 mg daily, pravastatin  20 mg nightly   Personal history of gout Hold allopurinol  100 mg daily due to AKI.  DVT prophylaxis: heparin  injection 5,000 Units Start: 07/29/24 2200   Disposition: Home likely in 1 to 2 days  Status is: Inpatient Remains inpatient appropriate because: Pending clinical improvement, AKI,    Code Status:     Code Status: Full Code  Family Communication: I spoke with the patient's son and updated him about the clinical condition of the patient.   Consultants: Nephrology  Procedures: None  Anti-infectives:  None  Anti-infectives (From admission, onward)    None       Subjective: Today, patient was seen and examined at bedside.  Patient states that he feels short of breath with dizziness usually on walking.  Objective: Vitals:   07/31/24 0725 07/31/24 1117  BP: (!) 143/94 124/74  Pulse: (!) 58 78  Resp: 17 16  Temp: 98 F (36.7 C) 97.9  F (36.6 C)  SpO2: 100% 98%    Intake/Output Summary (Last 24 hours) at 07/31/2024 1249 Last data filed at 07/31/2024 0926 Gross per 24 hour  Intake 240 ml  Output 400 ml  Net -160 ml   Filed Weights   07/29/24 0754 07/30/24 0500 07/31/24 0439  Weight: 90.3 kg 81.6 kg 81.7 kg   Body mass index is 28.21  kg/m.   Physical Exam:  GENERAL: Patient is alert awake and oriented. Not in obvious distress.  Elderly male, Communicative, HENT: No scleral pallor or icterus. Pupils equally reactive to light. Oral mucosa is moist NECK: is supple, no gross swelling noted. CHEST: Diminished breath sounds bilaterally. CVS: S1 and S2 heard, no murmur. Regular rate and rhythm.  ABDOMEN: Soft, non-tender, bowel sounds are present. EXTREMITIES: Bilateral lower extremity edema. CNS: Cranial nerves are intact. No focal motor deficits. SKIN: warm and dry without rashes.  Data Review: I have personally reviewed the following laboratory data and studies,  CBC: Recent Labs  Lab 07/29/24 0800 07/31/24 0406  WBC 7.1 13.8*  HGB 10.5* 9.2*  HCT 33.7* 29.3*  MCV 85.3 85.2  PLT 343 311   Basic Metabolic Panel: Recent Labs  Lab 07/29/24 0800 07/30/24 0437 07/31/24 0406  NA 147* 145 141  K 4.1 4.1 4.4  CL 109 106 103  CO2 28 28 26   GLUCOSE 130* 133* 141*  BUN 36* 51* 74*  CREATININE 2.90* 3.54* 4.26*  CALCIUM  9.4 8.7* 8.3*   Liver Function Tests: Recent Labs  Lab 07/29/24 0800  AST 11*  ALT 7  ALKPHOS 98  BILITOT 0.3  PROT 7.1  ALBUMIN 4.2   No results for input(s): LIPASE, AMYLASE in the last 168 hours. No results for input(s): AMMONIA in the last 168 hours. Cardiac Enzymes: No results for input(s): CKTOTAL, CKMB, CKMBINDEX, TROPONINI in the last 168 hours. BNP (last 3 results) No results for input(s): BNP in the last 8760 hours.  ProBNP (last 3 results) Recent Labs    07/29/24 0800  PROBNP 7,322.0*    CBG: Recent Labs  Lab 07/30/24 0739 07/30/24 1142 07/30/24 1714 07/31/24 0724 07/31/24 1117  GLUCAP 111* 127* 129* 121* 127*   Recent Results (from the past 240 hours)  Resp panel by RT-PCR (RSV, Flu A&B, Covid) Anterior Nasal Swab     Status: None   Collection Time: 07/29/24  7:57 AM   Specimen: Anterior Nasal Swab  Result Value Ref Range Status   SARS  Coronavirus 2 by RT PCR NEGATIVE NEGATIVE Final    Comment: (NOTE) SARS-CoV-2 target nucleic acids are NOT DETECTED.  The SARS-CoV-2 RNA is generally detectable in upper respiratory specimens during the acute phase of infection. The lowest concentration of SARS-CoV-2 viral copies this assay can detect is 138 copies/mL. A negative result does not preclude SARS-Cov-2 infection and should not be used as the sole basis for treatment or other patient management decisions. A negative result may occur with  improper specimen collection/handling, submission of specimen other than nasopharyngeal swab, presence of viral mutation(s) within the areas targeted by this assay, and inadequate number of viral copies(<138 copies/mL). A negative result must be combined with clinical observations, patient history, and epidemiological information. The expected result is Negative.  Fact Sheet for Patients:  bloggercourse.com  Fact Sheet for Healthcare Providers:  seriousbroker.it  This test is no t yet approved or cleared by the [place]  FDA and  has been authorized for detection and/or diagnosis of SARS-CoV-2 by FDA under an Emergency  Use Authorization (EUA). This EUA will remain  in effect (meaning this test can be used) for the duration of the COVID-19 declaration under Section 564(b)(1) of the Act, 21 U.S.C.section 360bbb-3(b)(1), unless the authorization is terminated  or revoked sooner.       Influenza A by PCR NEGATIVE NEGATIVE Final   Influenza B by PCR NEGATIVE NEGATIVE Final    Comment: (NOTE) The Xpert Xpress SARS-CoV-2/FLU/RSV plus assay is intended as an aid in the diagnosis of influenza from Nasopharyngeal swab specimens and should not be used as a sole basis for treatment. Nasal washings and aspirates are unacceptable for Xpert Xpress SARS-CoV-2/FLU/RSV testing.  Fact Sheet for  Patients: bloggercourse.com  Fact Sheet for Healthcare Providers: seriousbroker.it  This test is not yet approved or cleared by the [place]  FDA and has been authorized for detection and/or diagnosis of SARS-CoV-2 by FDA under an Emergency Use Authorization (EUA). This EUA will remain in effect (meaning this test can be used) for the duration of the COVID-19 declaration under Section 564(b)(1) of the Act, 21 U.S.C. section 360bbb-3(b)(1), unless the authorization is terminated or revoked.     Resp Syncytial Virus by PCR NEGATIVE NEGATIVE Final    Comment: (NOTE) Fact Sheet for Patients: bloggercourse.com  Fact Sheet for Healthcare Providers: seriousbroker.it  This test is not yet approved or cleared by the [place]  FDA and has been authorized for detection and/or diagnosis of SARS-CoV-2 by FDA under an Emergency Use Authorization (EUA). This EUA will remain in effect (meaning this test can be used) for the duration of the COVID-19 declaration under Section 564(b)(1) of the Act, 21 U.S.C. section 360bbb-3(b)(1), unless the authorization is terminated or revoked.  Performed at [facility], [address] [place] [place], [place] [address]      Studies: US  RENAL Result Date: 07/30/2024 EXAM: US  Retroperitoneum Complete, Renal. 07/30/2024 06:38:43 PM TECHNIQUE: Real-time ultrasonography of the retroperitoneum renal was performed. COMPARISON: None available CLINICAL HISTORY: [id number] AKI (acute kidney injury) [id number] AKI (acute kidney injury) FINDINGS: FINDINGS: RIGHT KIDNEY/URETER: Right kidney measures 10.7 cm in length. Cystic mass is in the right kidney without internal vascularity measuring up to 2.8 cm. Normal cortical echogenicity. No hydronephrosis. No calculus. No mass. LEFT KIDNEY/URETER: Left kidney measures 12.0 cm in length. Echogenic mass in the left kidney upper  pole measuring 3.2 x 2.5 x 3.0 cm without internal vascularity. This may be related to post ablation changes. Additional simple cysts in the left kidney. Normal cortical echogenicity. No hydronephrosis. No calculus. No mass. BLADDER: Thickened bladder wall. IMPRESSION: 1. No acute renal abnormality. 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 These were better evaluated with MRI abdomen 8 / 29 / 25. 2. Thickened bladder wall. Correlate for cystitis. Electronically signed by: Norman Gatlin MD 07/30/2024 08:53 PM EST RP Workstation: [id number]   DG Chest 1 View Result Date: 07/30/2024 EXAM: 1 VIEW(S) XRAY OF THE CHEST 07/30/2024 07:10:55 AM COMPARISON: 07/29/2024 CLINICAL HISTORY: 73 year old male with CHF (congestive heart failure) (HCC). FINDINGS: LUNGS AND PLEURA: Larger lung volumes and bilateral lung base ventilation has improved. Small bilateral pleural effusions. Perihilar interstitial opacities, stable. Left retrocardiac opacity persists. No pneumothorax. HEART AND MEDIASTINUM: Cardiomegaly unchanged. No acute abnormality of the mediastinal silhouette. BONES AND SOFT TISSUES: Right shoulder arthroplasty stable. IMPRESSION: 1. Larger lung volumes with improved bibasilar ventilation. Regressed but not resolved pulmonary edema and small pleural effusions. Electronically signed by: Helayne Hurst MD 07/30/2024 07:26 AM EST RP Workstation: [id number]  Jimeka Balan, MD  [facility] 07/31/2024  If 7PM-7AM, please contact night-coverage

## 2024-08-01 DIAGNOSIS — I5023 Acute on chronic systolic (congestive) heart failure: Secondary | ICD-10-CM | POA: Diagnosis not present

## 2024-08-01 LAB — BASIC METABOLIC PANEL WITH GFR
Anion gap: 13 (ref 5–15)
BUN: 96 mg/dL — ABNORMAL HIGH (ref 8–23)
CO2: 24 mmol/L (ref 22–32)
Calcium: 7.9 mg/dL — ABNORMAL LOW (ref 8.9–10.3)
Chloride: 99 mmol/L (ref 98–111)
Creatinine, Ser: 4.96 mg/dL — ABNORMAL HIGH (ref 0.61–1.24)
GFR, Estimated: 11 mL/min — ABNORMAL LOW (ref >60)
Glucose, Bld: 164 mg/dL — ABNORMAL HIGH (ref 70–99)
Potassium: 4.6 mmol/L (ref 3.5–5.1)
Sodium: 136 mmol/L (ref 135–145)

## 2024-08-01 LAB — CBC
HCT: 29.9 % — ABNORMAL LOW (ref 39.0–52.0)
Hemoglobin: 9.2 g/dL — ABNORMAL LOW (ref 13.0–17.0)
MCH: 26.1 pg (ref 26.0–34.0)
MCHC: 30.8 g/dL (ref 30.0–36.0)
MCV: 84.9 fL (ref 80.0–100.0)
Platelets: 280 K/uL (ref 150–400)
RBC: 3.52 MIL/uL — ABNORMAL LOW (ref 4.22–5.81)
RDW: 15.9 % — ABNORMAL HIGH (ref 11.5–15.5)
WBC: 12 K/uL — ABNORMAL HIGH (ref 4.0–10.5)
nRBC: 0.2 % (ref 0.0–0.2)

## 2024-08-01 LAB — MAGNESIUM: Magnesium: 2.8 mg/dL — ABNORMAL HIGH (ref 1.7–2.4)

## 2024-08-01 LAB — GLUCOSE, CAPILLARY
Glucose-Capillary: 113 mg/dL — ABNORMAL HIGH (ref 70–99)
Glucose-Capillary: 118 mg/dL — ABNORMAL HIGH (ref 70–99)
Glucose-Capillary: 180 mg/dL — ABNORMAL HIGH (ref 70–99)
Glucose-Capillary: 147 mg/dL — ABNORMAL HIGH (ref 70–99)

## 2024-08-01 MED ORDER — PREDNISONE 20 MG PO TABS
40.0000 mg | ORAL_TABLET | Freq: Every day | ORAL | Status: DC
  Administered 2024-08-02 – 2024-08-05 (×4): 40 mg via ORAL
  Filled 2024-08-01 (×4): qty 2

## 2024-08-01 NOTE — Progress Notes (Signed)
 PROGRESS NOTE  Eric Browning FMW:[id number] DOB: July 28, 1951 DOA: 07/29/2024 PCP: Alla Amis, MD   LOS: 3 days   Brief narrative:  Eric Browning is a 73 year old male with past medical history of hypertension, gout, heart failure reduced ejection fraction, non-insulin -dependent diabetes mellitus type 2, severe COPD, pulmonary hypertension, hyperlipidemia, BPH, presented to hospital with shortness of breath and was noted to be hypoxic on room air at 88% as per EMS.  He was initially on BiPAP as well.  Chest x-ray showed pulmonary vascular congestion.  Labs were notable for creatinine of 2.9 with sodium of 147.  Initially patient was given IV Lasix 40 mg IV Solu-Medrol, DuoNebs and was admitted to hospital for acute on chronic hypoxic respiratory failure secondary to heart failure decompensation and was started on IV diuresis.  Subsequently, had some worsening of the renal function, Lasix was discontinued and nephrology was consulted.      Assessment/Plan: Principal Problem:   CHF (congestive heart failure) (HCC) Active Problems:   AKI (acute kidney injury)   COPD with acute exacerbation (HCC)   Essential hypertension   Symptomatic anemia   Primary male hypogonadism   Pure hypercholesterolemia   Type 2 diabetes mellitus without complication, without long-term current use of insulin  (HCC)   Personal history of gout  Acute on chronic congestive heart failure with reduced ejection fraction. Improved symptoms.  Continue strict intake and output charting.  Lasix currently on hold due to AKI.  Continue Coreg.   Currently on room air   COPD with acute exacerbation Improved.  Continue albuterol  nebulizer, will change IV Solu-Medrol to prednisone .  Continue incentive spirometry and supportive care.  Mild leukocytosis but patient is on steroids.   Acute kidney injury on CKD stage IIIb. Possibly secondary to overdiuresis.  Was initially on 60 mg of Lasix IV twice daily.  Nephrology on board.   Renal ultrasound without any acute abnormality.  Creatinine today at 4.9 from 4.2 <3.5.  Initial creatinine was 2.9.  Patient was started on gentle IV fluids since yesterday.  Continue to monitor intake and output charting.  Patient is negative balance for 2-3 0 mL and had a total of 1075 mL urinary output in the last 24 hours.  Continue to hold Farxiga.   Essential hypertension Patient is on amlodipine  5 mg daily, carvedilol 12.5 mg p.o. twice daily, hydralazine  25 mg every 8 hours, Imdur 30 mg daily.  Blood pressure seems to be stable at this time.  Latest blood pressure 138/72  Type 2 diabetes mellitus without complication, without long-term current use of insulin  (HCC) Continue sliding scale insulin .  Hold metformin  for now.  Glycemic control is adequate at this time   Pure hypercholesterolemia Continue ezetimibe 10 mg daily, pravastatin  20 mg nightly   Personal history of gout Hold allopurinol  100 mg daily due to AKI.  DVT prophylaxis: heparin  injection 5,000 Units Start: 07/29/24 2200   Disposition: Home likely in 1 to 2 days  Status is: Inpatient Remains inpatient appropriate because: Pending clinical improvement, AKI,    Code Status:     Code Status: Full Code  Family Communication: I spoke with the patient's son and updated him about the clinical condition of the patient on 07/31/2024.   Consultants: Nephrology  Procedures: None  Anti-infectives:  None  Anti-infectives (From admission, onward)    None       Subjective: Today, patient was seen and examined at bedside.  Patient states that he feels okay denies any shortness of breath chest pain  cough or congestion.  No urinary or bowel issues.  Objective: Vitals:   08/01/24 0450 08/01/24 0833  BP: 114/61 138/72  Pulse: 87 76  Resp: 20 20  Temp: (!) 97.5 F (36.4 C) 98.1 F (36.7 C)  SpO2: 100% 94%    Intake/Output Summary (Last 24 hours) at 08/01/2024 1044 Last data filed at 08/01/2024 0900 Gross per  24 hour  Intake 480 ml  Output 1075 ml  Net -595 ml   Filed Weights   07/30/24 0500 07/31/24 0439 08/01/24 0500  Weight: 81.6 kg 81.7 kg 81.6 kg   Body mass index is 28.18 kg/m.   Physical Exam:  GENERAL: Patient is alert awake and oriented. Not in obvious distress.  Elderly male, on room air, HENT: No scleral pallor or icterus. Pupils equally reactive to light. Oral mucosa is moist NECK: is supple, no gross swelling noted. CHEST: Diminished breath sounds bilaterally.  No obvious crackles noted. CVS: S1 and S2 heard, no murmur. Regular rate and rhythm.  ABDOMEN: Soft, non-tender, bowel sounds are present. EXTREMITIES: Bilateral lower extremity trace edema. CNS: Cranial nerves are intact. No focal motor deficits. SKIN: warm and dry without rashes.  Data Review: I have personally reviewed the following laboratory data and studies,  CBC: Recent Labs  Lab 07/29/24 0800 07/31/24 0406 08/01/24 0410  WBC 7.1 13.8* 12.0*  HGB 10.5* 9.2* 9.2*  HCT 33.7* 29.3* 29.9*  MCV 85.3 85.2 84.9  PLT 343 311 280   Basic Metabolic Panel: Recent Labs  Lab 07/29/24 0800 07/30/24 0437 07/31/24 0406 08/01/24 0410  NA 147* 145 141 136  K 4.1 4.1 4.4 4.6  CL 109 106 103 99  CO2 28 28 26 24   GLUCOSE 130* 133* 141* 164*  BUN 36* 51* 74* 96*  CREATININE 2.90* 3.54* 4.26* 4.96*  CALCIUM  9.4 8.7* 8.3* 7.9*  MG  --   --   --  2.8*   Liver Function Tests: Recent Labs  Lab 07/29/24 0800  AST 11*  ALT 7  ALKPHOS 98  BILITOT 0.3  PROT 7.1  ALBUMIN 4.2   No results for input(s): LIPASE, AMYLASE in the last 168 hours. No results for input(s): AMMONIA in the last 168 hours. Cardiac Enzymes: No results for input(s): CKTOTAL, CKMB, CKMBINDEX, TROPONINI in the last 168 hours. BNP (last 3 results) No results for input(s): BNP in the last 8760 hours.  ProBNP (last 3 results) Recent Labs    07/29/24 0800  PROBNP 7,322.0*    CBG: Recent Labs  Lab 07/31/24 0724  07/31/24 1117 07/31/24 1620 07/31/24 2043 08/01/24 0829  GLUCAP 121* 127* 131* 153* 113*   Recent Results (from the past 240 hours)  Resp panel by RT-PCR (RSV, Flu A&B, Covid) Anterior Nasal Swab     Status: None   Collection Time: 07/29/24  7:57 AM   Specimen: Anterior Nasal Swab  Result Value Ref Range Status   SARS Coronavirus 2 by RT PCR NEGATIVE NEGATIVE Final    Comment: (NOTE) SARS-CoV-2 target nucleic acids are NOT DETECTED.  The SARS-CoV-2 RNA is generally detectable in upper respiratory specimens during the acute phase of infection. The lowest concentration of SARS-CoV-2 viral copies this assay can detect is 138 copies/mL. A negative result does not preclude SARS-Cov-2 infection and should not be used as the sole basis for treatment or other patient management decisions. A negative result may occur with  improper specimen collection/handling, submission of specimen other than nasopharyngeal swab, presence of viral mutation(s) within the areas targeted  by this assay, and inadequate number of viral copies(<138 copies/mL). A negative result must be combined with clinical observations, patient history, and epidemiological information. The expected result is Negative.  Fact Sheet for Patients:  bloggercourse.com  Fact Sheet for Healthcare Providers:  seriousbroker.it  This test is no t yet approved or cleared by the [place]  FDA and  has been authorized for detection and/or diagnosis of SARS-CoV-2 by FDA under an Emergency Use Authorization (EUA). This EUA will remain  in effect (meaning this test can be used) for the duration of the COVID-19 declaration under Section 564(b)(1) of the Act, 21 U.S.C.section 360bbb-3(b)(1), unless the authorization is terminated  or revoked sooner.       Influenza A by PCR NEGATIVE NEGATIVE Final   Influenza B by PCR NEGATIVE NEGATIVE Final    Comment: (NOTE) The Xpert Xpress  SARS-CoV-2/FLU/RSV plus assay is intended as an aid in the diagnosis of influenza from Nasopharyngeal swab specimens and should not be used as a sole basis for treatment. Nasal washings and aspirates are unacceptable for Xpert Xpress SARS-CoV-2/FLU/RSV testing.  Fact Sheet for Patients: bloggercourse.com  Fact Sheet for Healthcare Providers: seriousbroker.it  This test is not yet approved or cleared by the [place]  FDA and has been authorized for detection and/or diagnosis of SARS-CoV-2 by FDA under an Emergency Use Authorization (EUA). This EUA will remain in effect (meaning this test can be used) for the duration of the COVID-19 declaration under Section 564(b)(1) of the Act, 21 U.S.C. section 360bbb-3(b)(1), unless the authorization is terminated or revoked.     Resp Syncytial Virus by PCR NEGATIVE NEGATIVE Final    Comment: (NOTE) Fact Sheet for Patients: bloggercourse.com  Fact Sheet for Healthcare Providers: seriousbroker.it  This test is not yet approved or cleared by the [place]  FDA and has been authorized for detection and/or diagnosis of SARS-CoV-2 by FDA under an Emergency Use Authorization (EUA). This EUA will remain in effect (meaning this test can be used) for the duration of the COVID-19 declaration under Section 564(b)(1) of the Act, 21 U.S.C. section 360bbb-3(b)(1), unless the authorization is terminated or revoked.  Performed at [facility], [address] [place], [place] [address]      Studies: US  RENAL Result Date: 07/30/2024 EXAM: US  Retroperitoneum Complete, Renal. 07/30/2024 06:38:43 PM TECHNIQUE: Real-time ultrasonography of the retroperitoneum renal was performed. COMPARISON: None available CLINICAL HISTORY: [id number] AKI (acute kidney injury) [id number] AKI (acute kidney injury) FINDINGS: FINDINGS: RIGHT KIDNEY/URETER: Right kidney  measures 10.7 cm in length. Cystic mass is in the right kidney without internal vascularity measuring up to 2.8 cm. Normal cortical echogenicity. No hydronephrosis. No calculus. No mass. LEFT KIDNEY/URETER: Left kidney measures 12.0 cm in length. Echogenic mass in the left kidney upper pole measuring 3.2 x 2.5 x 3.0 cm without internal vascularity. This may be related to post ablation changes. Additional simple cysts in the left kidney. Normal cortical echogenicity. No hydronephrosis. No calculus. No mass. BLADDER: Thickened bladder wall. IMPRESSION: 1. No acute renal abnormality. 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 These were better evaluated with MRI abdomen 8 / 29 / 25. 2. Thickened bladder wall. Correlate for cystitis. Electronically signed by: Norman Gatlin MD 07/30/2024 08:53 PM EST RP Workstation: [id number]      Vernal Alstrom, MD  [facility] 08/01/2024  If 7PM-7AM, please contact night-coverage

## 2024-08-01 NOTE — Care Management Important Message (Signed)
 Important Message  Patient Details  Name: Eric Browning MRN: [id number] Date of Birth: 06-11-1951   Important Message Given:  Yes - Medicare IM     Zaim Nitta W, CMA 08/01/2024, 11:03 AM

## 2024-08-01 NOTE — Plan of Care (Signed)

## 2024-08-01 NOTE — Progress Notes (Signed)
 [facility]  ROUNDING NOTE   Subjective:   Eric Browning is a 73 y.o. male with past medical history of diabetes, peripheral vascular disease, hypertension, congestive heart failure, hyperlipidemia, gout, BPH and chronic kidney disease with anemia and proteinuria. Patient presents to ED with shortness of breath and has been admitted for CHF (congestive heart failure) (HCC) [I50.9] SOB (shortness of breath) [R06.02] Hypoxia [R09.02] Acute on chronic systolic congestive heart failure (HCC) [I50.23] Acute respiratory failure with hypoxia and hypercarbia (HCC) [J96.01, J96.02]  Patient is known to our practice and was seen by Dr Dennise in October.   Update Patient seen sitting at side of bed Alert and oriented Denies shortness of breath room air Trace lower extremity edema  Creatinine 4.96   Objective:  Vital signs in last 24 hours:  Temp:  [97.4 F (36.3 C)-98.1 F (36.7 C)] 97.4 F (36.3 C) (12/04 1154) Pulse Rate:  [65-89] 65 (12/04 1154) Resp:  [17-20] 18 (12/04 1154) BP: (100-138)/(54-84) 112/68 (12/04 1154) SpO2:  [94 %-100 %] 100 % (12/04 1154) Weight:  [81.6 kg] 81.6 kg (12/04 0500)  Weight change: -0.1 kg Filed Weights   07/30/24 0500 07/31/24 0439 08/01/24 0500  Weight: 81.6 kg 81.7 kg 81.6 kg    Intake/Output: I/O last 3 completed shifts: In: 720 [P.O.:720] Out: 1075 [Urine:1075]   Intake/Output this shift:  Total I/O In: -  Out: 300 [Urine:300]  Physical Exam: General: NAD  Head: Normocephalic, atraumatic. Moist oral mucosal membranes  Eyes: Anicteric  Lungs:  Clear to auscultation, normal effort  Heart: Regular rate and rhythm  Abdomen:  Soft, nontender  Extremities:  No peripheral edema.  Neurologic: Awake, alert, conversant  Skin: Warm,dry, no rash       Basic Metabolic Panel: Recent Labs  Lab 07/29/24 0800 07/30/24 0437 07/31/24 0406 08/01/24 0410  NA 147* 145 141 136  K 4.1 4.1 4.4 4.6  CL 109 106 103 99  CO2 28 28 26 24    GLUCOSE 130* 133* 141* 164*  BUN 36* 51* 74* 96*  CREATININE 2.90* 3.54* 4.26* 4.96*  CALCIUM  9.4 8.7* 8.3* 7.9*  MG  --   --   --  2.8*    Liver Function Tests: Recent Labs  Lab 07/29/24 0800  AST 11*  ALT 7  ALKPHOS 98  BILITOT 0.3  PROT 7.1  ALBUMIN 4.2   No results for input(s): LIPASE, AMYLASE in the last 168 hours. No results for input(s): AMMONIA in the last 168 hours.  CBC: Recent Labs  Lab 07/29/24 0800 07/31/24 0406 08/01/24 0410  WBC 7.1 13.8* 12.0*  HGB 10.5* 9.2* 9.2*  HCT 33.7* 29.3* 29.9*  MCV 85.3 85.2 84.9  PLT 343 311 280    Cardiac Enzymes: No results for input(s): CKTOTAL, CKMB, CKMBINDEX, TROPONINI in the last 168 hours.  BNP: Invalid input(s): POCBNP  CBG: Recent Labs  Lab 07/31/24 1117 07/31/24 1620 07/31/24 2043 08/01/24 0829 08/01/24 1201  GLUCAP 127* 131* 153* 113* 118*    Microbiology: Results for orders placed or performed during the hospital encounter of 07/29/24  Resp panel by RT-PCR (RSV, Flu A&B, Covid) Anterior Nasal Swab     Status: None   Collection Time: 07/29/24  7:57 AM   Specimen: Anterior Nasal Swab  Result Value Ref Range Status   SARS Coronavirus 2 by RT PCR NEGATIVE NEGATIVE Final    Comment: (NOTE) SARS-CoV-2 target nucleic acids are NOT DETECTED.  The SARS-CoV-2 RNA is generally detectable in upper respiratory specimens during the  acute phase of infection. The lowest concentration of SARS-CoV-2 viral copies this assay can detect is 138 copies/mL. A negative result does not preclude SARS-Cov-2 infection and should not be used as the sole basis for treatment or other patient management decisions. A negative result may occur with  improper specimen collection/handling, submission of specimen other than nasopharyngeal swab, presence of viral mutation(s) within the areas targeted by this assay, and inadequate number of viral copies(<138 copies/mL). A negative result must be combined  with clinical observations, patient history, and epidemiological information. The expected result is Negative.  Fact Sheet for Patients:  bloggercourse.com  Fact Sheet for Healthcare Providers:  seriousbroker.it  This test is no t yet approved or cleared by the [place]  FDA and  has been authorized for detection and/or diagnosis of SARS-CoV-2 by FDA under an Emergency Use Authorization (EUA). This EUA will remain  in effect (meaning this test can be used) for the duration of the COVID-19 declaration under Section 564(b)(1) of the Act, 21 U.S.C.section 360bbb-3(b)(1), unless the authorization is terminated  or revoked sooner.       Influenza A by PCR NEGATIVE NEGATIVE Final   Influenza B by PCR NEGATIVE NEGATIVE Final    Comment: (NOTE) The Xpert Xpress SARS-CoV-2/FLU/RSV plus assay is intended as an aid in the diagnosis of influenza from Nasopharyngeal swab specimens and should not be used as a sole basis for treatment. Nasal washings and aspirates are unacceptable for Xpert Xpress SARS-CoV-2/FLU/RSV testing.  Fact Sheet for Patients: bloggercourse.com  Fact Sheet for Healthcare Providers: seriousbroker.it  This test is not yet approved or cleared by the [place]  FDA and has been authorized for detection and/or diagnosis of SARS-CoV-2 by FDA under an Emergency Use Authorization (EUA). This EUA will remain in effect (meaning this test can be used) for the duration of the COVID-19 declaration under Section 564(b)(1) of the Act, 21 U.S.C. section 360bbb-3(b)(1), unless the authorization is terminated or revoked.     Resp Syncytial Virus by PCR NEGATIVE NEGATIVE Final    Comment: (NOTE) Fact Sheet for Patients: bloggercourse.com  Fact Sheet for Healthcare Providers: seriousbroker.it  This test is not yet approved  or cleared by the [place]  FDA and has been authorized for detection and/or diagnosis of SARS-CoV-2 by FDA under an Emergency Use Authorization (EUA). This EUA will remain in effect (meaning this test can be used) for the duration of the COVID-19 declaration under Section 564(b)(1) of the Act, 21 U.S.C. section 360bbb-3(b)(1), unless the authorization is terminated or revoked.  Performed at [facility], [address] [place], [place] [address]     Coagulation Studies: No results for input(s): LABPROT, INR in the last 72 hours.   Urinalysis: No results for input(s): COLORURINE, LABSPEC, PHURINE, GLUCOSEU, HGBUR, BILIRUBINUR, KETONESUR, PROTEINUR, UROBILINOGEN, NITRITE, LEUKOCYTESUR in the last 72 hours.  Invalid input(s): APPERANCEUR    Imaging: US  RENAL Result Date: 07/30/2024 EXAM: US  Retroperitoneum Complete, Renal. 07/30/2024 06:38:43 PM TECHNIQUE: Real-time ultrasonography of the retroperitoneum renal was performed. COMPARISON: None available CLINICAL HISTORY: [id number] AKI (acute kidney injury) 409830 AKI (acute kidney injury) FINDINGS: FINDINGS: RIGHT KIDNEY/URETER: Right kidney measures 10.7 cm in length. Cystic mass is in the right kidney without internal vascularity measuring up to 2.8 cm. Normal cortical echogenicity. No hydronephrosis. No calculus. No mass. LEFT KIDNEY/URETER: Left kidney measures 12.0 cm in length. Echogenic mass in the left kidney upper pole measuring 3.2 x 2.5 x 3.0 cm without internal vascularity. This may be related to post ablation changes.  Additional simple cysts in the left kidney. Normal cortical echogenicity. No hydronephrosis. No calculus. No mass. BLADDER: Thickened bladder wall. IMPRESSION: 1. No acute renal abnormality. 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 These were better evaluated with MRI abdomen 8 / 29 / 25. 2. Thickened bladder wall. Correlate for cystitis.  Electronically signed by: Norman Gatlin MD 07/30/2024 08:53 PM EST RP Workstation: [id number]     Medications:    sodium chloride  40 mL/hr at 07/31/24 1156    amLODipine   5 mg Oral Daily   aspirin  EC  81 mg Oral Daily   carvedilol  12.5 mg Oral BID   ezetimibe  10 mg Oral Daily   fluticasone furoate-vilanterol  1 puff Inhalation Daily   guaiFENesin  1,200 mg Oral BID   heparin   5,000 Units Subcutaneous Q12H   hydrALAZINE   25 mg Oral Q8H   insulin  aspart  0-9 Units Subcutaneous TID WC   ipratropium-albuterol   3 mL Nebulization BID   isosorbide mononitrate  30 mg Oral Daily   pravastatin   20 mg Oral QHS   [START ON 08/02/2024] predniSONE   40 mg Oral Q breakfast   tamsulosin  0.4 mg Oral Daily   acetaminophen , albuterol , hydrALAZINE , ondansetron  (ZOFRAN ) IV  Assessment/ Plan:  Mr. Jeremaine Maraj is a 73 y.o.  adult with past medical history of diabetes, peripheral vascular disease, hypertension, congestive heart failure, hyperlipidemia, gout, BPH and chronic kidney disease with anemia and proteinuria. Patient presents to ED with shortness of breath and has been admitted for CHF (congestive heart failure) (HCC) [I50.9] SOB (shortness of breath) [R06.02] Hypoxia [R09.02] Acute on chronic systolic congestive heart failure (HCC) [I50.23] Acute respiratory failure with hypoxia and hypercarbia (HCC) [J96.01, J96.02]   Acute Kidney Injury on chronic kidney disease stage 4 with baseline creatinine 3.0 and GFR of 21 on 07/03/2024.  Acute kidney injury secondary to volume overload with IV diuresis. Creatinine baseline on Ed arrival, now 4.2. Was given IV furosemide for diuresis. Now held.   Creatinine rose again today. Denies signs of uremia. Patient did receive IVF for 1 day. Will monitor am labs. Did discuss with patient the need for renal replacement therapy if labs not improved. He is agreeable if needed. Will order Hepatitis B panel in am to prepare for HD.    Lab Results  Component Value  Date   CREATININE 4.96 (H) 08/01/2024   CREATININE 4.26 (H) 07/31/2024   CREATININE 3.54 (H) 07/30/2024    Intake/Output Summary (Last 24 hours) at 08/01/2024 1347 Last data filed at 08/01/2024 0900 Gross per 24 hour  Intake 480 ml  Output 925 ml  Net -445 ml   2. Chronic systolic heart failure , ECHO completed on 06/07/24 shows EF 40%. Prescribed Torsemide  40mg  twice daily, then decreased to daily, per patient. Will evaluate this regimen prior to discharge.   3. Anemia of chronic kidney disease  Lab Results  Component Value Date   HGB 9.2 (L) 08/01/2024    Will continue to monitor.   4. Hypertension with chronic kidney disease. Receiving amlodipine , carvedilol, hydralazine , and isosorbide. Blood pressure stable   LOS: 3 Tachina Spoonemore 12/4/20251:47 PM

## 2024-08-02 ENCOUNTER — Encounter
Admission: EM | Disposition: A | Discharge status: Skilled Nursing Facility | Payer: Self-pay | Source: Home / Self Care | Attending: Internal Medicine

## 2024-08-02 DIAGNOSIS — N186 End stage renal disease: Secondary | ICD-10-CM | POA: Diagnosis not present

## 2024-08-02 DIAGNOSIS — J441 Chronic obstructive pulmonary disease with (acute) exacerbation: Secondary | ICD-10-CM

## 2024-08-02 DIAGNOSIS — I10 Essential (primary) hypertension: Secondary | ICD-10-CM

## 2024-08-02 DIAGNOSIS — I5023 Acute on chronic systolic (congestive) heart failure: Secondary | ICD-10-CM | POA: Diagnosis not present

## 2024-08-02 DIAGNOSIS — N179 Acute kidney failure, unspecified: Secondary | ICD-10-CM

## 2024-08-02 DIAGNOSIS — Z4901 Encounter for fitting and adjustment of extracorporeal dialysis catheter: Secondary | ICD-10-CM

## 2024-08-02 DIAGNOSIS — N289 Disorder of kidney and ureter, unspecified: Secondary | ICD-10-CM

## 2024-08-02 DIAGNOSIS — R0902 Hypoxemia: Secondary | ICD-10-CM

## 2024-08-02 HISTORY — PX: TEMPORARY DIALYSIS CATHETER: CATH118312

## 2024-08-02 LAB — CBC
HCT: 28.7 % — ABNORMAL LOW (ref 39.0–52.0)
Hemoglobin: 9 g/dL — ABNORMAL LOW (ref 13.0–17.0)
MCH: 26.5 pg (ref 26.0–34.0)
MCHC: 31.4 g/dL (ref 30.0–36.0)
MCV: 84.4 fL (ref 80.0–100.0)
Platelets: 234 K/uL (ref 150–400)
RBC: 3.4 MIL/uL — ABNORMAL LOW (ref 4.22–5.81)
RDW: 15.9 % — ABNORMAL HIGH (ref 11.5–15.5)
WBC: 9.3 K/uL (ref 4.0–10.5)
nRBC: 0 % (ref 0.0–0.2)

## 2024-08-02 LAB — GLUCOSE, CAPILLARY
Glucose-Capillary: 124 mg/dL — ABNORMAL HIGH (ref 70–99)
Glucose-Capillary: 94 mg/dL (ref 70–99)
Glucose-Capillary: 174 mg/dL — ABNORMAL HIGH (ref 70–99)

## 2024-08-02 LAB — RENAL FUNCTION PANEL
Albumin: 3.7 g/dL (ref 3.5–5.0)
Anion gap: 16 — ABNORMAL HIGH (ref 5–15)
BUN: 118 mg/dL — ABNORMAL HIGH (ref 8–23)
CO2: 21 mmol/L — ABNORMAL LOW (ref 22–32)
Calcium: 7.8 mg/dL — ABNORMAL LOW (ref 8.9–10.3)
Chloride: 101 mmol/L (ref 98–111)
Creatinine, Ser: 5.55 mg/dL — ABNORMAL HIGH (ref 0.61–1.24)
GFR, Estimated: 10 mL/min — ABNORMAL LOW (ref >60)
Glucose, Bld: 122 mg/dL — ABNORMAL HIGH (ref 70–99)
Phosphorus: 7.6 mg/dL — ABNORMAL HIGH (ref 2.5–4.6)
Potassium: 4.7 mmol/L (ref 3.5–5.1)
Sodium: 137 mmol/L (ref 135–145)

## 2024-08-02 LAB — HEPATITIS B SURFACE ANTIGEN: Hepatitis B Surface Ag: NONREACTIVE

## 2024-08-02 SURGERY — TEMPORARY DIALYSIS CATHETER
Anesthesia: LOCAL

## 2024-08-02 MED ORDER — CHLORHEXIDINE GLUCONATE CLOTH 2 % EX PADS
6.0000 | MEDICATED_PAD | Freq: Every day | CUTANEOUS | Status: DC
  Administered 2024-08-03 – 2024-08-09 (×7): 6 via TOPICAL

## 2024-08-02 MED ORDER — HEPARIN SODIUM (PORCINE) 1000 UNIT/ML DIALYSIS: 1000.0000 [IU] | INTRAMUSCULAR | Status: DC | PRN

## 2024-08-02 MED ORDER — SODIUM CHLORIDE 0.9 % IV SOLN: INTRAVENOUS | Status: DC

## 2024-08-02 MED ORDER — HEPARIN SODIUM (PORCINE) 1000 UNIT/ML IJ SOLN
INTRAMUSCULAR | Status: AC
  Filled 2024-08-02: qty 4

## 2024-08-02 MED ORDER — ALTEPLASE 2 MG IJ SOLR: 2.0000 mg | Freq: Once | INTRAMUSCULAR | Status: DC | PRN

## 2024-08-02 SURGICAL SUPPLY — 2 items
COVER PROBE ULTRASOUND 5X96 (MISCELLANEOUS) IMPLANT
KIT DIALYSIS CATH TRI 30X13 (CATHETERS) IMPLANT

## 2024-08-02 NOTE — Op Note (Signed)
  OPERATIVE NOTE   PROCEDURE: Insertion of temporary dialysis catheter catheter right femoral approach.  PRE-OPERATIVE DIAGNOSIS: Acute on chronic renal insufficiency requiring hemodialysis  POST-OPERATIVE DIAGNOSIS: Same  SURGEON: Cordella KANDICE Shawl M.D.  ANESTHESIA: 1% lidocaine  local infiltration  ESTIMATED BLOOD LOSS: Minimal cc  INDICATIONS:   Eric Browning is a 73 y.o. adult who presents with worsening of his uremic symptoms.  He will now require initiation of hemodialysis and therefore a temporary catheter is recommended.  Risks and benefits have been reviewed all questions answered patient agrees to proceed.  DESCRIPTION: After obtaining full informed written consent, the patient was positioned supine. The right groin was prepped and draped in a sterile fashion. Ultrasound was placed in a sterile sleeve. Ultrasound was utilized to identify the right common femoral vein which is noted to be echolucent and compressible indicating patency. Images recorded for the permanent record. Under real-time visualization a Seldinger needle is inserted into the vein and the guidewires advanced without difficulty. Small counterincision was made at the wire insertion site. Dilator is passed over the wire and the temporary dialysis catheter catheter is fed over the wire without difficulty.  All lumens aspirate and flush easily and are packed with heparin  saline. Catheter secured to the skin of the right thigh with 2-0 silk. A sterile dressing is applied with Biopatch.  COMPLICATIONS: None  CONDITION: Unchanged  Cordella Shawl Office:  [phone] 08/02/2024, 2:52 PM

## 2024-08-02 NOTE — Progress Notes (Signed)
 [facility]  ROUNDING NOTE   Subjective:   Eric Browning is a 73 y.o. male with past medical history of diabetes, peripheral vascular disease, hypertension, congestive heart failure, hyperlipidemia, gout, BPH and chronic kidney disease with anemia and proteinuria. Patient presents to ED with shortness of breath and has been admitted for CHF (congestive heart failure) (HCC) [I50.9] SOB (shortness of breath) [R06.02] Hypoxia [R09.02] Acute on chronic systolic congestive heart failure (HCC) [I50.23] Acute respiratory failure with hypoxia and hypercarbia (HCC) [J96.01, J96.02]  Patient is known to our practice and was seen by Dr Dennise in October.   Update Patient seen resting in bed Alert and oriented Remains on room air without shortness of breath Lower extremity edema unchanged from yesterday.   Creatinine 5.55   Objective:  Vital signs in last 24 hours:  Temp:  [97.5 F (36.4 C)-98.1 F (36.7 C)] 97.6 F (36.4 C) (12/05 1509) Pulse Rate:  [64-75] 68 (12/05 1530) Resp:  [14-20] 19 (12/05 1530) BP: (94-129)/(38-75) 124/68 (12/05 1530) SpO2:  [92 %-100 %] 98 % (12/05 1530) Weight:  [81.6 kg-90.3 kg] 90.3 kg (12/05 1509)  Weight change: 0 kg Filed Weights   08/01/24 0500 08/02/24 0500 08/02/24 1509  Weight: 81.6 kg 81.6 kg 90.3 kg    Intake/Output: I/O last 3 completed shifts: In: 2036.6 [P.O.:440; I.V.:1596.6] Out: 1400 [Urine:1400]   Intake/Output this shift:  Total I/O In: 120 [P.O.:120] Out: -   Physical Exam: General: NAD  Head: Normocephalic, atraumatic. Moist oral mucosal membranes  Eyes: Anicteric  Lungs:  Clear to auscultation, normal effort  Heart: Regular rate and rhythm  Abdomen:  Soft, nontender  Extremities:  1+ peripheral edema.  Neurologic: Awake, alert, conversant  Skin: Warm,dry, no rash       Basic Metabolic Panel: Recent Labs  Lab 07/29/24 0800 07/30/24 0437 07/31/24 0406 08/01/24 0410 08/02/24 0908  NA 147* 145 141 136  137  K 4.1 4.1 4.4 4.6 4.7  CL 109 106 103 99 101  CO2 28 28 26 24  21*  GLUCOSE 130* 133* 141* 164* 122*  BUN 36* 51* 74* 96* 118*  CREATININE 2.90* 3.54* 4.26* 4.96* 5.55*  CALCIUM  9.4 8.7* 8.3* 7.9* 7.8*  MG  --   --   --  2.8*  --   PHOS  --   --   --   --  7.6*    Liver Function Tests: Recent Labs  Lab 07/29/24 0800 08/02/24 0908  AST 11*  --   ALT 7  --   ALKPHOS 98  --   BILITOT 0.3  --   PROT 7.1  --   ALBUMIN 4.2 3.7   No results for input(s): LIPASE, AMYLASE in the last 168 hours. No results for input(s): AMMONIA in the last 168 hours.  CBC: Recent Labs  Lab 07/29/24 0800 07/31/24 0406 08/01/24 0410 08/02/24 1531  WBC 7.1 13.8* 12.0* 9.3  HGB 10.5* 9.2* 9.2* 9.0*  HCT 33.7* 29.3* 29.9* 28.7*  MCV 85.3 85.2 84.9 84.4  PLT 343 311 280 234    Cardiac Enzymes: No results for input(s): CKTOTAL, CKMB, CKMBINDEX, TROPONINI in the last 168 hours.  BNP: Invalid input(s): POCBNP  CBG: Recent Labs  Lab 08/01/24 1201 08/01/24 1800 08/01/24 2121 08/02/24 0804 08/02/24 1222  GLUCAP 118* 180* 147* 124* 94    Microbiology: Results for orders placed or performed during the hospital encounter of 07/29/24  Resp panel by RT-PCR (RSV, Flu A&B, Covid) Anterior Nasal Swab  Status: None   Collection Time: 07/29/24  7:57 AM   Specimen: Anterior Nasal Swab  Result Value Ref Range Status   SARS Coronavirus 2 by RT PCR NEGATIVE NEGATIVE Final    Comment: (NOTE) SARS-CoV-2 target nucleic acids are NOT DETECTED.  The SARS-CoV-2 RNA is generally detectable in upper respiratory specimens during the acute phase of infection. The lowest concentration of SARS-CoV-2 viral copies this assay can detect is 138 copies/mL. A negative result does not preclude SARS-Cov-2 infection and should not be used as the sole basis for treatment or other patient management decisions. A negative result may occur with  improper specimen collection/handling, submission of  specimen other than nasopharyngeal swab, presence of viral mutation(s) within the areas targeted by this assay, and inadequate number of viral copies(<138 copies/mL). A negative result must be combined with clinical observations, patient history, and epidemiological information. The expected result is Negative.  Fact Sheet for Patients:  bloggercourse.com  Fact Sheet for Healthcare Providers:  seriousbroker.it  This test is no t yet approved or cleared by the [place]  FDA and  has been authorized for detection and/or diagnosis of SARS-CoV-2 by FDA under an Emergency Use Authorization (EUA). This EUA will remain  in effect (meaning this test can be used) for the duration of the COVID-19 declaration under Section 564(b)(1) of the Act, 21 U.S.C.section 360bbb-3(b)(1), unless the authorization is terminated  or revoked sooner.       Influenza A by PCR NEGATIVE NEGATIVE Final   Influenza B by PCR NEGATIVE NEGATIVE Final    Comment: (NOTE) The Xpert Xpress SARS-CoV-2/FLU/RSV plus assay is intended as an aid in the diagnosis of influenza from Nasopharyngeal swab specimens and should not be used as a sole basis for treatment. Nasal washings and aspirates are unacceptable for Xpert Xpress SARS-CoV-2/FLU/RSV testing.  Fact Sheet for Patients: bloggercourse.com  Fact Sheet for Healthcare Providers: seriousbroker.it  This test is not yet approved or cleared by the [place]  FDA and has been authorized for detection and/or diagnosis of SARS-CoV-2 by FDA under an Emergency Use Authorization (EUA). This EUA will remain in effect (meaning this test can be used) for the duration of the COVID-19 declaration under Section 564(b)(1) of the Act, 21 U.S.C. section 360bbb-3(b)(1), unless the authorization is terminated or revoked.     Resp Syncytial Virus by PCR NEGATIVE NEGATIVE Final     Comment: (NOTE) Fact Sheet for Patients: bloggercourse.com  Fact Sheet for Healthcare Providers: seriousbroker.it  This test is not yet approved or cleared by the [place]  FDA and has been authorized for detection and/or diagnosis of SARS-CoV-2 by FDA under an Emergency Use Authorization (EUA). This EUA will remain in effect (meaning this test can be used) for the duration of the COVID-19 declaration under Section 564(b)(1) of the Act, 21 U.S.C. section 360bbb-3(b)(1), unless the authorization is terminated or revoked.  Performed at [facility], [address] [place], [place] [address]     Coagulation Studies: No results for input(s): LABPROT, INR in the last 72 hours.   Urinalysis: No results for input(s): COLORURINE, LABSPEC, PHURINE, GLUCOSEU, HGBUR, BILIRUBINUR, KETONESUR, PROTEINUR, UROBILINOGEN, NITRITE, LEUKOCYTESUR in the last 72 hours.  Invalid input(s): APPERANCEUR    Imaging: PERIPHERAL VASCULAR CATHETERIZATION Result Date: 08/02/2024 See surgical note for result.    Medications:      amLODipine   5 mg Oral Daily   aspirin  EC  81 mg Oral Daily   carvedilol   12.5 mg Oral BID   [START ON 08/03/2024] Chlorhexidine   Gluconate Cloth  6 each Topical Q0600   ezetimibe   10 mg Oral Daily   fluticasone  furoate-vilanterol  1 puff Inhalation Daily   guaiFENesin   1,200 mg Oral BID   heparin   5,000 Units Subcutaneous Q12H   hydrALAZINE   25 mg Oral Q8H   insulin  aspart  0-9 Units Subcutaneous TID WC   ipratropium-albuterol   3 mL Nebulization BID   isosorbide  mononitrate  30 mg Oral Daily   pravastatin   20 mg Oral QHS   predniSONE   40 mg Oral Q breakfast   tamsulosin   0.4 mg Oral Daily   acetaminophen , albuterol , hydrALAZINE , ondansetron  (ZOFRAN ) IV  Assessment/ Plan:  Eric Browning is a 73 y.o.  adult with past medical history of diabetes, peripheral vascular  disease, hypertension, congestive heart failure, hyperlipidemia, gout, BPH and chronic kidney disease with anemia and proteinuria. Patient presents to ED with shortness of breath and has been admitted for CHF (congestive heart failure) (HCC) [I50.9] SOB (shortness of breath) [R06.02] Hypoxia [R09.02] Acute on chronic systolic congestive heart failure (HCC) [I50.23] Acute respiratory failure with hypoxia and hypercarbia (HCC) [J96.01, J96.02]   Acute Kidney Injury on chronic kidney disease stage 4 with baseline creatinine 3.0 and GFR of 21 on 07/03/2024.  Acute kidney injury secondary to volume overload with IV diuresis. Creatinine baseline on Ed arrival, now 4.2. Was given IV furosemide  for diuresis. Now held.   Creatinine elevated again today, 5.55. BUN 118, denies uremic symptoms. We feel it is time to proceed with dialysis and patient agrees. Vascular consulted to place HD temp cath. Will initiate dialysis after placement. Will plan for second treatment tomorrow. IVF stopped   Lab Results  Component Value Date   CREATININE 5.55 (H) 08/02/2024   CREATININE 4.96 (H) 08/01/2024   CREATININE 4.26 (H) 07/31/2024    Intake/Output Summary (Last 24 hours) at 08/02/2024 1540 Last data filed at 08/02/2024 0929 Gross per 24 hour  Intake 2156.64 ml  Output 750 ml  Net 1406.64 ml   2. Chronic systolic heart failure , ECHO completed on 06/07/24 shows EF 40%. Prescribed Torsemide  40mg  twice daily, then decreased to daily, per patient.  Diuretics held due to renal function.   3. Anemia of chronic kidney disease  Lab Results  Component Value Date   HGB 9.0 (L) 08/02/2024    Hgb borderline. Will continue to assess for and likely order low dose Retacrit tomorrow  4. Hypertension with chronic kidney disease. Receiving amlodipine , carvedilol , hydralazine , and isosorbide . Blood pressure stable   LOS: 4 Shuntavia Yerby 12/5/20253:40 PM

## 2024-08-02 NOTE — Plan of Care (Signed)

## 2024-08-02 NOTE — Progress Notes (Signed)
 Hemodialysis Note:  Received patient in bed to unit. Alert and oriented. Informed consent singed and in chart.  Treatment initiated: 1530 Treatment completed: 1730  Access used: Right femoral catheter Access issues: None  Patient tolerated well. Transported back to room, alert without acute distress. Report given to patient's RN.  Total UF removed: 0 Medications given: None  Post HD weight: 90.3 Kg  [facility]

## 2024-08-02 NOTE — Consult Note (Signed)
 Hospital Consult    Reason for Consult:  Placement of Temporary Dialysis catheter Requesting Physician:  Faith Harris NP  MRN #:  [id number]  History of Present Illness: This is a 73 y.o. adult male with past medical history of hypertension, gout, heart failure reduced ejection fraction, non-insulin -dependent diabetes mellitus type 2, severe COPD, pulmonary hypertension, hyperlipidemia, BPH, presented to hospital with shortness of breath and was noted to be hypoxic on room air at 88% as per EMS.  He was initially on BiPAP as well.  Chest x-ray showed pulmonary vascular congestion.  Labs were notable for creatinine of 2.9 with sodium of 147.  Initially patient was given IV Lasix  40 mg IV Solu-Medrol , DuoNebs and was admitted to hospital for acute on chronic hypoxic respiratory failure secondary to heart failure decompensation and was started on IV diuresis.  Subsequently, had some worsening of the renal function, Lasix  was discontinued and nephrology was consulted.  Nephrology now feels the patient needs a temporary dialysis catheter placed for the start of hemodialysis. Vascular surgery consulted for placement.   Past Medical History:  Diagnosis Date   Angina at rest    Arthritis of both hands    Bilateral cataracts    Bilateral renal cysts    BPH (benign prostatic hyperplasia)    CAD (coronary artery disease)    a.) LHC/PCI 12/31/2020: 90% dRCA (3.0 x 15 mm Resolute Onyx DES); 50% pRCA, minor lum irregs in LAD and LCx   Cardiomegaly    Cardiomyopathy (HCC)    a.) TTE 07/03/2015: EF 50%; b.) MV 07/03/2015: EF 53%; c.) TTE 01/08/2019: EF 45%; d.) MV 01/08/2019: EF 45-50%; e.) TTE 11/26/2020: EF 45%; f.) MV 11/26/2020: EF 51%; g.) TTE 03/06/2023: EF 40%   CKD (chronic kidney disease), stage III (HCC)    CVA (cerebral vascular accident) (HCC)    a.) noted on CT head 03/12/2017: RIGHT posterior  parietal encephalomalacia consistent with small chronic temporoparietal infarct   DDD  (degenerative disc disease), cervical    Dyspnea on exertion    GERD (gastroesophageal reflux disease)    Gout    Heart murmur    HFrEF (heart failure with reduced ejection fraction) (HCC)    a.) TTE 07/03/2015: EF 50%, mild-mod LVH, inf/post HK, G1DD, triv MR/PR, mild AR/TR, RVSP 36.8; b.) TTE 01/08/2019: EF 45%, sep/apical HK, mild LVH, mild LAE, mild panval regurg, RVSP 37.2; d.) TTE 11/26/2020: EF 45%, apical.inf/post HK, G1DD, mild LAE/LVE, triv AR/PR, mild MR/TR, RVSP 52.9; e.)  TTE 03/06/2023: EF 40%, sep/apical/inf HK, sev LAE, mild AR/TR/PR, mod MR, RVSP 35.3   Hiatal hernia    High cholesterol    Hypertension    IDA (iron deficiency anemia)    Left renal mass 12/13/2022   a.) US  renal 12/13/2022: 2.7 x 2.4 x 2.5 cm; b.) MRI abdomen 12/22/2022: 2.9 x 2.6 x 2.4 cm LEFT anterior upper kidney; c.) MRI abdomen 06/14/2023: interval increase in size to 2.8 x 3.2 cm --> 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   Long term current use of aspirin     OSA on CPAP    Primary hypogonadism in male    Stenosis of left vertebral artery (severe)    T2DM (type 2 diabetes mellitus) (HCC)     Past Surgical History:  Procedure Laterality Date   CATARACT EXTRACTION W/ INTRAOCULAR LENS IMPLANT Right 2013   COLONOSCOPY     COLONOSCOPY N/A 04/06/2022   Procedure: COLONOSCOPY;  Surgeon: Toledo, Ladell POUR, MD;  Location: [facility] ENDOSCOPY;  Service:  Gastroenterology;  Laterality: N/A;   COLONOSCOPY WITH PROPOFOL  N/A 08/03/2015   Procedure: COLONOSCOPY WITH PROPOFOL ;  Surgeon: Lamar ONEIDA Holmes, MD;  Location: [facility] ENDOSCOPY;  Service: Endoscopy;  Laterality: N/A;   CORONARY STENT INTERVENTION N/A 12/31/2020   Procedure: CORONARY STENT INTERVENTION;  Surgeon: Florencio Cara BIRCH, MD;  Location: [facility] INVASIVE CV LAB;  Service: Cardiovascular;  Laterality: N/A;  RCA   EUS N/A 09/03/2015   Procedure: LOWER ENDOSCOPIC ULTRASOUND (EUS);  Surgeon: Asberry DELENA Coffee, MD;  Location: [facility] ENDOSCOPY;  Service:  Endoscopy;  Laterality: N/A;   IR RADIOLOGIST EVAL & MGMT  08/24/2023   IR RADIOLOGIST EVAL & MGMT  09/28/2023   IR RADIOLOGIST EVAL & MGMT  06/06/2024   LEFT HEART CATH AND CORONARY ANGIOGRAPHY N/A 12/31/2020   Procedure: LEFT HEART CATH AND CORONARY ANGIOGRAPHY;  Surgeon: Florencio Cara BIRCH, MD;  Location: [facility] INVASIVE CV LAB;  Service: Cardiovascular;  Laterality: N/A;   REVERSE SHOULDER ARTHROPLASTY Right 12/12/2017   Procedure: REVERSE SHOULDER ARTHROPLASTY;  Surgeon: Edie Norleen PARAS, MD;  Location: [facility] ORS;  Service: Orthopedics;  Laterality: Right;   RIGHT HEART CATH Right 06/12/2024   Procedure: RIGHT HEART CATH;  Surgeon: Florencio Cara BIRCH, MD;  Location: [facility] INVASIVE CV LAB;  Service: Cardiovascular;  Laterality: Right;   TONSILLECTOMY     TOTAL KNEE ARTHROPLASTY Right 05/12/2016   Procedure: TOTAL KNEE ARTHROPLASTY;  Surgeon: Norleen PARAS Edie, MD;  Location: [facility] ORS;  Service: Orthopedics;  Laterality: Right;    No Known Allergies  Prior to Admission medications   Medication Sig Start Date End Date Taking? Authorizing Provider  albuterol  (VENTOLIN  HFA) 108 (90 Base) MCG/ACT inhaler Inhale 1-2 puffs into the lungs every 6 (six) hours as needed for wheezing or shortness of breath.   Yes [provider]  allopurinol  (ZYLOPRIM ) 100 MG tablet Take 100 mg by mouth daily.   Yes [provider]  amLODipine  (NORVASC ) 5 MG tablet Take 5 mg by mouth daily. 05/07/24  Yes [provider]  aspirin  EC 81 MG tablet Take 81 mg by mouth daily. Swallow whole.   Yes [provider]  carvedilol  (COREG ) 12.5 MG tablet Take 12.5 mg by mouth 2 (two) times daily.   Yes [provider]  cyanocobalamin  (VITAMIN B12) 500 MCG tablet Take 500 mcg by mouth daily.   Yes [provider]  dapagliflozin propanediol (FARXIGA) 10 MG TABS tablet Take 10 mg by mouth daily. 06/18/24 06/18/25 Yes [provider]  ezetimibe  (ZETIA ) 10 MG tablet Take 10 mg by mouth  daily.   Yes [provider]  Fe Bisgly-Vit C-Vit B12-FA (GENTLE IRON PO) Take 2 tablets by mouth daily.   Yes [provider]  hydrALAZINE  (APRESOLINE ) 25 MG tablet Take 25 mg by mouth.  Take 1 tablet (25 mg total) by mouth in the morning and 1 tablet (25 mg total) at noon and 1 tablet (25 mg total) in the evening. 06/18/24 06/18/25 Yes [provider]  hydrochlorothiazide  (HYDRODIURIL ) 25 MG tablet Take 25 mg by mouth daily.   Yes [provider]  metoprolol  succinate (TOPROL -XL) 25 MG 24 hr tablet Take 25 mg by mouth daily. 05/13/24 05/13/25 Yes [provider]  OHTUVAYRE  3 MG/2.5ML SUSP  07/22/24  Yes [provider]  olmesartan  (BENICAR ) 40 MG tablet Take 40 mg by mouth daily. 12/07/20  Yes [provider]  potassium chloride  SA (K-DUR,KLOR-CON ) 20 MEQ tablet Take 20 mEq by mouth 3 (three) times daily.    Yes [provider]  pravastatin  (PRAVACHOL ) 20 MG tablet Take 20 mg by mouth daily. 05/25/23  Yes [provider]  tamsulosin  (FLOMAX ) 0.4 MG CAPS capsule Take 0.4 mg by mouth daily. 09/17/20  Yes [provider]  torsemide  (DEMADEX ) 20 MG tablet Take 40 mg by mouth daily. 06/06/24  Yes [provider]  amLODipine  (NORVASC ) 10 MG tablet Take 1 tablet (10 mg total) by mouth daily. Patient not taking: Reported on 07/29/2024 01/02/21   Callwood, Dwayne D, MD  clopidogrel (PLAVIX) 75 MG tablet Take 75 mg by mouth once. Patient not taking: Reported on 07/29/2024    [provider]  Colchicine  0.6 MG CAPS Take 1 capsule by mouth daily. Patient not taking: Reported on 07/29/2024    [provider]  metFORMIN  (GLUCOPHAGE -XR) 500 MG 24 hr tablet Take 500 mg by mouth daily. Patient not taking: Reported on 07/29/2024 07/01/24   [provider]  NON FORMULARY Pt uses a cpap nightly    [provider]    Social History   Socioeconomic History   Marital status: Married     Spouse name: Not on file   Number of children: Not on file   Years of education: Not on file   Highest education level: Not on file  Occupational History   Occupation: works in cigarette factory  Tobacco Use   Smoking status: Former    Current packs/day: 0.00    Average packs/day: 1 pack/day for 30.0 years (30.0 ttl pk-yrs)    Types: Cigarettes    Start date: 09/28/1958    Quit date: 09/28/1988    Years since quitting: 35.8   Smokeless tobacco: Never   Tobacco comments:    quit 1990  Vaping Use   Vaping status: Never Used  Substance and Sexual Activity   Alcohol use: No   Drug use: No   Sexual activity: Yes  Other Topics Concern   Not on file  Social History Narrative   Not on file   Social Drivers of Health   Financial Resource Strain: Low Risk  (07/10/2024)   Received from [facility]   Overall Financial Resource Strain (CARDIA)    Difficulty of Paying Living Expenses: Not hard at all  Food Insecurity: No Food Insecurity (07/29/2024)   Hunger Vital Sign    Worried About Running Out of Food in the Last Year: Never true    Ran Out of Food in the Last Year: Never true  Transportation Needs: No Transportation Needs (07/29/2024)   PRAPARE - Administrator, Civil Service (Medical): No    Lack of Transportation (Non-Medical): No  Physical Activity: Not on file  Stress: Not on file  Social Connections: Unknown (07/29/2024)   Social Connection and Isolation Panel    Frequency of Communication with Friends and Family: More than three times a week    Frequency of Social Gatherings with Friends and Family: More than three times a week    Attends Religious Services: Patient declined    Database Administrator or Organizations: Patient declined    Attends Banker Meetings: Patient declined    Marital Status: Married  Catering Manager Violence: Not At Risk (07/29/2024)   Humiliation, Afraid, Rape, and Kick questionnaire    Fear of Current or  Ex-Partner: No    Emotionally Abused: No    Physically Abused: No    Sexually Abused: No     Family History  Problem Relation Age of Onset  Heart disease Mother    Stroke Father    Hypertension Father    Gout Father    Cancer Brother    Bone cancer Son    Brain cancer Son     ROS: Otherwise negative unless mentioned in HPI  Physical Examination  Vitals:   08/02/24 0527 08/02/24 0812  BP: 97/60 (!) 94/50  Pulse: 65 68  Resp:  14  Temp:  97.6 F (36.4 C)  SpO2:  94%   Body mass index is 28.18 kg/m.  General:  WDWN in NAD Gait: Not observed HENT: WNL, normocephalic Pulmonary: normal non-labored breathing, without Rales, rhonchi,  wheezing Cardiac: regular, without  Murmurs, rubs or gallops; without carotid bruits Abdomen: Positive bowel sounds throughout, soft, NT/ND, no masses Skin: without rashes Vascular Exam/Pulses: All extremities warm to touch, palpable pulses throughout. Extremities: without ischemic changes, without Gangrene , without cellulitis; without open wounds;  Musculoskeletal: no muscle wasting or atrophy  Neurologic: A&O X 3;  No focal weakness or paresthesias are detected; speech is fluent/normal Psychiatric:  The pt has Normal affect. Lymph:  Unremarkable  CBC    Component Value Date/Time   WBC 12.0 (H) 08/01/2024 0410   RBC 3.52 (L) 08/01/2024 0410   HGB 9.2 (L) 08/01/2024 0410   HGB 9.4 (L) 04/24/2024 1409   HCT 29.9 (L) 08/01/2024 0410   PLT 280 08/01/2024 0410   PLT 308 04/24/2024 1409   MCV 84.9 08/01/2024 0410   MCH 26.1 08/01/2024 0410   MCHC 30.8 08/01/2024 0410   RDW 15.9 (H) 08/01/2024 0410   LYMPHSABS 1.3 04/24/2024 1409   MONOABS 0.7 04/24/2024 1409   EOSABS 0.2 04/24/2024 1409   BASOSABS 0.1 04/24/2024 1409    BMET    Component Value Date/Time   NA 137 08/02/2024 0908   K 4.7 08/02/2024 0908   K 3.6 10/31/2011 0614   CL 101 08/02/2024 0908   CO2 21 (L) 08/02/2024 0908   GLUCOSE 122 (H) 08/02/2024 0908   BUN 118  (H) 08/02/2024 0908   CREATININE 5.55 (H) 08/02/2024 0908   CREATININE 3.00 (H) 04/24/2024 1409   CALCIUM  7.8 (L) 08/02/2024 0908   GFRNONAA 10 (L) 08/02/2024 0908   GFRNONAA 21 (L) 04/24/2024 1409   GFRAA >60 12/13/2017 0716    COAGS: Lab Results  Component Value Date   INR 1.1 07/29/2024   INR 1.0 09/11/2023   INR 0.91 12/04/2017     Non-Invasive Vascular Imaging:   None Ordered  Statin:  Yes.   Beta Blocker:  Yes.   Aspirin :  Yes.   ACEI:  No. ARB:  Yes.   CCB use:  No Other antiplatelets/anticoagulants:  Yes.   Plavix 75 mg daily   ASSESSMENT/PLAN: This is a 73 y.o. adult with past medical history of hypertension gout, heart failure with reduced ejection fraction, diabetes mellitus type 2, COPD, pulmonary hypertension presents to [facility] emergency department with increased shortness of breath and was noted to be hypoxic on room air at 88% saturation.  Upon workup patient was now found to be in acute kidney failure.  He was given Lasix  that did not help his diuresis.  Nephrology was consulted and wishes to start hemodialysis.  They contacted vascular surgery to place temporary dialysis catheter at this time.  I had a long detailed discussion at the bedside this morning with the patient and his son concerning the procedure, benefits, risk, complications.  They both verbalized understanding and wished to proceed.  I answered all her questions  today.  Patient did have a muffin and 1 sausage patty this morning for breakfast but has not had anything to eat or drink since that time.  Patient does not need to be n.p.o. for the procedure but I asked that he not eat or drink until the procedure was finished.  Patient's lab work this morning shows a BUN of 118 and a creatinine of 5.55 with an estimated GFR of less than 10.  Patient's last CBC was on 07/31/2024.  He had a hemoglobin of 9.2 and hematocrit of 29.3 at that time.  Platelets of 311.   -I discussed the patient's case in detail  with Dr. Cordella Shawl MD and he agrees with the plan.   [name] [facility] 08/02/2024 11:31 AM

## 2024-08-02 NOTE — Progress Notes (Signed)
 [facility]  - [place] at [facility]   PATIENT NAME: Eric Browning    MR#:  [id number]  DATE OF BIRTH:  [birth year]  SUBJECTIVE:   No family at bedside. Patient denies any complaints. Received IV fluids. Creatinine continues to trend up to 5.55.   VITALS:  Blood pressure 137/81, pulse 82, temperature 97.6 F (36.4 C), temperature source Oral, resp. rate (!) 21, height 5' 7 (1.702 m), weight 90.3 kg, SpO2 97%.  PHYSICAL EXAMINATION:   GENERAL:  73 y.o.-year-old patient with no acute distress.  LUNGS: Normal breath sounds bilaterally, no wheezing CARDIOVASCULAR: S1, S2 normal. No murmur   ABDOMEN: Soft, nontender, nondistended. Bowel sounds present.  EXTREMITIES: No  edema b/l.    NEUROLOGIC: nonfocal  patient is alert and awake SKIN: No obvious rash, lesion, or ulcer.   LABORATORY PANEL:  CBC Recent Labs  Lab 08/02/24 1531  WBC 9.3  HGB 9.0*  HCT 28.7*  PLT 234    Chemistries  Recent Labs  Lab 07/29/24 0800 07/30/24 0437 08/01/24 0410 08/02/24 0908  NA 147*   < > 136 137  K 4.1   < > 4.6 4.7  CL 109   < > 99 101  CO2 28   < > 24 21*  GLUCOSE 130*   < > 164* 122*  BUN 36*   < > 96* 118*  CREATININE 2.90*   < > 4.96* 5.55*  CALCIUM  9.4   < > 7.9* 7.8*  MG  --   --  2.8*  --   AST 11*  --   --   --   ALT 7  --   --   --   ALKPHOS 98  --   --   --   BILITOT 0.3  --   --   --    < > = values in this interval not displayed.      Assessment and Plan   Eric Browning is a 73 year old male with past medical history of hypertension, gout, heart failure reduced ejection fraction, non-insulin -dependent diabetes mellitus type 2, severe COPD, pulmonary hypertension, hyperlipidemia, BPH, presented to hospital with shortness of breath and was noted to be hypoxic on room air at 88% as per EMS.  He was initially on BiPAP as well.  Chest x-ray showed pulmonary vascular congestion.  Labs were notable for creatinine of 2.9 with sodium of 147.  Initially patient  was given IV Lasix  40 mg IV Solu-Medrol , DuoNebs and was admitted to hospital for acute on chronic hypoxic respiratory failure secondary to heart failure decompensation and was started on IV diuresis.  Subsequently, had some worsening of the renal function, Lasix  was discontinued and nephrology was consulted.   Acute on chronic congestive heart failure with reduced ejection fraction. Improved symptoms.  Continue strict intake and output charting.  Lasix  currently on hold due to AKI.  Continue Coreg .   Currently on room air   COPD with acute exacerbation Improved.  Continue albuterol  nebulizer, will change IV Solu-Medrol  to prednisone .  Continue incentive spirometry and supportive care.  Mild leukocytosis but patient is on steroids.   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  Was initially on 60 mg of Lasix  IV twice daily.  Nephrology on board.  Renal ultrasound without any acute abnormality.  -- Creatinine today at 5.5 rom 4.2 <3.5. --  Initial creatinine was 2.9.  Patient was started on gentle IV fluids since yesterday.   --  Continue to hold Farxiga. -- Nephrology recommend starting dialysis-- school consulted for temporary cath placement   Essential hypertension --Patient is on amlodipine , carvedilol   hydralazine  , Imdur      Type 2 diabetes mellitus without complication, without long-term current use of insulin  (HCC) Continue sliding scale insulin .  Hold metformin  for now.  Glycemic control is adequate at this time   Pure hypercholesterolemia Continue ezetimibe ,pravastatin     Personal history of gout Hold allopurinol  100 mg daily due to AKI.    Procedures: temporary catheter for dialysis placement Family communication : none by me. Nephrology spoke with son Consults : vascular, nephrology CODE STATUS: full DVT Prophylaxis : heparin  Level of care: Progressive Status is: Inpatient Remains inpatient appropriate because: new  dialysis    TOTAL TIME TAKING CARE OF THIS PATIENT: 40 minutes.  >50% time spent on counselling and coordination of care  Note: This dictation was prepared with Dragon dictation along with smaller phrase technology. Any transcriptional errors that result from this process are unintentional.  Eric Browning M.D    [facility]   CC: Primary care physician; Alla Amis, MD

## 2024-08-02 NOTE — TOC Initial Note (Signed)
 Transition of Care [facility]) - Initial/Assessment Note    Patient Details  Name: Eric Browning MRN: [id number] Date of Birth: 01/17/1951  Transition of Care [facility]) CM/SW Contact:    Alfonso Rummer, LCSW Phone Number: 08/02/2024, 3:16 PM  Clinical Narrative:                  LCSW A. Rummer met with pt and son Kyian Obst at bedside in room 239b. Pt was alert and oriented during TOC visit. LCSW A. Hajra Port encouraged pt and pt son to consider apply for pharmaceutical financial assistance to offset cost. Additionally, LCSW A Rummer encourage pt to consider [facility] for home delivery. Pt son Sabri Teal resides in [place] however reports his brother lives close and able to assist if needed. Pt is requesting a referral for home health. Pt uses a cpap at home and oxygen.  Expected Discharge Plan: Home/Self Care Barriers to Discharge: Continued Medical Work up   Patient Goals and CMS Choice            Expected Discharge Plan and Services                                              Prior Living Arrangements/Services     Patient language and need for interpreter reviewed:: No Do you feel safe going back to the place where you live?: Yes      Need for Family Participation in Patient Care: Yes (Comment) Sandor Loring) Care giver support system in place?: Yes (comment)   Criminal Activity/Legal Involvement Pertinent to Current Situation/Hospitalization: No - Comment as needed  Activities of Daily Living   ADL Screening (condition at time of admission) Independently performs ADLs?: Yes (appropriate for developmental age) Is the patient deaf or have difficulty hearing?: No Does the patient have difficulty seeing, even when wearing glasses/contacts?: No Does the patient have difficulty concentrating, remembering, or making decisions?: No  Permission Sought/Granted            Permission granted to share info w Relationship: Son Khadar Monger      Emotional Assessment Appearance:: Appears stated age Attitude/Demeanor/Rapport: Engaged Affect (typically observed): Appropriate Orientation: : Oriented to Self, Oriented to Place, Oriented to  Time, Oriented to Situation   Psych Involvement: No (comment)  Admission diagnosis:  CHF (congestive heart failure) (HCC) [I50.9] SOB (shortness of breath) [R06.02] Hypoxia [R09.02] Acute on chronic systolic congestive heart failure (HCC) [I50.23] Acute respiratory failure with hypoxia and hypercarbia (HCC) [J96.01, J96.02] Patient Active Problem List   Diagnosis Date Noted   Encounter for dialysis and dialysis catheter care 08/02/2024   AKI (acute kidney injury) 07/30/2024   COPD with acute exacerbation (HCC) 07/30/2024   Acute on chronic systolic CHF (congestive heart failure) (HCC) 07/29/2024   CHF (congestive heart failure) (HCC) 07/29/2024   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 12/31/2020   Essential hypertension 05/14/2018   Obesity 05/14/2018   Primary male hypogonadism 05/14/2018   Pure hypercholesterolemia 05/14/2018   Type 2 diabetes mellitus without complication, without long-term current use of insulin  (HCC) 05/14/2018   Status post reverse total shoulder replacement, right 12/12/2017   Rotator cuff tendinitis, left 11/20/2017   Chest pain 12/09/2016   Symptomatic anemia 06/28/2016   Vaccine counseling 06/28/2016   Status post total right knee replacement using cement 05/12/2016   Personal history of gout  06/18/2015   PCP:  Alla Amis, MD Pharmacy:   [facility] - [place], [place] - [address] [place] [address] Phone: [phone] Fax: [phone]     Social Drivers of Health (SDOH) Social History: SDOH Screenings   Food Insecurity: No Food Insecurity (07/29/2024)  Housing: Unknown (07/29/2024)  Transportation Needs: No Transportation Needs (07/29/2024)  Utilities: Not At Risk  (07/29/2024)  Depression (PHQ2-9): Low Risk  (04/24/2024)  Financial Resource Strain: Low Risk  (07/10/2024)   Received from [facility]  Social Connections: Unknown (07/29/2024)  Tobacco Use: Medium Risk (07/30/2024)   SDOH Interventions:     Readmission Risk Interventions     No data to display

## 2024-08-03 DIAGNOSIS — I10 Essential (primary) hypertension: Secondary | ICD-10-CM | POA: Diagnosis not present

## 2024-08-03 DIAGNOSIS — J441 Chronic obstructive pulmonary disease with (acute) exacerbation: Secondary | ICD-10-CM | POA: Diagnosis not present

## 2024-08-03 DIAGNOSIS — I5023 Acute on chronic systolic (congestive) heart failure: Secondary | ICD-10-CM | POA: Diagnosis not present

## 2024-08-03 DIAGNOSIS — N186 End stage renal disease: Secondary | ICD-10-CM | POA: Diagnosis not present

## 2024-08-03 LAB — RENAL FUNCTION PANEL
Albumin: 3.3 g/dL — ABNORMAL LOW (ref 3.5–5.0)
Anion gap: 12 (ref 5–15)
BUN: 107 mg/dL — ABNORMAL HIGH (ref 8–23)
CO2: 25 mmol/L (ref 22–32)
Calcium: 7.5 mg/dL — ABNORMAL LOW (ref 8.9–10.3)
Chloride: 100 mmol/L (ref 98–111)
Creatinine, Ser: 5.35 mg/dL — ABNORMAL HIGH (ref 0.61–1.24)
GFR, Estimated: 10 mL/min — ABNORMAL LOW (ref >60)
Glucose, Bld: 99 mg/dL (ref 70–99)
Phosphorus: 7.7 mg/dL — ABNORMAL HIGH (ref 2.5–4.6)
Potassium: 4.6 mmol/L (ref 3.5–5.1)
Sodium: 137 mmol/L (ref 135–145)

## 2024-08-03 LAB — HEPATITIS B SURFACE ANTIBODY, QUANTITATIVE: Hep B S AB Quant (Post): <3.5 m[IU]/mL — ABNORMAL LOW

## 2024-08-03 LAB — CBC
HCT: 29.5 % — ABNORMAL LOW (ref 39.0–52.0)
Hemoglobin: 9.2 g/dL — ABNORMAL LOW (ref 13.0–17.0)
MCH: 26.7 pg (ref 26.0–34.0)
MCHC: 31.2 g/dL (ref 30.0–36.0)
MCV: 85.5 fL (ref 80.0–100.0)
Platelets: 214 K/uL (ref 150–400)
RBC: 3.45 MIL/uL — ABNORMAL LOW (ref 4.22–5.81)
RDW: 16 % — ABNORMAL HIGH (ref 11.5–15.5)
WBC: 10 K/uL (ref 4.0–10.5)
nRBC: 0.2 % (ref 0.0–0.2)

## 2024-08-03 LAB — GLUCOSE, CAPILLARY
Glucose-Capillary: 87 mg/dL (ref 70–99)
Glucose-Capillary: 225 mg/dL — ABNORMAL HIGH (ref 70–99)
Glucose-Capillary: 143 mg/dL — ABNORMAL HIGH (ref 70–99)

## 2024-08-03 LAB — HEPATITIS B CORE ANTIBODY, TOTAL: HEP B CORE AB: NEGATIVE

## 2024-08-03 MED ORDER — HEPARIN SODIUM (PORCINE) 1000 UNIT/ML IJ SOLN
INTRAMUSCULAR | Status: AC
  Filled 2024-08-03: qty 4

## 2024-08-03 MED ORDER — EPOETIN ALFA-EPBX 4000 UNIT/ML IJ SOLN
INTRAMUSCULAR | Status: AC
  Filled 2024-08-03: qty 1

## 2024-08-03 MED ORDER — EPOETIN ALFA-EPBX 4000 UNIT/ML IJ SOLN
4000.0000 [IU] | Freq: Once | INTRAMUSCULAR | Status: AC
  Administered 2024-08-03: 4000 [IU] via INTRAVENOUS

## 2024-08-03 NOTE — Evaluation (Signed)
 Occupational Therapy Evaluation Patient Details Name: Eric Browning MRN: [id number] DOB: [birth year] Today's Date: 08/03/2024   History of Present Illness   Eric Browning is a 73 year old male with past medical history of hypertension, gout, heart failure reduced ejection fraction, non-insulin -dependent diabetes mellitus type 2, severe COPD, pulmonary hypertension, hyperlipidemia, BPH, presented to hospital with shortness of breath and was noted to be hypoxic on room air at 88% as per EMS. He was initially on BiPAP as well.  Chest x-ray showed pulmonary vascular congestion. Labs were notable for creatinine of 2.9 with sodium of 147. Initially patient was given IV Lasix  40 mg IV Solu-Medrol , DuoNebs and was admitted to hospital for acute on chronic hypoxic respiratory failure secondary to heart failure decompensation and was started on IV diuresis. Subsequently, had some worsening of the renal function, Lasix  discontinued, nephrology consulted, temporary femoral catheter placed, and pt scheduled for 3 rounds of HD.     Clinical Impressions Eric Browning presents with generalized weakness, limited endurance, SOB. PTA, pt lives alone, is IND in B/IADLs. Due to recent placement of a temporary femoral HD catheter, communicated with MD to ensure pt was cleared for participation in rehab services. Evaluation conducted jointly by OT and PT to limit pt's OOB mobility. Catheter inspected at beginning and end of rehab session, with no concerns identified. Pt received in room shortly after returning from HD, and he endorses significant fatigue. Pt attempts to perform supine<>sit transfer, requiring Min A +2. Pt ultimately is too tired, however, to move fully to EOB sitting. He demonstrates R lateral lean throughout transfer attempt. Pt requests to return to supine position. He will benefit from ongoing OT while hospitalized. Given pt's limited ability to mobilize this date, recommend DC to rehab facility for services <3  hrs/day. Anticipate, however, that pt may well be able to move more [facility] when he is not coming directly from HD tx. In that case, DC w/ HH may be warranted.     If plan is discharge home, recommend the following:   A little help with walking and/or transfers;A little help with bathing/dressing/bathroom     Functional Status Assessment   Patient has had a recent decline in their functional status and demonstrates the ability to make significant improvements in function in a reasonable and predictable amount of time.     Equipment Recommendations   None recommended by OT     Recommendations for Other Services         Precautions/Restrictions   Precautions Precaution/Restrictions Comments: Pt has temporary femoral catheter. Mod falls risk. Restrictions Weight Bearing Restrictions Per Provider Order: No     Mobility Bed Mobility Overal bed mobility: Needs Assistance Bed Mobility: Supine to Sit, Sit to Supine     Supine to sit: Min assist, +2 for safety/equipment Sit to supine: Min assist   General bed mobility comments: Min A for managing LE    Transfers Overall transfer level: Needs assistance   Transfers: Sit to/from Stand             General transfer comment: Pt with initial goal of standing, but becomes fatigued with supine<sit transfer, takes a rest break, and then requests to return to supine rather than attempting to stand      Balance Overall balance assessment: Needs assistance Sitting-balance support: Bilateral upper extremity supported Sitting balance-Leahy Scale: Fair Sitting balance - Comments: Pt cannot achieve full upright seated posture on EOB, states he is too tired to move completely to EOB Postural control: Right lateral  lean     Standing balance comment: NT this date                           ADL either performed or assessed with clinical judgement   ADL                                                Vision         Perception         Praxis         Pertinent Vitals/Pain Pain Assessment Pain Assessment: No/denies pain     Extremity/Trunk Assessment Upper Extremity Assessment Upper Extremity Assessment: Defer to OT evaluation RUE Deficits / Details: limited mobility at R shoulder   Lower Extremity Assessment Lower Extremity Assessment: Generalized weakness       Communication Communication Communication: No apparent difficulties   Cognition Arousal: Alert Behavior During Therapy: WFL for tasks assessed/performed Cognition: No apparent impairments                                       Cueing  General Comments      SpO2 96% on 2 L [name]   Exercises Other Exercises Other Exercises: Educ re: PoC, DC recs   Shoulder Instructions      Home Living Family/patient expects to be discharged to:: Private residence Living Arrangements: Alone Available Help at Discharge: Family Type of Home: House Home Access: Stairs to enter Secretary/administrator of Steps: 4 Entrance Stairs-Rails: Right;Left;Can reach both Home Layout: One level     Bathroom Shower/Tub: Producer, Television/film/video: Handicapped height Bathroom Accessibility: Yes   Home Equipment: Agricultural Consultant (2 wheels);Cane - single point   Additional Comments: Pt has RW and cane but reports ambulating w/o AD      Prior Functioning/Environment Prior Level of Function : Independent/Modified Independent             Mobility Comments: Ambulated w/o AD. Reports 1 recent fall. ADLs Comments: IND    OT Problem List: Decreased strength;Decreased activity tolerance;Impaired balance (sitting and/or standing);Cardiopulmonary status limiting activity   OT Treatment/Interventions: Self-care/ADL training;Patient/family education;Therapeutic exercise;Balance training;Energy conservation;Therapeutic activities;DME and/or AE instruction      OT Goals(Current goals can be found in  the care plan section)   Acute Rehab OT Goals Patient Stated Goal: to feel better OT Goal Formulation: With patient Time For Goal Achievement: 08/17/24 Potential to Achieve Goals: Good ADL Goals Pt Will Perform Grooming: with modified independence;standing Pt Will Perform Upper Body Dressing: with modified independence;sitting;standing Pt Will Transfer to Toilet: with modified independence;ambulating;regular height toilet   OT Frequency:  Min 2X/week    Co-evaluation   Reason for Co-Treatment: Complexity of the patient's impairments (multi-system involvement);For patient/therapist safety;To address functional/ADL transfers PT goals addressed during session: Mobility/safety with mobility OT goals addressed during session: ADL's and self-care      AM-PAC OT 6 Clicks Daily Activity     Outcome Measure Help from another person eating meals?: None Help from another person taking care of personal grooming?: A Little Help from another person toileting, which includes using toliet, bedpan, or urinal?: A Lot Help from another person bathing (including washing, rinsing, drying)?: A Lot Help from another person to  put on and taking off regular upper body clothing?: A Little Help from another person to put on and taking off regular lower body clothing?: A Lot 6 Click Score: 16   End of Session    Activity Tolerance: Patient tolerated treatment well;Patient limited by fatigue Patient left: in bed;with family/visitor present;with call bell/phone within reach;with bed alarm set  OT Visit Diagnosis: Unsteadiness on feet (R26.81);Muscle weakness (generalized) (M62.81)                Time: 1340-1410 OT Time Calculation (min): 30 min Charges:  OT General Charges $OT Visit: 1 Visit OT Evaluation $OT Eval Moderate Complexity: 1 Mod OT Treatments $Self Care/Home Management : 8-22 mins Suzen Hock, PhD, MS, OTR/L 08/03/24, 4:19 PM

## 2024-08-03 NOTE — Progress Notes (Signed)
 PT Cancellation Note  Patient Details Name: Eric Browning MRN: [id number] DOB: 21-Jan-1951    Cancelled Treatment:    Reason Eval/Treat Not Completed: Patient at procedure or test/unavailable Pt. @ [facility]DIALYSIS UNIT    [name] 08/03/2024, 10:46 AM

## 2024-08-03 NOTE — Progress Notes (Signed)
 [facility]  - [place] [facility]   PATIENT NAME: Eric Browning    MR#:  [id number]  DATE OF BIRTH:  05/06/1951  SUBJECTIVE:  Seen at HD--started HD due to rising creat No family at bedside. Patient denies any complaints.making good uop  VITALS:  Blood pressure 126/69, pulse (!) 55, temperature 98 F (36.7 C), temperature source Oral, resp. rate (!) 31, height 5' 7 (1.702 m), weight 91.4 kg, SpO2 98%.  PHYSICAL EXAMINATION:   GENERAL:  73 y.o.-year-old patient with no acute distress.  LUNGS: Normal breath sounds bilaterally, no wheezing CARDIOVASCULAR: S1, S2 normal. No murmur   ABDOMEN: Soft, nontender, nondistended. Bowel sounds present.  EXTREMITIES: No  edema b/l.   Temp groin cath+ NEUROLOGIC: nonfocal  patient is alert and awake   LABORATORY PANEL:  CBC Recent Labs  Lab 08/03/24 0853  WBC 10.0  HGB 9.2*  HCT 29.5*  PLT 214    Chemistries  Recent Labs  Lab 07/29/24 0800 07/30/24 0437 08/01/24 0410 08/02/24 0908 08/03/24 0853  NA 147*   < > 136   < > 137  K 4.1   < > 4.6   < > 4.6  CL 109   < > 99   < > 100  CO2 28   < > 24   < > 25  GLUCOSE 130*   < > 164*   < > 99  BUN 36*   < > 96*   < > 107*  CREATININE 2.90*   < > 4.96*   < > 5.35*  CALCIUM  9.4   < > 7.9*   < > 7.5*  MG  --   --  2.8*  --   --   AST 11*  --   --   --   --   ALT 7  --   --   --   --   ALKPHOS 98  --   --   --   --   BILITOT 0.3  --   --   --   --    < > = values in this interval not displayed.      Assessment and Plan   Eric Browning is a 73 year old male with past medical history of hypertension, gout, heart failure reduced ejection fraction, non-insulin -dependent diabetes mellitus type 2, severe COPD, pulmonary hypertension, hyperlipidemia, BPH, presented to hospital with shortness of breath and was noted to be hypoxic on room air at 88% as per EMS.  He was initially on BiPAP as well.  Chest x-ray showed pulmonary vascular congestion.  Labs were notable for  creatinine of 2.9 with sodium of 147.  Initially patient was given IV Lasix  40 mg IV Solu-Medrol , DuoNebs and was admitted to hospital for acute on chronic hypoxic respiratory failure secondary to heart failure decompensation and was started on IV diuresis.  Subsequently, had some worsening of the renal function, Lasix  was discontinued and nephrology was consulted.   Acute on chronic congestive heart failure with reduced ejection fraction. Improved symptoms.  Continue strict intake and output charting.  Lasix  currently on hold due to AKI.  Continue Coreg .   Currently on room air   COPD with acute exacerbation Improved.  Continue albuterol  nebulizer, will change IV Solu-Medrol  to prednisone .  Continue incentive spirometry and supportive care.  Mild leukocytosis but patient is on steroids.   Acute kidney injury on CKD stage IV Rising creatinine--started on HD Possibly secondary to overdiuresis.  Was initially on 60 mg  of Lasix  IV twice daily.  Nephrology on board.  Renal ultrasound without any acute abnormality.  -- Creatinine today at 5.5 rom 4.2 <3.5. --  Initial creatinine was 2.9.  Patient was started on gentle IV fluids since yesterday.   --  Continue to hold Farxiga. -- Nephrology recommend starting dialysis--vascular consulted for temporary cath placement --HD started on 12/5   Essential hypertension --Patient is on amlodipine , carvedilol   hydralazine  , Imdur      Type 2 diabetes mellitus without complication, without long-term current use of insulin  (HCC) Continue sliding scale insulin .  Hold metformin  for now.  Glycemic control is adequate at this time   Pure hypercholesterolemia Continue ezetimibe ,pravastatin     Personal history of gout Hold allopurinol  100 mg daily due to AKI.    Procedures: temporary catheter for dialysis placement Family communication : none by me. Nephrology spoke with son Consults : vascular, nephrology CODE STATUS: full DVT Prophylaxis : heparin  Level  of care: Progressive Status is: Inpatient Remains inpatient appropriate because: new dialysis    TOTAL TIME TAKING CARE OF THIS PATIENT: 40 minutes.  >50% time spent on counselling and coordination of care  Note: This dictation was prepared with Dragon dictation along with smaller phrase technology. Any transcriptional errors that result from this process are unintentional.  [name] M.D    [facility]   CC: Primary care physician; Alla Amis, MD

## 2024-08-03 NOTE — Progress Notes (Signed)
 Hemodialysis Note:  Received patient in bed to unit. Alert and oriented. Informed consent singed and in chart.  Treatment initiated: 0849 Treatment completed: 1119  Access used: Right Femoral catheter Access issues: None  Patient tolerated well. Transported back to room, alert without acute distress. Report given to patient's RN.  Total UF removed: 400 ml Medications given: Retacrit  4000 units IV  Post HD weight: 81.4 mg  [facility]

## 2024-08-03 NOTE — Evaluation (Signed)
 Physical Therapy Evaluation Patient Details Name: Eric Browning MRN: [id number] DOB: November 15, 1950 Today's Date: 08/03/2024  History of Present Illness  Pt is a 73 y.o. adult with PMH of CHF, CAD status post PCI and stenting, chronic HFrEF, severe pulmonary hypertension, COPD Gold stage IV, CKD stage IV, OSA. MD assessment i.ncludes: acute on chronic congestive heart failure with reduced ejection fraction, s/p temp hemodialysis femoral cath R LE    Clinical Impression    Pt was lying in bed on arrival, was pleasant and motivated to participate during the session and put forth good effort throughout. Pt has a temporary R femoral cath, MD notified and pt cleared for PT services. Temporary fem cath remained intact, no bleeding noted; was monitored before, during and after activity. Pt was able to mobilize to EOB with max cueing, encouragement, increased time for LE placement and required min A +2 for safety. Pt was educated on scooting EOB using momentum and LE placement, but was unable to sequence LE due to fatigue -- and did not achieve sitting upright at EOB. Pt initially was prepared to stand at bedside, but was limited by fatigue and needed to return to supine with min A for LE sequencing. Pt will benefit from continued PT services upon discharge to safely address deficits listed in patient problem list for decreased caregiver assistance and eventual return to PLOF.     If plan is discharge home, recommend the following: A lot of help with walking and/or transfers;A lot of help with bathing/dressing/bathroom;Assistance with cooking/housework;Help with stairs or ramp for entrance;Assist for transportation   Can travel by private vehicle   No    Equipment Recommendations    Recommendations for Other Services       Functional Status Assessment Patient has had a recent decline in their functional status and demonstrates the ability to make significant improvements in function in a reasonable and  predictable amount of time.     Precautions / Restrictions Precautions Precautions: Fall Recall of Precautions/Restrictions: Intact Precaution/Restrictions Comments: Pt has temporary femoral catheter. Mod falls risk. Restrictions Weight Bearing Restrictions Per Provider Order: No      Mobility  Bed Mobility Overal bed mobility: Needs Assistance Bed Mobility: Supine to Sit, Sit to Supine     Supine to sit: Min assist, +2 for safety/equipment Sit to supine: Min assist   General bed mobility comments: Min A for managing LE    Transfers Overall transfer level: Needs assistance   Transfers: Sit to/from Stand             General transfer comment: Pt was not able to tolerate standing during today's session, became fatigued with sitting EOB transition, requeste to return to supine rather than attempt standing    Ambulation/Gait                  Stairs            Wheelchair Mobility     Tilt Bed    Modified Rankin (Stroke Patients Only)       Balance Overall balance assessment: Needs assistance Sitting-balance support: Bilateral upper extremity supported Sitting balance-Leahy Scale: Fair Sitting balance - Comments: Pt reported feeling too tired to scoot to EOB to sit fully upright Postural control: Right lateral lean   Standing balance-Leahy Scale:  Standing balance comment: NT                             Pertinent Vitals/Pain Pain  Assessment Pain Assessment: No/denies pain    Home Living Family/patient expects to be discharged to:: Private residence Living Arrangements: Alone Available Help at Discharge: Family Type of Home: House Home Access: Stairs to enter Entrance Stairs-Rails: Right;Left;Can reach both Entrance Stairs-Number of Steps: 4   Home Layout: One level Home Equipment: Agricultural Consultant (2 wheels);Cane - single point Additional Comments: Pt has RW and cane but reports ambulating w/o AD    Prior Function Prior  Level of Function : Independent/Modified Independent             Mobility Comments: Ambulated w/o AD. Reports 1 recent fall. ADLs Comments: IND     Extremity/Trunk Assessment   Upper Extremity Assessment Upper Extremity Assessment: Defer to OT evaluation RUE Deficits / Details: limited mobility at R shoulder    Lower Extremity Assessment Lower Extremity Assessment: Generalized weakness       Communication   Communication Communication: No apparent difficulties    Cognition Arousal: Alert Behavior During Therapy: WFL for tasks assessed/performed                             Following commands: Intact       Cueing Cueing Techniques: Verbal cues, Tactile cues     General Comments General comments (skin integrity, edema, etc.): SpO2 96% on 2 L [place] throughout session     Exercises Other Exercises Other Exercises: edu on dc recommnedations, scooting    Assessment/Plan    PT Assessment Patient needs continued PT services  PT Problem List Decreased strength;Decreased range of motion;Decreased activity tolerance;Decreased balance;Decreased mobility;Decreased coordination;Decreased safety awareness;Decreased knowledge of precautions       PT Treatment Interventions DME instruction;Gait training;Stair training;Functional mobility training;Therapeutic activities;Therapeutic exercise;Balance training;Patient/family education    PT Goals (Current goals can be found in the Care Plan section)  Acute Rehab PT Goals Patient Stated Goal: get back home PT Goal Formulation: With patient Time For Goal Achievement: 08/16/24 Potential to Achieve Goals: Fair    Frequency Min 2X/week     Co-evaluation PT/OT/SLP Co-Evaluation/Treatment: Yes Reason for Co-Treatment: Complexity of the patient's impairments (multi-system involvement);For patient/therapist safety;To address functional/ADL transfers PT goals addressed during session: Mobility/safety with mobility OT  goals addressed during session: ADL's and self-care       AM-PAC PT 6 Clicks Mobility  Outcome Measure Help needed turning from your back to your side while in a flat bed without using bedrails?: A Little Help needed moving from lying on your back to sitting on the side of a flat bed without using bedrails?: A Little Help needed moving to and from a bed to a chair (including a wheelchair)?: A Lot Help needed standing up from a chair using your arms (e.g., wheelchair or bedside chair)?: Total Help needed to walk in hospital room?: Total Help needed climbing 3-5 steps with a railing? : Total 6 Click Score: 11    End of Session Equipment Utilized During Treatment: Gait belt Activity Tolerance: Patient limited by fatigue Patient left: in bed;with call bell/phone within reach;with bed alarm set Nurse Communication: Mobility status PT Visit Diagnosis: Unsteadiness on feet (R26.81);Muscle weakness (generalized) (M62.81);Difficulty in walking, not elsewhere classified (R26.2)    Time: 8657-8641 PT Time Calculation (min) (ACUTE ONLY): 16 min   Charges:                Corean Newport, SPT 08/03/24, 4:29 PM

## 2024-08-03 NOTE — Progress Notes (Signed)
 [facility]  ROUNDING NOTE   Subjective:   Eric Browning is a 73 y.o. male with past medical history of diabetes, peripheral vascular disease, hypertension, congestive heart failure, hyperlipidemia, gout, BPH and chronic kidney disease with anemia and proteinuria. Patient presents to ED with shortness of breath and has been admitted for CHF (congestive heart failure) (HCC) [I50.9] SOB (shortness of breath) [R06.02] Hypoxia [R09.02] Acute on chronic systolic congestive heart failure (HCC) [I50.23] Acute respiratory failure with hypoxia and hypercarbia (HCC) [J96.01, J96.02]  Patient is known to our practice and was seen by Dr Dennise in October.   Update Patient seen and evaluated during dialysis   HEMODIALYSIS FLOWSHEET:  Blood Flow Rate (mL/min): 250 mL/min Arterial Pressure (mmHg): -196.76 mmHg Venous Pressure (mmHg): 88.88 mmHg TMP (mmHg): 13.33 mmHg Ultrafiltration Rate (mL/min): 480 mL/min Dialysate Flow Rate (mL/min): 300 ml/min  Receiving second treatment today Tolerating well.    Objective:  Vital signs in last 24 hours:  Temp:  [97.6 F (36.4 C)-98.1 F (36.7 C)] 97.8 F (36.6 C) (12/06 0833) Pulse Rate:  [53-89] 76 (12/06 1100) Resp:  [14-21] 18 (12/06 1100) BP: (104-137)/(46-90) 126/64 (12/06 1100) SpO2:  [94 %-100 %] 98 % (12/06 1100) Weight:  [90.3 kg-94.9 kg] 91.8 kg (12/06 0833)  Weight change: 8.7 kg Filed Weights   08/02/24 1730 08/03/24 0500 08/03/24 0833  Weight: 90.3 kg 94.9 kg 91.8 kg    Intake/Output: I/O last 3 completed shifts: In: 2396.6 [P.O.:800; I.V.:1596.6] Out: 1050 [Urine:1050]   Intake/Output this shift:  No intake/output data recorded.  Physical Exam: General: NAD  Head: Normocephalic, atraumatic. Moist oral mucosal membranes  Eyes: Anicteric  Lungs:  Clear to auscultation, normal effort  Heart: Regular rate and rhythm  Abdomen:  Soft, nontender  Extremities:  Trace peripheral edema.  Neurologic: Awake, alert,  conversant  Skin: Warm,dry, no rash  Access Rt femoral tempcath    Basic Metabolic Panel: Recent Labs  Lab 07/30/24 0437 07/31/24 0406 08/01/24 0410 08/02/24 0908 08/03/24 0853  NA 145 141 136 137 137  K 4.1 4.4 4.6 4.7 4.6  CL 106 103 99 101 100  CO2 28 26 24  21* 25  GLUCOSE 133* 141* 164* 122* 99  BUN 51* 74* 96* 118* 107*  CREATININE 3.54* 4.26* 4.96* 5.55* 5.35*  CALCIUM  8.7* 8.3* 7.9* 7.8* 7.5*  MG  --   --  2.8*  --   --   PHOS  --   --   --  7.6* 7.7*    Liver Function Tests: Recent Labs  Lab 07/29/24 0800 08/02/24 0908 08/03/24 0853  AST 11*  --   --   ALT 7  --   --   ALKPHOS 98  --   --   BILITOT 0.3  --   --   PROT 7.1  --   --   ALBUMIN 4.2 3.7 3.3*   No results for input(s): LIPASE, AMYLASE in the last 168 hours. No results for input(s): AMMONIA in the last 168 hours.  CBC: Recent Labs  Lab 07/29/24 0800 07/31/24 0406 08/01/24 0410 08/02/24 1531 08/03/24 0853  WBC 7.1 13.8* 12.0* 9.3 10.0  HGB 10.5* 9.2* 9.2* 9.0* 9.2*  HCT 33.7* 29.3* 29.9* 28.7* 29.5*  MCV 85.3 85.2 84.9 84.4 85.5  PLT 343 311 280 234 214    Cardiac Enzymes: No results for input(s): CKTOTAL, CKMB, CKMBINDEX, TROPONINI in the last 168 hours.  BNP: Invalid input(s): POCBNP  CBG: Recent Labs  Lab 08/01/24 1800 08/01/24 2121  08/02/24 0804 08/02/24 1222 08/02/24 2035  GLUCAP 180* 147* 124* 94 174*    Microbiology: Results for orders placed or performed during the hospital encounter of 07/29/24  Resp panel by RT-PCR (RSV, Flu A&B, Covid) Anterior Nasal Swab     Status: None   Collection Time: 07/29/24  7:57 AM   Specimen: Anterior Nasal Swab  Result Value Ref Range Status   SARS Coronavirus 2 by RT PCR NEGATIVE NEGATIVE Final    Comment: (NOTE) SARS-CoV-2 target nucleic acids are NOT DETECTED.  The SARS-CoV-2 RNA is generally detectable in upper respiratory specimens during the acute phase of infection. The lowest concentration of SARS-CoV-2  viral copies this assay can detect is 138 copies/mL. A negative result does not preclude SARS-Cov-2 infection and should not be used as the sole basis for treatment or other patient management decisions. A negative result may occur with  improper specimen collection/handling, submission of specimen other than nasopharyngeal swab, presence of viral mutation(s) within the areas targeted by this assay, and inadequate number of viral copies(<138 copies/mL). A negative result must be combined with clinical observations, patient history, and epidemiological information. The expected result is Negative.  Fact Sheet for Patients:  bloggercourse.com  Fact Sheet for Healthcare Providers:  seriousbroker.it  This test is no t yet approved or cleared by the [place]  FDA and  has been authorized for detection and/or diagnosis of SARS-CoV-2 by FDA under an Emergency Use Authorization (EUA). This EUA will remain  in effect (meaning this test can be used) for the duration of the COVID-19 declaration under Section 564(b)(1) of the Act, 21 U.S.C.section 360bbb-3(b)(1), unless the authorization is terminated  or revoked sooner.       Influenza A by PCR NEGATIVE NEGATIVE Final   Influenza B by PCR NEGATIVE NEGATIVE Final    Comment: (NOTE) The Xpert Xpress SARS-CoV-2/FLU/RSV plus assay is intended as an aid in the diagnosis of influenza from Nasopharyngeal swab specimens and should not be used as a sole basis for treatment. Nasal washings and aspirates are unacceptable for Xpert Xpress SARS-CoV-2/FLU/RSV testing.  Fact Sheet for Patients: bloggercourse.com  Fact Sheet for Healthcare Providers: seriousbroker.it  This test is not yet approved or cleared by the [place]  FDA and has been authorized for detection and/or diagnosis of SARS-CoV-2 by FDA under an Emergency Use Authorization  (EUA). This EUA will remain in effect (meaning this test can be used) for the duration of the COVID-19 declaration under Section 564(b)(1) of the Act, 21 U.S.C. section 360bbb-3(b)(1), unless the authorization is terminated or revoked.     Resp Syncytial Virus by PCR NEGATIVE NEGATIVE Final    Comment: (NOTE) Fact Sheet for Patients: bloggercourse.com  Fact Sheet for Healthcare Providers: seriousbroker.it  This test is not yet approved or cleared by the [place]  FDA and has been authorized for detection and/or diagnosis of SARS-CoV-2 by FDA under an Emergency Use Authorization (EUA). This EUA will remain in effect (meaning this test can be used) for the duration of the COVID-19 declaration under Section 564(b)(1) of the Act, 21 U.S.C. section 360bbb-3(b)(1), unless the authorization is terminated or revoked.  Performed at [facility], [address] [place], [place] [address]     Coagulation Studies: No results for input(s): LABPROT, INR in the last 72 hours.   Urinalysis: No results for input(s): COLORURINE, LABSPEC, PHURINE, GLUCOSEU, HGBUR, BILIRUBINUR, KETONESUR, PROTEINUR, UROBILINOGEN, NITRITE, LEUKOCYTESUR in the last 72 hours.  Invalid input(s): APPERANCEUR    Imaging: PERIPHERAL VASCULAR CATHETERIZATION Result  Date: 08/02/2024 See surgical note for result.    Medications:      amLODipine   5 mg Oral Daily   aspirin  EC  81 mg Oral Daily   carvedilol   12.5 mg Oral BID   Chlorhexidine  Gluconate Cloth  6 each Topical Q0600   ezetimibe   10 mg Oral Daily   fluticasone  furoate-vilanterol  1 puff Inhalation Daily   guaiFENesin   1,200 mg Oral BID   heparin   5,000 Units Subcutaneous Q12H   hydrALAZINE   25 mg Oral Q8H   insulin  aspart  0-9 Units Subcutaneous TID WC   ipratropium-albuterol   3 mL Nebulization BID   isosorbide  mononitrate  30 mg Oral Daily   pravastatin    20 mg Oral QHS   predniSONE   40 mg Oral Q breakfast   tamsulosin   0.4 mg Oral Daily   acetaminophen , albuterol , hydrALAZINE , ondansetron  (ZOFRAN ) IV  Assessment/ Plan:  Eric Browning is a 73 y.o.  adult with past medical history of diabetes, peripheral vascular disease, hypertension, congestive heart failure, hyperlipidemia, gout, BPH and chronic kidney disease with anemia and proteinuria. Patient presents to ED with shortness of breath and has been admitted for CHF (congestive heart failure) (HCC) [I50.9] SOB (shortness of breath) [R06.02] Hypoxia [R09.02] Acute on chronic systolic congestive heart failure (HCC) [I50.23] Acute respiratory failure with hypoxia and hypercarbia (HCC) [J96.01, J96.02]   Acute Kidney Injury on chronic kidney disease stage 4 with baseline creatinine 3.0 and GFR of 21 on 07/03/2024.  Acute kidney injury secondary to volume overload with IV diuresis. Creatinine baseline on Ed arrival, now 4.2. Was given IV furosemide  for diuresis. Now held.   Due to persistent renal decline, patient agreeable to proceed with dialysis. Appreciate vascular surgery placing HD temp cath. Patient received initial dialysis treatment yesterday, with second treatment today. Will plan for third treatment on Monday.    Lab Results  Component Value Date   CREATININE 5.35 (H) 08/03/2024   CREATININE 5.55 (H) 08/02/2024   CREATININE 4.96 (H) 08/01/2024    Intake/Output Summary (Last 24 hours) at 08/03/2024 1110 Last data filed at 08/03/2024 0100 Gross per 24 hour  Intake 240 ml  Output 300 ml  Net -60 ml   2. Chronic systolic heart failure , ECHO completed on 06/07/24 shows EF 40%. Prescribed Torsemide  40mg  twice daily, then decreased to daily, per patient.  Diuretics held. Volume status stable  3. Anemia of chronic kidney disease  Lab Results  Component Value Date   HGB 9.2 (L) 08/03/2024    Hgb borderline. Will order low dose Retacrit  today  4. Hypertension with chronic  kidney disease. Receiving amlodipine , carvedilol , hydralazine , and isosorbide . Blood pressure stable during dialysis   LOS: 5 Warner Laduca 12/6/202511:10 AM

## 2024-08-03 NOTE — Progress Notes (Signed)
 OT Cancellation Note  Patient Details Name: Eric Browning MRN: [id number] DOB: 1950-12-17   Cancelled Treatment:    Reason Eval/Treat Not Completed: Patient at procedure or test/ unavailable. [name] is currently off the floor for hemodialysis. Will attempt OT evaluation at a later time/date, as pt is available.  Suzen Hock 08/03/2024, 8:57 AM

## 2024-08-03 NOTE — Plan of Care (Signed)

## 2024-08-04 LAB — GLUCOSE, CAPILLARY
Glucose-Capillary: 100 mg/dL — ABNORMAL HIGH (ref 70–99)
Glucose-Capillary: 116 mg/dL — ABNORMAL HIGH (ref 70–99)
Glucose-Capillary: 139 mg/dL — ABNORMAL HIGH (ref 70–99)
Glucose-Capillary: 159 mg/dL — ABNORMAL HIGH (ref 70–99)

## 2024-08-04 NOTE — Plan of Care (Signed)

## 2024-08-04 NOTE — Progress Notes (Signed)
 PROGRESS NOTE    Eric Browning  FMW:[id number] DOB: [birth year] DOA: 07/29/2024 PCP: Alla Amis, MD   Assessment & Plan:   Principal Problem:   CHF (congestive heart failure) (HCC) Active Problems:   AKI (acute kidney injury)   COPD with acute exacerbation (HCC)   Essential hypertension   Symptomatic anemia   Primary male hypogonadism   Pure hypercholesterolemia   Type 2 diabetes mellitus without complication, without long-term current use of insulin  (HCC)   Personal history of gout   Encounter for dialysis and dialysis catheter care   ESRD (end stage renal disease) (HCC)  Assessment and Plan: Acute on chronic systolic CHF exacerbation: holding lasix  as pt is now on HD. Continue on coreg . Monitor I/Os    COPD exacerbation: continue on steroids, bronchodilators & encourage incentive spirometry    AKI on CKDIV: started pm HD on 08/02/24 as per nephro. HD as per nephro. Nephro following and recs apprec    HTN: coreg , imdur , hydralazine , amlodipine      DM2: likely poorly controlled. Continue on SSI w/ accuchecks    HLD: continue on statin, zetia   Hx of gout: holding home allopurinol  secondary to AKI on CKDIV currently requiring HD    Obesity: BMI 32.5. Would benefit from weight loss    DVT prophylaxis: heparin   Code Status: full  Family Communication: discussed pt's care w/ pt's family at bedside and answered their questions Disposition Plan: likely d/c to SNF  Status is: Inpatient Remains inpatient appropriate because: started HD this admission, severity of illness    Level of care: Progressive Consultants:  Nephro   Procedures:   Antimicrobials:   Subjective: Pt c/o malaise  Objective: Vitals:   08/04/24 0041 08/04/24 0445 08/04/24 0456 08/04/24 0821  BP: (!) 117/49 (!) 137/52  (!) 127/56  Pulse: (!) 49 71  75  Resp: 20 20  20   Temp: 98.5 F (36.9 C) 97.7 F (36.5 C)  (!) 97.5 F (36.4 C)  TempSrc:      SpO2: 97% 93%  100%  Weight:   94.2 kg    Height:        Intake/Output Summary (Last 24 hours) at 08/04/2024 0955 Last data filed at 08/04/2024 0057 Gross per 24 hour  Intake 720 ml  Output 600 ml  Net 120 ml   Filed Weights   08/03/24 0833 08/03/24 1130 08/04/24 0456  Weight: 91.8 kg 91.4 kg 94.2 kg    Examination:  General exam: Appears calm and comfortable  Respiratory system: Clear to auscultation. Respiratory effort normal. Cardiovascular system: S1 & S2+. No rubs, gallops or clicks.  Gastrointestinal system: Abdomen is obes, soft and nontender.  Normal bowel sounds heard. Central nervous system: Alert and oriented. Moves all extremities Psychiatry: Judgement and insight appear normal. Mood & affect appropriate.     Data Reviewed: I have personally reviewed following labs and imaging studies  CBC: Recent Labs  Lab 07/29/24 0800 07/31/24 0406 08/01/24 0410 08/02/24 1531 08/03/24 0853  WBC 7.1 13.8* 12.0* 9.3 10.0  HGB 10.5* 9.2* 9.2* 9.0* 9.2*  HCT 33.7* 29.3* 29.9* 28.7* 29.5*  MCV 85.3 85.2 84.9 84.4 85.5  PLT 343 311 280 234 214   Basic Metabolic Panel: Recent Labs  Lab 07/30/24 0437 07/31/24 0406 08/01/24 0410 08/02/24 0908 08/03/24 0853  NA 145 141 136 137 137  K 4.1 4.4 4.6 4.7 4.6  CL 106 103 99 101 100  CO2 28 26 24  21* 25  GLUCOSE 133* 141* 164* 122* 99  BUN  51* 74* 96* 118* 107*  CREATININE 3.54* 4.26* 4.96* 5.55* 5.35*  CALCIUM  8.7* 8.3* 7.9* 7.8* 7.5*  MG  --   --  2.8*  --   --   PHOS  --   --   --  7.6* 7.7*   GFR: Estimated Creatinine Clearance (by C-G formula based on SCr of 5.35 mg/dL (H)) Male: 89.2 mL/min (A) Male: 13 mL/min (A) Liver Function Tests: Recent Labs  Lab 07/29/24 0800 08/02/24 0908 08/03/24 0853  AST 11*  --   --   ALT 7  --   --   ALKPHOS 98  --   --   BILITOT 0.3  --   --   PROT 7.1  --   --   ALBUMIN 4.2 3.7 3.3*   No results for input(s): LIPASE, AMYLASE in the last 168 hours. No results for input(s): AMMONIA in the last 168  hours. Coagulation Profile: Recent Labs  Lab 07/29/24 0800  INR 1.1   Cardiac Enzymes: No results for input(s): CKTOTAL, CKMB, CKMBINDEX, TROPONINI in the last 168 hours. BNP (last 3 results) Recent Labs    07/29/24 0800  PROBNP 7,322.0*   HbA1C: No results for input(s): HGBA1C in the last 72 hours. CBG: Recent Labs  Lab 08/02/24 2035 08/03/24 1222 08/03/24 1617 08/03/24 2054 08/04/24 0823  GLUCAP 174* 87 225* 143* 100*   Lipid Profile: No results for input(s): CHOL, HDL, LDLCALC, TRIG, CHOLHDL, LDLDIRECT in the last 72 hours. Thyroid Function Tests: No results for input(s): TSH, T4TOTAL, FREET4, T3FREE, THYROIDAB in the last 72 hours. Anemia Panel: No results for input(s): VITAMINB12, FOLATE, FERRITIN, TIBC, IRON, RETICCTPCT in the last 72 hours. Sepsis Labs: No results for input(s): PROCALCITON, LATICACIDVEN in the last 168 hours.  Recent Results (from the past 240 hours)  Resp panel by RT-PCR (RSV, Flu A&B, Covid) Anterior Nasal Swab     Status: None   Collection Time: 07/29/24  7:57 AM   Specimen: Anterior Nasal Swab  Result Value Ref Range Status   SARS Coronavirus 2 by RT PCR NEGATIVE NEGATIVE Final    Comment: (NOTE) SARS-CoV-2 target nucleic acids are NOT DETECTED.  The SARS-CoV-2 RNA is generally detectable in upper respiratory specimens during the acute phase of infection. The lowest concentration of SARS-CoV-2 viral copies this assay can detect is 138 copies/mL. A negative result does not preclude SARS-Cov-2 infection and should not be used as the sole basis for treatment or other patient management decisions. A negative result may occur with  improper specimen collection/handling, submission of specimen other than nasopharyngeal swab, presence of viral mutation(s) within the areas targeted by this assay, and inadequate number of viral copies(<138 copies/mL). A negative result must be combined  with clinical observations, patient history, and epidemiological information. The expected result is Negative.  Fact Sheet for Patients:  bloggercourse.com  Fact Sheet for Healthcare Providers:  seriousbroker.it  This test is no t yet approved or cleared by the [place]  FDA and  has been authorized for detection and/or diagnosis of SARS-CoV-2 by FDA under an Emergency Use Authorization (EUA). This EUA will remain  in effect (meaning this test can be used) for the duration of the COVID-19 declaration under Section 564(b)(1) of the Act, 21 U.S.C.section 360bbb-3(b)(1), unless the authorization is terminated  or revoked sooner.       Influenza A by PCR NEGATIVE NEGATIVE Final   Influenza B by PCR NEGATIVE NEGATIVE Final    Comment: (NOTE) The Xpert Xpress SARS-CoV-2/FLU/RSV plus assay  is intended as an aid in the diagnosis of influenza from Nasopharyngeal swab specimens and should not be used as a sole basis for treatment. Nasal washings and aspirates are unacceptable for Xpert Xpress SARS-CoV-2/FLU/RSV testing.  Fact Sheet for Patients: bloggercourse.com  Fact Sheet for Healthcare Providers: seriousbroker.it  This test is not yet approved or cleared by the [place]  FDA and has been authorized for detection and/or diagnosis of SARS-CoV-2 by FDA under an Emergency Use Authorization (EUA). This EUA will remain in effect (meaning this test can be used) for the duration of the COVID-19 declaration under Section 564(b)(1) of the Act, 21 U.S.C. section 360bbb-3(b)(1), unless the authorization is terminated or revoked.     Resp Syncytial Virus by PCR NEGATIVE NEGATIVE Final    Comment: (NOTE) Fact Sheet for Patients: bloggercourse.com  Fact Sheet for Healthcare Providers: seriousbroker.it  This test is not yet approved  or cleared by the [place]  FDA and has been authorized for detection and/or diagnosis of SARS-CoV-2 by FDA under an Emergency Use Authorization (EUA). This EUA will remain in effect (meaning this test can be used) for the duration of the COVID-19 declaration under Section 564(b)(1) of the Act, 21 U.S.C. section 360bbb-3(b)(1), unless the authorization is terminated or revoked.  Performed at [facility], [address], [place], [place] [address]          Radiology Studies: PERIPHERAL VASCULAR CATHETERIZATION Result Date: 08/02/2024 See surgical note for result.       Scheduled Meds:  amLODipine   5 mg Oral Daily   aspirin  EC  81 mg Oral Daily   carvedilol   12.5 mg Oral BID   Chlorhexidine  Gluconate Cloth  6 each Topical Q0600   ezetimibe   10 mg Oral Daily   fluticasone  furoate-vilanterol  1 puff Inhalation Daily   guaiFENesin   1,200 mg Oral BID   heparin   5,000 Units Subcutaneous Q12H   hydrALAZINE   25 mg Oral Q8H   insulin  aspart  0-9 Units Subcutaneous TID WC   ipratropium-albuterol   3 mL Nebulization BID   isosorbide  mononitrate  30 mg Oral Daily   pravastatin   20 mg Oral QHS   predniSONE   40 mg Oral Q breakfast   tamsulosin   0.4 mg Oral Daily   Continuous Infusions:   LOS: 6 days       Anthony CHRISTELLA Pouch, MD [facility] Pager [phone]  If 7PM-7AM, please contact night-coverage [email or web address] 08/04/2024, 9:55 AM

## 2024-08-04 NOTE — Progress Notes (Signed)
 [facility]  ROUNDING NOTE   Subjective:   Eric Browning is a 73 y.o. male with past medical history of diabetes, peripheral vascular disease, hypertension, congestive heart failure, hyperlipidemia, gout, BPH and chronic kidney disease with anemia and proteinuria. Patient presents to ED with shortness of breath and has been admitted for CHF (congestive heart failure) (HCC) [I50.9] SOB (shortness of breath) [R06.02] Hypoxia [R09.02] Acute on chronic systolic congestive heart failure (HCC) [I50.23] Acute respiratory failure with hypoxia and hypercarbia (HCC) [J96.01, J96.02]  Patient is known to our practice and was seen by Dr Dennise in October.   Update Patient resting comfortably in bed Remains on room Denies shortness of breath   Objective:  Vital signs in last 24 hours:  Temp:  [97.5 F (36.4 C)-98.5 F (36.9 C)] 97.9 F (36.6 C) (12/07 1148) Pulse Rate:  [49-83] 83 (12/07 1148) Resp:  [14-20] 18 (12/07 1148) BP: (97-137)/(49-69) 124/66 (12/07 1148) SpO2:  [93 %-100 %] 94 % (12/07 1148) FiO2 (%):  [98 %] 98 % (12/06 2003) Weight:  [94.2 kg] 94.2 kg (12/07 0456)  Weight change: 1.5 kg Filed Weights   08/03/24 0833 08/03/24 1130 08/04/24 0456  Weight: 91.8 kg 91.4 kg 94.2 kg    Intake/Output: I/O last 3 completed shifts: In: 960 [P.O.:960] Out: 900 [Urine:500; Other:400]   Intake/Output this shift:  No intake/output data recorded.  Physical Exam: General: NAD  Head: Normocephalic, atraumatic. Moist oral mucosal membranes  Eyes: Anicteric  Lungs:  Clear to auscultation, normal effort  Heart: Regular rate and rhythm  Abdomen:  Soft, nontender  Extremities:  Trace peripheral edema.  Neurologic: Awake, alert, conversant  Skin: Warm,dry, no rash  Access Rt femoral tempcath    Basic Metabolic Panel: Recent Labs  Lab 07/30/24 0437 07/31/24 0406 08/01/24 0410 08/02/24 0908 08/03/24 0853  NA 145 141 136 137 137  K 4.1 4.4 4.6 4.7 4.6  CL 106 103  99 101 100  CO2 28 26 24  21* 25  GLUCOSE 133* 141* 164* 122* 99  BUN 51* 74* 96* 118* 107*  CREATININE 3.54* 4.26* 4.96* 5.55* 5.35*  CALCIUM  8.7* 8.3* 7.9* 7.8* 7.5*  MG  --   --  2.8*  --   --   PHOS  --   --   --  7.6* 7.7*    Liver Function Tests: Recent Labs  Lab 07/29/24 0800 08/02/24 0908 08/03/24 0853  AST 11*  --   --   ALT 7  --   --   ALKPHOS 98  --   --   BILITOT 0.3  --   --   PROT 7.1  --   --   ALBUMIN 4.2 3.7 3.3*   No results for input(s): LIPASE, AMYLASE in the last 168 hours. No results for input(s): AMMONIA in the last 168 hours.  CBC: Recent Labs  Lab 07/29/24 0800 07/31/24 0406 08/01/24 0410 08/02/24 1531 08/03/24 0853  WBC 7.1 13.8* 12.0* 9.3 10.0  HGB 10.5* 9.2* 9.2* 9.0* 9.2*  HCT 33.7* 29.3* 29.9* 28.7* 29.5*  MCV 85.3 85.2 84.9 84.4 85.5  PLT 343 311 280 234 214    Cardiac Enzymes: No results for input(s): CKTOTAL, CKMB, CKMBINDEX, TROPONINI in the last 168 hours.  BNP: Invalid input(s): POCBNP  CBG: Recent Labs  Lab 08/03/24 1222 08/03/24 1617 08/03/24 2054 08/04/24 0823 08/04/24 1149  GLUCAP 87 225* 143* 100* 116*    Microbiology: Results for orders placed or performed during the hospital encounter of 07/29/24  Resp panel by RT-PCR (RSV, Flu A&B, Covid) Anterior Nasal Swab     Status: None   Collection Time: 07/29/24  7:57 AM   Specimen: Anterior Nasal Swab  Result Value Ref Range Status   SARS Coronavirus 2 by RT PCR NEGATIVE NEGATIVE Final    Comment: (NOTE) SARS-CoV-2 target nucleic acids are NOT DETECTED.  The SARS-CoV-2 RNA is generally detectable in upper respiratory specimens during the acute phase of infection. The lowest concentration of SARS-CoV-2 viral copies this assay can detect is 138 copies/mL. A negative result does not preclude SARS-Cov-2 infection and should not be used as the sole basis for treatment or other patient management decisions. A negative result may occur with  improper  specimen collection/handling, submission of specimen other than nasopharyngeal swab, presence of viral mutation(s) within the areas targeted by this assay, and inadequate number of viral copies(<138 copies/mL). A negative result must be combined with clinical observations, patient history, and epidemiological information. The expected result is Negative.  Fact Sheet for Patients:  bloggercourse.com  Fact Sheet for Healthcare Providers:  seriousbroker.it  This test is no t yet approved or cleared by the [place]  FDA and  has been authorized for detection and/or diagnosis of SARS-CoV-2 by FDA under an Emergency Use Authorization (EUA). This EUA will remain  in effect (meaning this test can be used) for the duration of the COVID-19 declaration under Section 564(b)(1) of the Act, 21 U.S.C.section 360bbb-3(b)(1), unless the authorization is terminated  or revoked sooner.       Influenza A by PCR NEGATIVE NEGATIVE Final   Influenza B by PCR NEGATIVE NEGATIVE Final    Comment: (NOTE) The Xpert Xpress SARS-CoV-2/FLU/RSV plus assay is intended as an aid in the diagnosis of influenza from Nasopharyngeal swab specimens and should not be used as a sole basis for treatment. Nasal washings and aspirates are unacceptable for Xpert Xpress SARS-CoV-2/FLU/RSV testing.  Fact Sheet for Patients: bloggercourse.com  Fact Sheet for Healthcare Providers: seriousbroker.it  This test is not yet approved or cleared by the [place]  FDA and has been authorized for detection and/or diagnosis of SARS-CoV-2 by FDA under an Emergency Use Authorization (EUA). This EUA will remain in effect (meaning this test can be used) for the duration of the COVID-19 declaration under Section 564(b)(1) of the Act, 21 U.S.C. section 360bbb-3(b)(1), unless the authorization is terminated or revoked.     Resp  Syncytial Virus by PCR NEGATIVE NEGATIVE Final    Comment: (NOTE) Fact Sheet for Patients: bloggercourse.com  Fact Sheet for Healthcare Providers: seriousbroker.it  This test is not yet approved or cleared by the [place]  FDA and has been authorized for detection and/or diagnosis of SARS-CoV-2 by FDA under an Emergency Use Authorization (EUA). This EUA will remain in effect (meaning this test can be used) for the duration of the COVID-19 declaration under Section 564(b)(1) of the Act, 21 U.S.C. section 360bbb-3(b)(1), unless the authorization is terminated or revoked.  Performed at [facility], [address] [place], [place] [address]     Coagulation Studies: No results for input(s): LABPROT, INR in the last 72 hours.   Urinalysis: No results for input(s): COLORURINE, LABSPEC, PHURINE, GLUCOSEU, HGBUR, BILIRUBINUR, KETONESUR, PROTEINUR, UROBILINOGEN, NITRITE, LEUKOCYTESUR in the last 72 hours.  Invalid input(s): APPERANCEUR    Imaging: PERIPHERAL VASCULAR CATHETERIZATION Result Date: 08/02/2024 See surgical note for result.    Medications:      amLODipine   5 mg Oral Daily   aspirin  EC  81 mg Oral  Daily   carvedilol   12.5 mg Oral BID   Chlorhexidine  Gluconate Cloth  6 each Topical Q0600   ezetimibe   10 mg Oral Daily   fluticasone  furoate-vilanterol  1 puff Inhalation Daily   guaiFENesin   1,200 mg Oral BID   heparin   5,000 Units Subcutaneous Q12H   hydrALAZINE   25 mg Oral Q8H   insulin  aspart  0-9 Units Subcutaneous TID WC   ipratropium-albuterol   3 mL Nebulization BID   isosorbide  mononitrate  30 mg Oral Daily   pravastatin   20 mg Oral QHS   predniSONE   40 mg Oral Q breakfast   tamsulosin   0.4 mg Oral Daily   acetaminophen , albuterol , ondansetron  (ZOFRAN ) IV  Assessment/ Plan:  Mr. Philemon Riedesel is a 73 y.o.  adult with past medical history of diabetes, peripheral  vascular disease, hypertension, congestive heart failure, hyperlipidemia, gout, BPH and chronic kidney disease with anemia and proteinuria. Patient presents to ED with shortness of breath and has been admitted for CHF (congestive heart failure) (HCC) [I50.9] SOB (shortness of breath) [R06.02] Hypoxia [R09.02] Acute on chronic systolic congestive heart failure (HCC) [I50.23] Acute respiratory failure with hypoxia and hypercarbia (HCC) [J96.01, J96.02]   Acute Kidney Injury on chronic kidney disease stage 4 with baseline creatinine 3.0 and GFR of 21 on 07/03/2024.  Acute kidney injury secondary to volume overload with IV diuresis. Creatinine baseline on Ed arrival, now 4.2. Was given IV furosemide  for diuresis. Now held.   Due to persistent renal decline, patient agreeable to proceed with dialysis. Appreciate vascular surgery placing HD temp cath. First dialysis treatment received on 12/5. Will receive third treatment tomorrow. Will request HD permcath from vascular. Will seek outpatient clinic placement tomorrow.    Lab Results  Component Value Date   CREATININE 5.35 (H) 08/03/2024   CREATININE 5.55 (H) 08/02/2024   CREATININE 4.96 (H) 08/01/2024    Intake/Output Summary (Last 24 hours) at 08/04/2024 1211 Last data filed at 08/04/2024 0057 Gross per 24 hour  Intake 720 ml  Output 200 ml  Net 520 ml   2. Chronic systolic heart failure , ECHO completed on 06/07/24 shows EF 40%. Prescribed Torsemide  40mg  twice daily, then decreased to daily, per patient.  Diuretics held. Volume status stable at this time. Will continue management with dialysis.   3. Anemia of chronic kidney disease  Lab Results  Component Value Date   HGB 9.2 (L) 08/03/2024    Hgb borderline. Continue low dose retacrit  with dialysis.   4. Hypertension with chronic kidney disease. Receiving amlodipine , carvedilol , hydralazine , and isosorbide . Blood pressure 124/66   LOS: 6 Eric Browning 12/7/202512:11 PM

## 2024-08-05 ENCOUNTER — Encounter: Payer: Self-pay | Admitting: Vascular Surgery

## 2024-08-05 LAB — BASIC METABOLIC PANEL WITH GFR
Anion gap: 13 (ref 5–15)
BUN: 102 mg/dL — ABNORMAL HIGH (ref 8–23)
CO2: 25 mmol/L (ref 22–32)
Calcium: 8.2 mg/dL — ABNORMAL LOW (ref 8.9–10.3)
Chloride: 101 mmol/L (ref 98–111)
Creatinine, Ser: 5.42 mg/dL — ABNORMAL HIGH (ref 0.61–1.24)
GFR, Estimated: 10 mL/min — ABNORMAL LOW (ref >60)
Glucose, Bld: 120 mg/dL — ABNORMAL HIGH (ref 70–99)
Potassium: 4.5 mmol/L (ref 3.5–5.1)
Sodium: 138 mmol/L (ref 135–145)

## 2024-08-05 LAB — GLUCOSE, CAPILLARY
Glucose-Capillary: 145 mg/dL — ABNORMAL HIGH (ref 70–99)
Glucose-Capillary: 109 mg/dL — ABNORMAL HIGH (ref 70–99)
Glucose-Capillary: 125 mg/dL — ABNORMAL HIGH (ref 70–99)
Glucose-Capillary: 169 mg/dL — ABNORMAL HIGH (ref 70–99)

## 2024-08-05 MED ORDER — LIDOCAINE HCL (PF) 1 % IJ SOLN
INTRAMUSCULAR | Status: DC | PRN
  Administered 2024-08-02: 20 mL

## 2024-08-05 MED ORDER — PREDNISONE 20 MG PO TABS
30.0000 mg | ORAL_TABLET | Freq: Every day | ORAL | Status: DC
  Administered 2024-08-06: 30 mg via ORAL

## 2024-08-05 MED ORDER — ORAL CARE MOUTH RINSE: 15.0000 mL | OROMUCOSAL | Status: DC | PRN

## 2024-08-05 MED ORDER — HEPARIN SODIUM (PORCINE) 1000 UNIT/ML IJ SOLN
INTRAMUSCULAR | Status: AC
  Filled 2024-08-05: qty 4

## 2024-08-05 MED ORDER — EPOETIN ALFA-EPBX 4000 UNIT/ML IJ SOLN
INTRAMUSCULAR | Status: AC
  Filled 2024-08-05: qty 1

## 2024-08-05 MED ORDER — EPOETIN ALFA-EPBX 4000 UNIT/ML IJ SOLN
4000.0000 [IU] | INTRAMUSCULAR | Status: DC
  Administered 2024-08-05 – 2024-08-09 (×3): 4000 [IU] via INTRAVENOUS
  Filled 2024-08-05: qty 1

## 2024-08-05 NOTE — Plan of Care (Signed)
  Problem: Fluid Volume: Goal: Ability to maintain a balanced intake and output will improve Outcome: Progressing   Problem: Clinical Measurements: Goal: Ability to maintain clinical measurements within normal limits will improve Outcome: Progressing   Problem: Nutrition: Goal: Adequate nutrition will be maintained Outcome: Progressing   Problem: Elimination: Goal: Will not experience complications related to bowel motility Outcome: Progressing

## 2024-08-05 NOTE — Plan of Care (Signed)

## 2024-08-05 NOTE — Progress Notes (Signed)
   08/05/24 1742  Vitals  Temp 97.9 F (36.6 C)  Temp Source Oral  BP (!) 153/76  MAP (mmHg) 100  BP Location Left Arm  BP Method Automatic  Patient Position (if appropriate) Lying  Pulse Rate 80  Pulse Rate Source Monitor  ECG Heart Rate 75  Resp 19  Oxygen Therapy  SpO2 96 %  During Treatment Monitoring  Blood Flow Rate (mL/min) 349 mL/min  Arterial Pressure (mmHg) -204.03 mmHg  Venous Pressure (mmHg) 154.94 mmHg  TMP (mmHg) 2.42 mmHg  Ultrafiltration Rate (mL/min) 453 mL/min  Dialysate Flow Rate (mL/min) 299 ml/min  Dialysate Potassium Concentration 2  Dialysate Calcium  Concentration 2.5  Duration of HD Treatment -hour(s) 3.47 hour(s)  Cumulative Fluid Removed (mL) per Treatment  988.46  HD Safety Checks Performed Yes  Intra-Hemodialysis Comments Tx completed  Post Treatment  Dialyzer Clearance Clear  Hemodialysis Intake (mL) 0 mL  Fluid Removed (mL) 1000 mL  Tolerated HD Treatment Yes  Post-Hemodialysis Comments Tx has been tolerated. removed without difficulty. Access is clean, dry and intact. Patient has no verbalized concerns.Reticrit 4000 has been received. No verbalized concerns post tx  Hemodialysis Catheter Right Femoral vein  No placement date or time found.   Orientation: Right  Access Location: Femoral vein  Site Condition No complications  Blue Lumen Status Antimicrobial dead end cap;Heparin  locked  Red Lumen Status Heparin  locked;Antimicrobial dead end cap  Purple Lumen Status Heparin  locked  Catheter fill solution Heparin  1000 units/ml  Catheter fill volume (Arterial) 1800 cc  Catheter fill volume (Venous) 1800  Dressing Type Transparent  Dressing Status Antimicrobial disc/dressing in place;Clean, Dry, Intact;Clean  Drainage Description None  Post treatment catheter status Capped and Clamped

## 2024-08-05 NOTE — Progress Notes (Signed)
 PROGRESS NOTE    Eric Browning  FMW:[id number] DOB: [birth year] DOA: 07/29/2024 PCP: Alla Amis, MD   Assessment & Plan:   Principal Problem:   CHF (congestive heart failure) (HCC) Active Problems:   AKI (acute kidney injury)   COPD with acute exacerbation (HCC)   Essential hypertension   Symptomatic anemia   Primary male hypogonadism   Pure hypercholesterolemia   Type 2 diabetes mellitus without complication, without long-term current use of insulin  (HCC)   Personal history of gout   Encounter for dialysis and dialysis catheter care   ESRD (end stage renal disease) (HCC)  Assessment and Plan: Acute on chronic systolic CHF exacerbation: holding lasix  as pt is now on HD. Continue on coreg . Monitor I/Os   COPD exacerbation: start steroid taper and continue on bronchodilators and encourage incentive spirometry   AKI on CKDIV: started on HD on 08/02/24 as per nephro. HD as per nephro. Nephro following and recs apprec    HTN: continue on imdur , coreg , amlodipine , hydralazine      DM2: likely poorly controlled. Continue on SSI w/ accuchecks   HLD: continue on zetia , statin  Hx of gout: holding home allopurinol  secondary to AKI on CKDIV currently requiring HD    Obesity: BMI 32.5. Would benefit from weight loss  Generalized weakness: PT/OT recs SNF. Pt initially agreed to SNF and then changed his mind to outpatient therapy   DVT prophylaxis: heparin   Code Status: full  Family Communication:  Disposition Plan: likely d/c to SNF  Status is: Inpatient Remains inpatient appropriate because: started HD this admission, severity of illness    Level of care: Progressive Consultants:  Nephro   Procedures:   Antimicrobials:   Subjective: Pt c/o fatigue  Objective: Vitals:   08/04/24 2353 08/05/24 0431 08/05/24 0432 08/05/24 0906  BP: 126/60  133/60 122/67  Pulse: 68  71 69  Resp: 18  20 18   Temp: 98.3 F (36.8 C)  97.8 F (36.6 C) 97.8 F (36.6 C)  TempSrc:       SpO2: 98%  98% 96%  Weight:  88.9 kg    Height:        Intake/Output Summary (Last 24 hours) at 08/05/2024 0952 Last data filed at 08/05/2024 0836 Gross per 24 hour  Intake 1320 ml  Output 200 ml  Net 1120 ml   Filed Weights   08/03/24 1130 08/04/24 0456 08/05/24 0431  Weight: 91.4 kg 94.2 kg 88.9 kg    Examination:  General exam: Appears comfortable  Respiratory system: clear breath sounds b/l Cardiovascular system: S1/S2+. No rubs or clicks  Gastrointestinal system: Abd is soft, NT, obese & hypoactive bowel sounds Central nervous system: alert & oriented. Moves all extremities  Psychiatry: Judgement and insight appears at baseline. Flat mood and affect    Data Reviewed: I have personally reviewed following labs and imaging studies  CBC: Recent Labs  Lab 07/31/24 0406 08/01/24 0410 08/02/24 1531 08/03/24 0853  WBC 13.8* 12.0* 9.3 10.0  HGB 9.2* 9.2* 9.0* 9.2*  HCT 29.3* 29.9* 28.7* 29.5*  MCV 85.2 84.9 84.4 85.5  PLT 311 280 234 214   Basic Metabolic Panel: Recent Labs  Lab 07/31/24 0406 08/01/24 0410 08/02/24 0908 08/03/24 0853 08/05/24 0407  NA 141 136 137 137 138  K 4.4 4.6 4.7 4.6 4.5  CL 103 99 101 100 101  CO2 26 24 21* 25 25  GLUCOSE 141* 164* 122* 99 120*  BUN 74* 96* 118* 107* 102*  CREATININE 4.26* 4.96* 5.55* 5.35*  5.42*  CALCIUM  8.3* 7.9* 7.8* 7.5* 8.2*  MG  --  2.8*  --   --   --   PHOS  --   --  7.6* 7.7*  --    GFR: Estimated Creatinine Clearance (by C-G formula based on SCr of 5.42 mg/dL (H)) Male: 89.6 mL/min (A) Male: 12.5 mL/min (A) Liver Function Tests: Recent Labs  Lab 08/02/24 0908 08/03/24 0853  ALBUMIN 3.7 3.3*   No results for input(s): LIPASE, AMYLASE in the last 168 hours. No results for input(s): AMMONIA in the last 168 hours. Coagulation Profile: No results for input(s): INR, PROTIME in the last 168 hours.  Cardiac Enzymes: No results for input(s): CKTOTAL, CKMB, CKMBINDEX, TROPONINI in  the last 168 hours. BNP (last 3 results) Recent Labs    07/29/24 0800  PROBNP 7,322.0*   HbA1C: No results for input(s): HGBA1C in the last 72 hours. CBG: Recent Labs  Lab 08/04/24 0823 08/04/24 1149 08/04/24 1646 08/04/24 2101 08/05/24 0909  GLUCAP 100* 116* 139* 159* 145*   Lipid Profile: No results for input(s): CHOL, HDL, LDLCALC, TRIG, CHOLHDL, LDLDIRECT in the last 72 hours. Thyroid Function Tests: No results for input(s): TSH, T4TOTAL, FREET4, T3FREE, THYROIDAB in the last 72 hours. Anemia Panel: No results for input(s): VITAMINB12, FOLATE, FERRITIN, TIBC, IRON, RETICCTPCT in the last 72 hours. Sepsis Labs: No results for input(s): PROCALCITON, LATICACIDVEN in the last 168 hours.  Recent Results (from the past 240 hours)  Resp panel by RT-PCR (RSV, Flu A&B, Covid) Anterior Nasal Swab     Status: None   Collection Time: 07/29/24  7:57 AM   Specimen: Anterior Nasal Swab  Result Value Ref Range Status   SARS Coronavirus 2 by RT PCR NEGATIVE NEGATIVE Final    Comment: (NOTE) SARS-CoV-2 target nucleic acids are NOT DETECTED.  The SARS-CoV-2 RNA is generally detectable in upper respiratory specimens during the acute phase of infection. The lowest concentration of SARS-CoV-2 viral copies this assay can detect is 138 copies/mL. A negative result does not preclude SARS-Cov-2 infection and should not be used as the sole basis for treatment or other patient management decisions. A negative result may occur with  improper specimen collection/handling, submission of specimen other than nasopharyngeal swab, presence of viral mutation(s) within the areas targeted by this assay, and inadequate number of viral copies(<138 copies/mL). A negative result must be combined with clinical observations, patient history, and epidemiological information. The expected result is Negative.  Fact Sheet for Patients:   bloggercourse.com  Fact Sheet for Healthcare Providers:  seriousbroker.it  This test is no t yet approved or cleared by the [place]  FDA and  has been authorized for detection and/or diagnosis of SARS-CoV-2 by FDA under an Emergency Use Authorization (EUA). This EUA will remain  in effect (meaning this test can be used) for the duration of the COVID-19 declaration under Section 564(b)(1) of the Act, 21 U.S.C.section 360bbb-3(b)(1), unless the authorization is terminated  or revoked sooner.       Influenza A by PCR NEGATIVE NEGATIVE Final   Influenza B by PCR NEGATIVE NEGATIVE Final    Comment: (NOTE) The Xpert Xpress SARS-CoV-2/FLU/RSV plus assay is intended as an aid in the diagnosis of influenza from Nasopharyngeal swab specimens and should not be used as a sole basis for treatment. Nasal washings and aspirates are unacceptable for Xpert Xpress SARS-CoV-2/FLU/RSV testing.  Fact Sheet for Patients: bloggercourse.com  Fact Sheet for Healthcare Providers: seriousbroker.it  This test is not yet approved or cleared  by the [place]  FDA and has been authorized for detection and/or diagnosis of SARS-CoV-2 by FDA under an Emergency Use Authorization (EUA). This EUA will remain in effect (meaning this test can be used) for the duration of the COVID-19 declaration under Section 564(b)(1) of the Act, 21 U.S.C. section 360bbb-3(b)(1), unless the authorization is terminated or revoked.     Resp Syncytial Virus by PCR NEGATIVE NEGATIVE Final    Comment: (NOTE) Fact Sheet for Patients: bloggercourse.com  Fact Sheet for Healthcare Providers: seriousbroker.it  This test is not yet approved or cleared by the [place]  FDA and has been authorized for detection and/or diagnosis of SARS-CoV-2 by FDA under an Emergency Use  Authorization (EUA). This EUA will remain in effect (meaning this test can be used) for the duration of the COVID-19 declaration under Section 564(b)(1) of the Act, 21 U.S.C. section 360bbb-3(b)(1), unless the authorization is terminated or revoked.  Performed at [facility], [address], [place], [place] [address]          Radiology Studies: No results found.       Scheduled Meds:  amLODipine   5 mg Oral Daily   aspirin  EC  81 mg Oral Daily   carvedilol   12.5 mg Oral BID   Chlorhexidine  Gluconate Cloth  6 each Topical Q0600   ezetimibe   10 mg Oral Daily   fluticasone  furoate-vilanterol  1 puff Inhalation Daily   guaiFENesin   1,200 mg Oral BID   heparin   5,000 Units Subcutaneous Q12H   hydrALAZINE   25 mg Oral Q8H   insulin  aspart  0-9 Units Subcutaneous TID WC   isosorbide  mononitrate  30 mg Oral Daily   pravastatin   20 mg Oral QHS   predniSONE   40 mg Oral Q breakfast   tamsulosin   0.4 mg Oral Daily   Continuous Infusions:   LOS: 7 days       Anthony CHRISTELLA Pouch, MD [facility] Pager [phone]  If 7PM-7AM, please contact night-coverage [email or web address] 08/05/2024, 9:52 AM

## 2024-08-05 NOTE — Progress Notes (Signed)
 [facility]  ROUNDING NOTE   Subjective:   Eric Browning is a 73 y.o. male with past medical history of diabetes, peripheral vascular disease, hypertension, congestive heart failure, hyperlipidemia, gout, BPH and chronic kidney disease with anemia and proteinuria. Patient presents to ED with shortness of breath and has been admitted for CHF (congestive heart failure) (HCC) [I50.9] SOB (shortness of breath) [R06.02] Hypoxia [R09.02] Acute on chronic systolic congestive heart failure (HCC) [I50.23] Acute respiratory failure with hypoxia and hypercarbia (HCC) [J96.01, J96.02]  Patient is known to our practice and was seen by Dr Dennise in October.   Update Patient resting in bed Remains on room air Tolerating meals No complaints   Objective:  Vital signs in last 24 hours:  Temp:  [97.5 F (36.4 C)-98.3 F (36.8 C)] 98.3 F (36.8 C) (12/08 1353) Pulse Rate:  [54-82] 82 (12/08 1500) Resp:  [16-20] 17 (12/08 1500) BP: (122-147)/(60-91) 125/64 (12/08 1500) SpO2:  [93 %-98 %] 98 % (12/08 1500) Weight:  [88.9 kg] 88.9 kg (12/08 0431)  Weight change: -2.9 kg Filed Weights   08/03/24 1130 08/04/24 0456 08/05/24 0431  Weight: 91.4 kg 94.2 kg 88.9 kg    Intake/Output: I/O last 3 completed shifts: In: 1800 [P.O.:1800] Out: 200 [Urine:200]   Intake/Output this shift:  Total I/O In: 240 [P.O.:240] Out: 200 [Urine:200]  Physical Exam: General: NAD  Head: Normocephalic, atraumatic. Moist oral mucosal membranes  Eyes: Anicteric  Lungs:  Clear to auscultation, normal effort  Heart: Regular rate and rhythm  Abdomen:  Soft, nontender  Extremities:  Trace peripheral edema.  Neurologic: Awake, alert, conversant  Skin: Warm,dry, no rash  Access Rt femoral tempcath    Basic Metabolic Panel: Recent Labs  Lab 07/31/24 0406 08/01/24 0410 08/02/24 0908 08/03/24 0853 08/05/24 0407  NA 141 136 137 137 138  K 4.4 4.6 4.7 4.6 4.5  CL 103 99 101 100 101  CO2 26 24 21*  25 25  GLUCOSE 141* 164* 122* 99 120*  BUN 74* 96* 118* 107* 102*  CREATININE 4.26* 4.96* 5.55* 5.35* 5.42*  CALCIUM  8.3* 7.9* 7.8* 7.5* 8.2*  MG  --  2.8*  --   --   --   PHOS  --   --  7.6* 7.7*  --     Liver Function Tests: Recent Labs  Lab 08/02/24 0908 08/03/24 0853  ALBUMIN 3.7 3.3*   No results for input(s): LIPASE, AMYLASE in the last 168 hours. No results for input(s): AMMONIA in the last 168 hours.  CBC: Recent Labs  Lab 07/31/24 0406 08/01/24 0410 08/02/24 1531 08/03/24 0853  WBC 13.8* 12.0* 9.3 10.0  HGB 9.2* 9.2* 9.0* 9.2*  HCT 29.3* 29.9* 28.7* 29.5*  MCV 85.2 84.9 84.4 85.5  PLT 311 280 234 214    Cardiac Enzymes: No results for input(s): CKTOTAL, CKMB, CKMBINDEX, TROPONINI in the last 168 hours.  BNP: Invalid input(s): POCBNP  CBG: Recent Labs  Lab 08/04/24 1149 08/04/24 1646 08/04/24 2101 08/05/24 0909 08/05/24 1242  GLUCAP 116* 139* 159* 145* 109*    Microbiology: Results for orders placed or performed during the hospital encounter of 07/29/24  Resp panel by RT-PCR (RSV, Flu A&B, Covid) Anterior Nasal Swab     Status: None   Collection Time: 07/29/24  7:57 AM   Specimen: Anterior Nasal Swab  Result Value Ref Range Status   SARS Coronavirus 2 by RT PCR NEGATIVE NEGATIVE Final    Comment: (NOTE) SARS-CoV-2 target nucleic acids are NOT DETECTED.  The SARS-CoV-2 RNA is generally detectable in upper respiratory specimens during the acute phase of infection. The lowest concentration of SARS-CoV-2 viral copies this assay can detect is 138 copies/mL. A negative result does not preclude SARS-Cov-2 infection and should not be used as the sole basis for treatment or other patient management decisions. A negative result may occur with  improper specimen collection/handling, submission of specimen other than nasopharyngeal swab, presence of viral mutation(s) within the areas targeted by this assay, and inadequate number of  viral copies(<138 copies/mL). A negative result must be combined with clinical observations, patient history, and epidemiological information. The expected result is Negative.  Fact Sheet for Patients:  bloggercourse.com  Fact Sheet for Healthcare Providers:  seriousbroker.it  This test is no t yet approved or cleared by the [place]  FDA and  has been authorized for detection and/or diagnosis of SARS-CoV-2 by FDA under an Emergency Use Authorization (EUA). This EUA will remain  in effect (meaning this test can be used) for the duration of the COVID-19 declaration under Section 564(b)(1) of the Act, 21 U.S.C.section 360bbb-3(b)(1), unless the authorization is terminated  or revoked sooner.       Influenza A by PCR NEGATIVE NEGATIVE Final   Influenza B by PCR NEGATIVE NEGATIVE Final    Comment: (NOTE) The Xpert Xpress SARS-CoV-2/FLU/RSV plus assay is intended as an aid in the diagnosis of influenza from Nasopharyngeal swab specimens and should not be used as a sole basis for treatment. Nasal washings and aspirates are unacceptable for Xpert Xpress SARS-CoV-2/FLU/RSV testing.  Fact Sheet for Patients: bloggercourse.com  Fact Sheet for Healthcare Providers: seriousbroker.it  This test is not yet approved or cleared by the [place]  FDA and has been authorized for detection and/or diagnosis of SARS-CoV-2 by FDA under an Emergency Use Authorization (EUA). This EUA will remain in effect (meaning this test can be used) for the duration of the COVID-19 declaration under Section 564(b)(1) of the Act, 21 U.S.C. section 360bbb-3(b)(1), unless the authorization is terminated or revoked.     Resp Syncytial Virus by PCR NEGATIVE NEGATIVE Final    Comment: (NOTE) Fact Sheet for Patients: bloggercourse.com  Fact Sheet for Healthcare  Providers: seriousbroker.it  This test is not yet approved or cleared by the [place]  FDA and has been authorized for detection and/or diagnosis of SARS-CoV-2 by FDA under an Emergency Use Authorization (EUA). This EUA will remain in effect (meaning this test can be used) for the duration of the COVID-19 declaration under Section 564(b)(1) of the Act, 21 U.S.C. section 360bbb-3(b)(1), unless the authorization is terminated or revoked.  Performed at [facility], [address] [place], [place] [address]     Coagulation Studies: No results for input(s): LABPROT, INR in the last 72 hours.   Urinalysis: No results for input(s): COLORURINE, LABSPEC, PHURINE, GLUCOSEU, HGBUR, BILIRUBINUR, KETONESUR, PROTEINUR, UROBILINOGEN, NITRITE, LEUKOCYTESUR in the last 72 hours.  Invalid input(s): APPERANCEUR    Imaging: No results found.    Medications:      amLODipine   5 mg Oral Daily   aspirin  EC  81 mg Oral Daily   carvedilol   12.5 mg Oral BID   Chlorhexidine  Gluconate Cloth  6 each Topical Q0600   ezetimibe   10 mg Oral Daily   fluticasone  furoate-vilanterol  1 puff Inhalation Daily   guaiFENesin   1,200 mg Oral BID   heparin   5,000 Units Subcutaneous Q12H   hydrALAZINE   25 mg Oral Q8H   insulin  aspart  0-9 Units Subcutaneous TID  WC   isosorbide  mononitrate  30 mg Oral Daily   pravastatin   20 mg Oral QHS   [START ON 08/06/2024] predniSONE   30 mg Oral Q breakfast   tamsulosin   0.4 mg Oral Daily   acetaminophen , albuterol , lidocaine  (PF), ondansetron  (ZOFRAN ) IV, mouth rinse  Assessment/ Plan:  Eric Browning is a 73 y.o.  adult with past medical history of diabetes, peripheral vascular disease, hypertension, congestive heart failure, hyperlipidemia, gout, BPH and chronic kidney disease with anemia and proteinuria. Patient presents to ED with shortness of breath and has been admitted for CHF (congestive heart  failure) (HCC) [I50.9] SOB (shortness of breath) [R06.02] Hypoxia [R09.02] Acute on chronic systolic congestive heart failure (HCC) [I50.23] Acute respiratory failure with hypoxia and hypercarbia (HCC) [J96.01, J96.02]   Acute Kidney Injury on chronic kidney disease stage 4 with baseline creatinine 3.0 and GFR of 21 on 07/03/2024.  Acute kidney injury secondary to volume overload with IV diuresis. Creatinine baseline on Ed arrival, now 4.2. Was given IV furosemide  for diuresis. Now held.   Due to persistent renal decline, patient agreeable to proceed with dialysis. Appreciate vascular surgery placing HD temp cath. First dialysis treatment received on 12/5. Will request permcath this week. Dialysis navigator seeking outpatient clinic.    Lab Results  Component Value Date   CREATININE 5.42 (H) 08/05/2024   CREATININE 5.35 (H) 08/03/2024   CREATININE 5.55 (H) 08/02/2024    Intake/Output Summary (Last 24 hours) at 08/05/2024 1555 Last data filed at 08/05/2024 0836 Gross per 24 hour  Intake 1320 ml  Output 200 ml  Net 1120 ml   2. Chronic systolic heart failure , ECHO completed on 06/07/24 shows EF 40%. Prescribed Torsemide  40mg  twice daily, then decreased to daily, per patient. Diuretics held.  Volume status stable at this time. Will continue management with dialysis.   3. Anemia of chronic kidney disease  Lab Results  Component Value Date   HGB 9.2 (L) 08/03/2024    Hgb borderline. Continue low dose retacrit  with dialysis.   4. Hypertension with chronic kidney disease. Receiving amlodipine , carvedilol , hydralazine , and isosorbide . Blood pressure stable   LOS: 7 Eric Browning 12/8/20253:55 PM

## 2024-08-06 LAB — GLUCOSE, CAPILLARY
Glucose-Capillary: 100 mg/dL — ABNORMAL HIGH (ref 70–99)
Glucose-Capillary: 85 mg/dL (ref 70–99)
Glucose-Capillary: 94 mg/dL (ref 70–99)

## 2024-08-06 LAB — BASIC METABOLIC PANEL WITH GFR
Anion gap: 11 (ref 5–15)
BUN: 62 mg/dL — ABNORMAL HIGH (ref 8–23)
CO2: 26 mmol/L (ref 22–32)
Calcium: 7.7 mg/dL — ABNORMAL LOW (ref 8.9–10.3)
Chloride: 100 mmol/L (ref 98–111)
Creatinine, Ser: 3.61 mg/dL — ABNORMAL HIGH (ref 0.61–1.24)
GFR, Estimated: 17 mL/min — ABNORMAL LOW (ref >60)
Glucose, Bld: 120 mg/dL — ABNORMAL HIGH (ref 70–99)
Potassium: 4.1 mmol/L (ref 3.5–5.1)
Sodium: 137 mmol/L (ref 135–145)

## 2024-08-06 MED ORDER — PREDNISONE 20 MG PO TABS
20.0000 mg | ORAL_TABLET | Freq: Every day | ORAL | Status: AC
  Administered 2024-08-07 – 2024-08-08 (×2): 20 mg via ORAL
  Filled 2024-08-06 (×2): qty 1

## 2024-08-06 MED ORDER — TORSEMIDE 20 MG PO TABS
40.0000 mg | ORAL_TABLET | Freq: Every day | ORAL | Status: DC
  Administered 2024-08-06 – 2024-08-09 (×4): 40 mg via ORAL
  Filled 2024-08-06 (×4): qty 2

## 2024-08-06 NOTE — Progress Notes (Signed)
 Requested to see pt for outpt HD needs at d/c. Introduced self and explained role. Discussed HD options. Pt prefers  [facility]. Referral submitted to  [facility] for review.  Suzen Satchel Dialysis Navigator [phone]

## 2024-08-06 NOTE — H&P (View-Only) (Signed)
 Progress Note    08/06/2024 7:33 AM 4 Days Post-Op  Subjective:  Eric Browning is a 73 yo male now POD #4 from:  PROCEDURE: Insertion of temporary dialysis catheter catheter right femoral approach.   PRE-OPERATIVE DIAGNOSIS: Acute on chronic renal insufficiency requiring hemodialysis   POST-OPERATIVE DIAGNOSIS: Same   SURGEON: Cordella KANDICE Shawl M.D.   ANESTHESIA: 1% lidocaine  local infiltration   ESTIMATED BLOOD LOSS: Minimal cc  Patient resting comfortably sitting at the bedside this morning with a family member. No complaints overnight and vitals all remain stable.     Vitals:   08/05/24 2338 08/06/24 0531  BP: (!) 117/59 (!) 127/54  Pulse: 64 73  Resp: 19 18  Temp: 97.8 F (36.6 C)   SpO2: 98% 97%   Physical Exam: Cardiac:  RRR, Normal S1, S2. No Murmurs Lungs:  Non labored and clear on auscultation throughout. Incisions:  Right groin with temp catheter Extremities:  All extremities are warm to touch with palpable pulses.  Abdomen:  Positive bowel sounds throughout, soft non tender and non distended Neurologic: AAOX3, answers all questions and follows commands.   CBC    Component Value Date/Time   WBC 10.0 08/03/2024 0853   RBC 3.45 (L) 08/03/2024 0853   HGB 9.2 (L) 08/03/2024 0853   HGB 9.4 (L) 04/24/2024 1409   HCT 29.5 (L) 08/03/2024 0853   PLT 214 08/03/2024 0853   PLT 308 04/24/2024 1409   MCV 85.5 08/03/2024 0853   MCH 26.7 08/03/2024 0853   MCHC 31.2 08/03/2024 0853   RDW 16.0 (H) 08/03/2024 0853   LYMPHSABS 1.3 04/24/2024 1409   MONOABS 0.7 04/24/2024 1409   EOSABS 0.2 04/24/2024 1409   BASOSABS 0.1 04/24/2024 1409    BMET    Component Value Date/Time   NA 137 08/06/2024 0356   K 4.1 08/06/2024 0356   K 3.6 10/31/2011 0614   CL 100 08/06/2024 0356   CO2 26 08/06/2024 0356   GLUCOSE 120 (H) 08/06/2024 0356   BUN 62 (H) 08/06/2024 0356   CREATININE 3.61 (H) 08/06/2024 0356   CREATININE 3.00 (H) 04/24/2024 1409   CALCIUM  7.7 (L)  08/06/2024 0356   GFRNONAA 17 (L) 08/06/2024 0356   GFRNONAA 21 (L) 04/24/2024 1409   GFRAA >60 12/13/2017 0716    INR    Component Value Date/Time   INR 1.1 07/29/2024 0800     Intake/Output Summary (Last 24 hours) at 08/06/2024 0733 Last data filed at 08/06/2024 0400 Gross per 24 hour  Intake 960 ml  Output 1200 ml  Net -240 ml     Assessment/Plan:  73 y.o. adult is s/p SEE ABOVE  4 Days Post-Op   PLAN  Vascular Surgery was consulted to place a Dialysis Perma catheter for outpatient hemodialysis. Vascular surgery plans on taking the patient to the vascular lab on Wednesday 08/07/24 for dialysis permacatheter placement.  I discussed in detail today at the bedside with the patient the procedure, benefits, risk, and complications.  Patient verbalizes understanding and wishes to proceed.  Patient will remain n.p.o. after midnight the day of his procedure.  Patient's last noted BUN and creatinine are 62 and 3.61.  Patient's last CBC on 08/03/2024 as patient's hemoglobin at 9.2 and hematocrit of 29.5 and platelets at 214.  Patient is currently getting heparin  5000 units subcu every 12 hour, and this will be held for the procedure tomorrow.  I discussed the case in detail with Dr. Selinda Gu MD and he agrees with the plan.  DVT prophylaxis: Heparin  5000 units subcutaneous every 12 hours   [name] [facility] 08/06/2024 7:33 AM

## 2024-08-06 NOTE — Progress Notes (Signed)
 PROGRESS NOTE    Eric Browning  FMW:[id number] DOB: 24-Mar-1951 DOA: 07/29/2024 PCP: Alla Amis, MD   Assessment & Plan:   Principal Problem:   CHF (congestive heart failure) (HCC) Active Problems:   AKI (acute kidney injury)   COPD with acute exacerbation (HCC)   Essential hypertension   Symptomatic anemia   Primary male hypogonadism   Pure hypercholesterolemia   Type 2 diabetes mellitus without complication, without long-term current use of insulin  (HCC)   Personal history of gout   Encounter for dialysis and dialysis catheter care   ESRD (end stage renal disease) (HCC)  Assessment and Plan: Acute on chronic systolic CHF exacerbation: holding lasix  as pt is now on HD. Continue on coreg . Monitor I/Os   COPD exacerbation: continue on steroid taper, bronchodilators & encourage incentive spirometry    AKI on CKDIV: started on HD on 08/02/24 as per nephro. Will get tunneled HD cath tomorrow. Still needs an outpatient HD clinic/time. Nephro following and recs apprec   HTN: continue on imdur , coreg , amlodipine , hydralazine    DM2: likely poorly controlled. Continue on SSI w/ accuchecks   HLD: continue on zetia , statin  Hx of gout: holding home allopurinol  secondary to AKI on CKDIV currently requiring HD    Obesity: BMI 32.5. Would benefit from weight loss    DVT prophylaxis: heparin   Code Status: full  Family Communication: Disposition Plan: likely d/c to SNF  Status is: Inpatient Remains inpatient appropriate because: started HD this admission, severity of illness    Level of care: Progressive Consultants:  Nephro   Procedures:   Antimicrobials:   Subjective: Pt c/o fatigue  Objective: Vitals:   08/05/24 2007 08/05/24 2338 08/06/24 0500 08/06/24 0531  BP: 134/66 (!) 117/59  (!) 127/54  Pulse: 69 64  73  Resp: 18 19  18   Temp: 98.3 F (36.8 C) 97.8 F (36.6 C)    TempSrc:      SpO2: 95% 98%  97%  Weight:   92.1 kg   Height:         Intake/Output Summary (Last 24 hours) at 08/06/2024 0701 Last data filed at 08/06/2024 0400 Gross per 24 hour  Intake 960 ml  Output 1200 ml  Net -240 ml   Filed Weights   08/05/24 0431 08/05/24 1800 08/06/24 0500  Weight: 88.9 kg 87.7 kg 92.1 kg    Examination:  General exam: Appears comfortable Respiratory system: decreased breath sounds b/l Cardiovascular system: S1/S2+. No rubs or clicks  Gastrointestinal system: Abd is soft, NT, obese & hypoactive bowel sounds Central nervous system: alert & oriented. Moves all extremities Psychiatry: Judgement and insight appears at baseline. Flat mood and affect    Data Reviewed: I have personally reviewed following labs and imaging studies  CBC: Recent Labs  Lab 07/31/24 0406 08/01/24 0410 08/02/24 1531 08/03/24 0853  WBC 13.8* 12.0* 9.3 10.0  HGB 9.2* 9.2* 9.0* 9.2*  HCT 29.3* 29.9* 28.7* 29.5*  MCV 85.2 84.9 84.4 85.5  PLT 311 280 234 214   Basic Metabolic Panel: Recent Labs  Lab 08/01/24 0410 08/02/24 0908 08/03/24 0853 08/05/24 0407 08/06/24 0356  NA 136 137 137 138 137  K 4.6 4.7 4.6 4.5 4.1  CL 99 101 100 101 100  CO2 24 21* 25 25 26   GLUCOSE 164* 122* 99 120* 120*  BUN 96* 118* 107* 102* 62*  CREATININE 4.96* 5.55* 5.35* 5.42* 3.61*  CALCIUM  7.9* 7.8* 7.5* 8.2* 7.7*  MG 2.8*  --   --   --   --  PHOS  --  7.6* 7.7*  --   --    GFR: Estimated Creatinine Clearance (by C-G formula based on SCr of 3.61 mg/dL (H)) Male: 84.2 mL/min (A) Male: 19.1 mL/min (A) Liver Function Tests: Recent Labs  Lab 08/02/24 0908 08/03/24 0853  ALBUMIN 3.7 3.3*   No results for input(s): LIPASE, AMYLASE in the last 168 hours. No results for input(s): AMMONIA in the last 168 hours. Coagulation Profile: No results for input(s): INR, PROTIME in the last 168 hours.  Cardiac Enzymes: No results for input(s): CKTOTAL, CKMB, CKMBINDEX, TROPONINI in the last 168 hours. BNP (last 3 results) Recent Labs     07/29/24 0800  PROBNP 7,322.0*   HbA1C: No results for input(s): HGBA1C in the last 72 hours. CBG: Recent Labs  Lab 08/04/24 2101 08/05/24 0909 08/05/24 1242 08/05/24 1828 08/05/24 2126  GLUCAP 159* 145* 109* 125* 169*   Lipid Profile: No results for input(s): CHOL, HDL, LDLCALC, TRIG, CHOLHDL, LDLDIRECT in the last 72 hours. Thyroid Function Tests: No results for input(s): TSH, T4TOTAL, FREET4, T3FREE, THYROIDAB in the last 72 hours. Anemia Panel: No results for input(s): VITAMINB12, FOLATE, FERRITIN, TIBC, IRON, RETICCTPCT in the last 72 hours. Sepsis Labs: No results for input(s): PROCALCITON, LATICACIDVEN in the last 168 hours.  Recent Results (from the past 240 hours)  Resp panel by RT-PCR (RSV, Flu A&B, Covid) Anterior Nasal Swab     Status: None   Collection Time: 07/29/24  7:57 AM   Specimen: Anterior Nasal Swab  Result Value Ref Range Status   SARS Coronavirus 2 by RT PCR NEGATIVE NEGATIVE Final    Comment: (NOTE) SARS-CoV-2 target nucleic acids are NOT DETECTED.  The SARS-CoV-2 RNA is generally detectable in upper respiratory specimens during the acute phase of infection. The lowest concentration of SARS-CoV-2 viral copies this assay can detect is 138 copies/mL. A negative result does not preclude SARS-Cov-2 infection and should not be used as the sole basis for treatment or other patient management decisions. A negative result may occur with  improper specimen collection/handling, submission of specimen other than nasopharyngeal swab, presence of viral mutation(s) within the areas targeted by this assay, and inadequate number of viral copies(<138 copies/mL). A negative result must be combined with clinical observations, patient history, and epidemiological information. The expected result is Negative.  Fact Sheet for Patients:  bloggercourse.com  Fact Sheet for Healthcare Providers:   seriousbroker.it  This test is no t yet approved or cleared by the [place]  FDA and  has been authorized for detection and/or diagnosis of SARS-CoV-2 by FDA under an Emergency Use Authorization (EUA). This EUA will remain  in effect (meaning this test can be used) for the duration of the COVID-19 declaration under Section 564(b)(1) of the Act, 21 U.S.C.section 360bbb-3(b)(1), unless the authorization is terminated  or revoked sooner.       Influenza A by PCR NEGATIVE NEGATIVE Final   Influenza B by PCR NEGATIVE NEGATIVE Final    Comment: (NOTE) The Xpert Xpress SARS-CoV-2/FLU/RSV plus assay is intended as an aid in the diagnosis of influenza from Nasopharyngeal swab specimens and should not be used as a sole basis for treatment. Nasal washings and aspirates are unacceptable for Xpert Xpress SARS-CoV-2/FLU/RSV testing.  Fact Sheet for Patients: bloggercourse.com  Fact Sheet for Healthcare Providers: seriousbroker.it  This test is not yet approved or cleared by the [place]  FDA and has been authorized for detection and/or diagnosis of SARS-CoV-2 by FDA under an Emergency Use Authorization (EUA). This  EUA will remain in effect (meaning this test can be used) for the duration of the COVID-19 declaration under Section 564(b)(1) of the Act, 21 U.S.C. section 360bbb-3(b)(1), unless the authorization is terminated or revoked.     Resp Syncytial Virus by PCR NEGATIVE NEGATIVE Final    Comment: (NOTE) Fact Sheet for Patients: bloggercourse.com  Fact Sheet for Healthcare Providers: seriousbroker.it  This test is not yet approved or cleared by the [place]  FDA and has been authorized for detection and/or diagnosis of SARS-CoV-2 by FDA under an Emergency Use Authorization (EUA). This EUA will remain in effect (meaning this test can be used) for  the duration of the COVID-19 declaration under Section 564(b)(1) of the Act, 21 U.S.C. section 360bbb-3(b)(1), unless the authorization is terminated or revoked.  Performed at [facility], [address] [place], [place] [address]          Radiology Studies: No results found.       Scheduled Meds:  amLODipine   5 mg Oral Daily   aspirin  EC  81 mg Oral Daily   carvedilol   12.5 mg Oral BID   Chlorhexidine  Gluconate Cloth  6 each Topical Q0600   epoetin  alfa-epbx (RETACRIT ) injection  4,000 Units Intravenous Q M,W,F-1800   ezetimibe   10 mg Oral Daily   fluticasone  furoate-vilanterol  1 puff Inhalation Daily   guaiFENesin   1,200 mg Oral BID   heparin   5,000 Units Subcutaneous Q12H   hydrALAZINE   25 mg Oral Q8H   insulin  aspart  0-9 Units Subcutaneous TID WC   isosorbide  mononitrate  30 mg Oral Daily   pravastatin   20 mg Oral QHS   predniSONE   30 mg Oral Q breakfast   tamsulosin   0.4 mg Oral Daily   Continuous Infusions:   LOS: 8 days       Anthony CHRISTELLA Pouch, MD [facility] Pager [phone]  If 7PM-7AM, please contact night-coverage [email or web address] 08/06/2024, 7:01 AM

## 2024-08-06 NOTE — Plan of Care (Signed)

## 2024-08-06 NOTE — TOC Progression Note (Signed)
 Transition of Care [facility]) - Progression Note    Patient Details  Name: Eric Browning MRN: [id number] Date of Birth: 15-Nov-1950  Transition of Care [facility]) CM/SW Contact  Lauraine JAYSON Carpen, LCSW Phone Number: 08/06/2024, 11:02 AM  Clinical Narrative:  Patient is agreeable to SNF placement. He lives near [facility] and [facility]. Will follow up with bed offers once available.   Expected Discharge Plan: Home/Self Care Barriers to Discharge: Continued Medical Work up               Expected Discharge Plan and Services                                               Social Drivers of Health (SDOH) Interventions SDOH Screenings   Food Insecurity: No Food Insecurity (07/29/2024)  Housing: Unknown (07/29/2024)  Transportation Needs: No Transportation Needs (07/29/2024)  Utilities: Not At Risk (07/29/2024)  Depression (PHQ2-9): Low Risk  (04/24/2024)  Financial Resource Strain: Low Risk  (07/10/2024)   Received from [facility]  Social Connections: Unknown (07/29/2024)  Tobacco Use: Medium Risk (07/30/2024)    Readmission Risk Interventions     No data to display

## 2024-08-06 NOTE — Progress Notes (Signed)
 Progress Note    08/06/2024 7:33 AM 4 Days Post-Op  Subjective:  Eric Browning is a 73 yo male now POD #4 from:  PROCEDURE: Insertion of temporary dialysis catheter catheter right femoral approach.   PRE-OPERATIVE DIAGNOSIS: Acute on chronic renal insufficiency requiring hemodialysis   POST-OPERATIVE DIAGNOSIS: Same   SURGEON: Eric Browning M.D.   ANESTHESIA: 1% lidocaine  local infiltration   ESTIMATED BLOOD LOSS: Minimal cc  Patient resting comfortably sitting at the bedside this morning with a family member. No complaints overnight and vitals all remain stable.     Vitals:   08/05/24 2338 08/06/24 0531  BP: (!) 117/59 (!) 127/54  Pulse: 64 73  Resp: 19 18  Temp: 97.8 F (36.6 C)   SpO2: 98% 97%   Physical Exam: Cardiac:  RRR, Normal S1, S2. No Murmurs Lungs:  Non labored and clear on auscultation throughout. Incisions:  Right groin with temp catheter Extremities:  All extremities are warm to touch with palpable pulses.  Abdomen:  Positive bowel sounds throughout, soft non tender and non distended Neurologic: AAOX3, answers all questions and follows commands.   CBC    Component Value Date/Time   WBC 10.0 08/03/2024 0853   RBC 3.45 (L) 08/03/2024 0853   HGB 9.2 (L) 08/03/2024 0853   HGB 9.4 (L) 04/24/2024 1409   HCT 29.5 (L) 08/03/2024 0853   PLT 214 08/03/2024 0853   PLT 308 04/24/2024 1409   MCV 85.5 08/03/2024 0853   MCH 26.7 08/03/2024 0853   MCHC 31.2 08/03/2024 0853   RDW 16.0 (H) 08/03/2024 0853   LYMPHSABS 1.3 04/24/2024 1409   MONOABS 0.7 04/24/2024 1409   EOSABS 0.2 04/24/2024 1409   BASOSABS 0.1 04/24/2024 1409    BMET    Component Value Date/Time   NA 137 08/06/2024 0356   K 4.1 08/06/2024 0356   K 3.6 10/31/2011 0614   CL 100 08/06/2024 0356   CO2 26 08/06/2024 0356   GLUCOSE 120 (H) 08/06/2024 0356   BUN 62 (H) 08/06/2024 0356   CREATININE 3.61 (H) 08/06/2024 0356   CREATININE 3.00 (H) 04/24/2024 1409   CALCIUM  7.7 (L)  08/06/2024 0356   GFRNONAA 17 (L) 08/06/2024 0356   GFRNONAA 21 (L) 04/24/2024 1409   GFRAA >60 12/13/2017 0716    INR    Component Value Date/Time   INR 1.1 07/29/2024 0800     Intake/Output Summary (Last 24 hours) at 08/06/2024 0733 Last data filed at 08/06/2024 0400 Gross per 24 hour  Intake 960 ml  Output 1200 ml  Net -240 ml     Assessment/Plan:  73 y.o. adult is s/p SEE ABOVE  4 Days Post-Op   PLAN  Vascular Surgery was consulted to place a Dialysis Perma catheter for outpatient hemodialysis. Vascular surgery plans on taking the patient to the vascular lab on Wednesday 08/07/24 for dialysis permacatheter placement.  I discussed in detail today at the bedside with the patient the procedure, benefits, risk, and complications.  Patient verbalizes understanding and wishes to proceed.  Patient will remain n.p.o. after midnight the day of his procedure.  Patient's last noted BUN and creatinine are 62 and 3.61.  Patient's last CBC on 08/03/2024 as patient's hemoglobin at 9.2 and hematocrit of 29.5 and platelets at 214.  Patient is currently getting heparin  5000 units subcu every 12 hour, and this will be held for the procedure tomorrow.  I discussed the case in detail with Dr. Selinda Gu MD and he agrees with the plan.  DVT prophylaxis: Heparin  5000 units subcutaneous every 12 hours   [name] [facility] 08/06/2024 7:33 AM

## 2024-08-06 NOTE — Progress Notes (Signed)
 [facility]  ROUNDING NOTE   Subjective:   Eric Browning is a 73 y.o. male with past medical history of diabetes, peripheral vascular disease, hypertension, congestive heart failure, hyperlipidemia, gout, BPH and chronic kidney disease with anemia and proteinuria. Patient presents to ED with shortness of breath and has been admitted for CHF (congestive heart failure) (HCC) [I50.9] SOB (shortness of breath) [R06.02] Hypoxia [R09.02] Acute on chronic systolic congestive heart failure (HCC) [I50.23] Acute respiratory failure with hypoxia and hypercarbia (HCC) [J96.01, J96.02]  Patient is known to our practice and was seen by Dr Dennise in October.   Update Patient seen sitting at side of  Completed breakfast tray at bedside Denies pain Room air, denies shortness of breath   Objective:  Vital signs in last 24 hours:  Temp:  [97.5 F (36.4 C)-98.3 F (36.8 C)] 97.7 F (36.5 C) (12/09 1152) Pulse Rate:  [54-82] 64 (12/09 1152) Resp:  [16-21] 21 (12/09 1152) BP: (108-156)/(54-91) 108/59 (12/09 1152) SpO2:  [95 %-100 %] 96 % (12/09 1152) Weight:  [87.7 kg-92.1 kg] 92.1 kg (12/09 0500)  Weight change: -1.2 kg Filed Weights   08/05/24 0431 08/05/24 1800 08/06/24 0500  Weight: 88.9 kg 87.7 kg 92.1 kg    Intake/Output: I/O last 3 completed shifts: In: 2040 [P.O.:2040] Out: 1200 [Urine:200; Other:1000]   Intake/Output this shift:  Total I/O In: 240 [P.O.:240] Out: -   Physical Exam: General: NAD  Head: Normocephalic, atraumatic. Moist oral mucosal membranes  Eyes: Anicteric  Lungs:  Clear to auscultation, normal effort  Heart: Regular rate and rhythm  Abdomen:  Soft, nontender  Extremities:  1+ peripheral edema.  Neurologic: Awake, alert, conversant  Skin: Warm,dry, no rash  Access Rt femoral tempcath    Basic Metabolic Panel: Recent Labs  Lab 08/01/24 0410 08/02/24 0908 08/03/24 0853 08/05/24 0407 08/06/24 0356  NA 136 137 137 138 137  K 4.6 4.7 4.6  4.5 4.1  CL 99 101 100 101 100  CO2 24 21* 25 25 26   GLUCOSE 164* 122* 99 120* 120*  BUN 96* 118* 107* 102* 62*  CREATININE 4.96* 5.55* 5.35* 5.42* 3.61*  CALCIUM  7.9* 7.8* 7.5* 8.2* 7.7*  MG 2.8*  --   --   --   --   PHOS  --  7.6* 7.7*  --   --     Liver Function Tests: Recent Labs  Lab 08/02/24 0908 08/03/24 0853  ALBUMIN 3.7 3.3*   No results for input(s): LIPASE, AMYLASE in the last 168 hours. No results for input(s): AMMONIA in the last 168 hours.  CBC: Recent Labs  Lab 07/31/24 0406 08/01/24 0410 08/02/24 1531 08/03/24 0853  WBC 13.8* 12.0* 9.3 10.0  HGB 9.2* 9.2* 9.0* 9.2*  HCT 29.3* 29.9* 28.7* 29.5*  MCV 85.2 84.9 84.4 85.5  PLT 311 280 234 214    Cardiac Enzymes: No results for input(s): CKTOTAL, CKMB, CKMBINDEX, TROPONINI in the last 168 hours.  BNP: Invalid input(s): POCBNP  CBG: Recent Labs  Lab 08/05/24 1242 08/05/24 1828 08/05/24 2126 08/06/24 0825 08/06/24 1148  GLUCAP 109* 125* 169* 100* 85    Microbiology: Results for orders placed or performed during the hospital encounter of 07/29/24  Resp panel by RT-PCR (RSV, Flu A&B, Covid) Anterior Nasal Swab     Status: None   Collection Time: 07/29/24  7:57 AM   Specimen: Anterior Nasal Swab  Result Value Ref Range Status   SARS Coronavirus 2 by RT PCR NEGATIVE NEGATIVE Final  Comment: (NOTE) SARS-CoV-2 target nucleic acids are NOT DETECTED.  The SARS-CoV-2 RNA is generally detectable in upper respiratory specimens during the acute phase of infection. The lowest concentration of SARS-CoV-2 viral copies this assay can detect is 138 copies/mL. A negative result does not preclude SARS-Cov-2 infection and should not be used as the sole basis for treatment or other patient management decisions. A negative result may occur with  improper specimen collection/handling, submission of specimen other than nasopharyngeal swab, presence of viral mutation(s) within the areas targeted  by this assay, and inadequate number of viral copies(<138 copies/mL). A negative result must be combined with clinical observations, patient history, and epidemiological information. The expected result is Negative.  Fact Sheet for Patients:  bloggercourse.com  Fact Sheet for Healthcare Providers:  seriousbroker.it  This test is no t yet approved or cleared by the [place]  FDA and  has been authorized for detection and/or diagnosis of SARS-CoV-2 by FDA under an Emergency Use Authorization (EUA). This EUA will remain  in effect (meaning this test can be used) for the duration of the COVID-19 declaration under Section 564(b)(1) of the Act, 21 U.S.C.section 360bbb-3(b)(1), unless the authorization is terminated  or revoked sooner.       Influenza A by PCR NEGATIVE NEGATIVE Final   Influenza B by PCR NEGATIVE NEGATIVE Final    Comment: (NOTE) The Xpert Xpress SARS-CoV-2/FLU/RSV plus assay is intended as an aid in the diagnosis of influenza from Nasopharyngeal swab specimens and should not be used as a sole basis for treatment. Nasal washings and aspirates are unacceptable for Xpert Xpress SARS-CoV-2/FLU/RSV testing.  Fact Sheet for Patients: bloggercourse.com  Fact Sheet for Healthcare Providers: seriousbroker.it  This test is not yet approved or cleared by the [place]  FDA and has been authorized for detection and/or diagnosis of SARS-CoV-2 by FDA under an Emergency Use Authorization (EUA). This EUA will remain in effect (meaning this test can be used) for the duration of the COVID-19 declaration under Section 564(b)(1) of the Act, 21 U.S.C. section 360bbb-3(b)(1), unless the authorization is terminated or revoked.     Resp Syncytial Virus by PCR NEGATIVE NEGATIVE Final    Comment: (NOTE) Fact Sheet for Patients: bloggercourse.com  Fact  Sheet for Healthcare Providers: seriousbroker.it  This test is not yet approved or cleared by the [place]  FDA and has been authorized for detection and/or diagnosis of SARS-CoV-2 by FDA under an Emergency Use Authorization (EUA). This EUA will remain in effect (meaning this test can be used) for the duration of the COVID-19 declaration under Section 564(b)(1) of the Act, 21 U.S.C. section 360bbb-3(b)(1), unless the authorization is terminated or revoked.  Performed at [facility], [address] [place], [place] [address]     Coagulation Studies: No results for input(s): LABPROT, INR in the last 72 hours.   Urinalysis: No results for input(s): COLORURINE, LABSPEC, PHURINE, GLUCOSEU, HGBUR, BILIRUBINUR, KETONESUR, PROTEINUR, UROBILINOGEN, NITRITE, LEUKOCYTESUR in the last 72 hours.  Invalid input(s): APPERANCEUR    Imaging: No results found.    Medications:      amLODipine   5 mg Oral Daily   aspirin  EC  81 mg Oral Daily   carvedilol   12.5 mg Oral BID   Chlorhexidine  Gluconate Cloth  6 each Topical Q0600   epoetin  alfa-epbx (RETACRIT ) injection  4,000 Units Intravenous Q M,W,F-1800   ezetimibe   10 mg Oral Daily   fluticasone  furoate-vilanterol  1 puff Inhalation Daily   guaiFENesin   1,200 mg Oral BID   heparin   5,000 Units Subcutaneous Q12H   hydrALAZINE   25 mg Oral Q8H   insulin  aspart  0-9 Units Subcutaneous TID WC   isosorbide  mononitrate  30 mg Oral Daily   pravastatin   20 mg Oral QHS   predniSONE   30 mg Oral Q breakfast   tamsulosin   0.4 mg Oral Daily   torsemide   40 mg Oral Daily   acetaminophen , albuterol , lidocaine  (PF), ondansetron  (ZOFRAN ) IV, mouth rinse  Assessment/ Plan:  [name] is a 72 y.o.  adult with past medical history of diabetes, peripheral vascular disease, hypertension, congestive heart failure, hyperlipidemia, gout, BPH and chronic kidney disease with anemia and  proteinuria. Patient presents to ED with shortness of breath and has been admitted for CHF (congestive heart failure) (HCC) [I50.9] SOB (shortness of breath) [R06.02] Hypoxia [R09.02] Acute on chronic systolic congestive heart failure (HCC) [I50.23] Acute respiratory failure with hypoxia and hypercarbia (HCC) [J96.01, J96.02]   Acute Kidney Injury on chronic kidney disease stage 4 with baseline creatinine 3.0 and GFR of 21 on 07/03/2024.  Acute kidney injury secondary to volume overload with IV diuresis. Creatinine baseline on Ed arrival, now 4.2. Was given IV furosemide  for diuresis. Now held.   Due to persistent renal decline, patient agreeable to proceed with dialysis. Appreciate vascular surgery placing HD temp cath. First dialysis treatment received on 12/5.   Dialysis received yesterday, 1L fluid removal achieved. Next treatment scheduled for Wednesday. Outpatient patient clinic confirmed at [facility] on a MWF, can start on Friday. Vascular plans to place tunneled access on Wednesday.    Lab Results  Component Value Date   CREATININE 3.61 (H) 08/06/2024   CREATININE 5.42 (H) 08/05/2024   CREATININE 5.35 (H) 08/03/2024    Intake/Output Summary (Last 24 hours) at 08/06/2024 1223 Last data filed at 08/06/2024 0900 Gross per 24 hour  Intake 960 ml  Output 1000 ml  Net -40 ml   2. Chronic systolic heart failure , ECHO completed on 06/07/24 shows EF 40%. Prescribed Torsemide  40mg  twice daily, then decreased to daily, per patient. Diuretics held.  Will order Torsemide  40mg  daily.   3. Anemia of chronic kidney disease  Lab Results  Component Value Date   HGB 9.2 (L) 08/03/2024    Continue low dose retacrit  with dialysis.   4. Hypertension with chronic kidney disease. Receiving amlodipine , carvedilol , hydralazine , and isosorbide . Blood pressure soft but stable   LOS: 8 Izola Teague 12/9/202512:23 PM

## 2024-08-06 NOTE — NC FL2 (Addendum)
 [place]  MEDICAID FL2 LEVEL OF CARE FORM     IDENTIFICATION  Patient Name: Eric Browning Birthdate: 1951/04/11 Sex: adult Admission Date (Current Location): 07/29/2024  [facility] and [place] Number:  Chiropodist and Address:  [facility], [address], [place], [place] [address]      Provider Number: [id number]  Attending Physician Name and Address:  Trudy Anthony HERO, MD  Relative Name and Phone Number:       Current Level of Care: Hospital Recommended Level of Care: Skilled Nursing Facility Prior Approval Number:    Date Approved/Denied:   PASRR Number: [id number]  Discharge Plan: SNF    Current Diagnoses: Patient Active Problem List   Diagnosis Date Noted   Encounter for dialysis and dialysis catheter care 08/02/2024   ESRD (end stage renal disease) (HCC) 08/02/2024   AKI (acute kidney injury) 07/30/2024   COPD with acute exacerbation (HCC) 07/30/2024   Acute on chronic systolic CHF (congestive heart failure) (HCC) 07/29/2024   CHF (congestive heart failure) (HCC) 07/29/2024   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 12/31/2020   Essential hypertension 05/14/2018   Obesity 05/14/2018   Primary male hypogonadism 05/14/2018   Pure hypercholesterolemia 05/14/2018   Type 2 diabetes mellitus without complication, without long-term current use of insulin  (HCC) 05/14/2018   Status post reverse total shoulder replacement, 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 Has home CPAP. Continent Weight: 203 lb 1.6 oz (92.1 kg) Height:  5' 7 (170.2 cm)  BEHAVIORAL SYMPTOMS/MOOD NEUROLOGICAL BOWEL  NUTRITION STATUS   (None)  (None) Continent Diet  AMBULATORY STATUS COMMUNICATION OF NEEDS Skin   Extensive Assist Verbally Normal                       Personal Care Assistance Level of Assistance  Bathing, Dressing, Feeding Bathing Assistance: Limited assistance Feeding assistance: Limited assistance Dressing Assistance: Limited assistance     Functional Limitations Info  Sight, Hearing, Speech Sight Info: Adequate Hearing Info: Adequate Speech Info: Adequate    SPECIAL CARE FACTORS FREQUENCY  PT (By licensed PT), OT (By licensed OT)     PT Frequency: 5 x week OT Frequency: 5 x week            Contractures Contractures Info: Not present    Additional Factors Info  Code Status, Allergies Code Status Info: Full code Allergies Info: NKDA           Current Medications (08/06/2024):  This is the current hospital active medication list Current Facility-Administered Medications  Medication Dose Route Frequency Provider Last Rate Last Admin   acetaminophen  (TYLENOL ) tablet 650 mg  650 mg Oral Q4H PRN Laurita Manor T, MD   650 mg at 07/29/24 2256   albuterol  (PROVENTIL ) (2.5 MG/3ML) 0.083% nebulizer solution 2.5 mg  2.5 mg Inhalation Q6H PRN Laurita Manor T, MD   2.5 mg at 08/04/24 2231   amLODipine  (NORVASC ) tablet 5 mg  5 mg Oral Daily Cox, Amy N, DO   5 mg at 08/06/24 0916   aspirin  EC tablet 81 mg  81 mg Oral Daily Laurita Manor DASEN, MD  81 mg at 08/06/24 0916   carvedilol  (COREG ) tablet 12.5 mg  12.5 mg Oral BID Laurita Manor T, MD   12.5 mg at 08/06/24 9083   Chlorhexidine  Gluconate Cloth 2 % PADS 6 each  6 each Topical Q0600 Druscilla Bald, NP   6 each at 08/06/24 0616   epoetin  alfa-epbx (RETACRIT ) injection 4,000 Units  4,000 Units Intravenous Q M,W,F-1800 Breeze, Bald, NP   4,000 Units at 08/05/24 1740   ezetimibe  (ZETIA ) tablet 10 mg  10 mg Oral Daily Laurita Manor T, MD   10 mg at 08/06/24 9083   fluticasone  furoate-vilanterol (BREO ELLIPTA ) 200-25 MCG/ACT 1  puff  1 puff Inhalation Daily Laurita Manor T, MD   1 puff at 08/06/24 9076   guaiFENesin  (MUCINEX ) 12 hr tablet 1,200 mg  1,200 mg Oral BID Laurita Manor T, MD   1,200 mg at 08/06/24 0916   heparin  injection 5,000 Units  5,000 Units Subcutaneous Q12H Laurita Manor T, MD   5,000 Units at 08/06/24 9082   hydrALAZINE  (APRESOLINE ) tablet 25 mg  25 mg Oral Q8H Laurita Manor T, MD   25 mg at 08/06/24 9383   insulin  aspart (novoLOG ) injection 0-9 Units  0-9 Units Subcutaneous TID WC Laurita Manor DASEN, MD   1 Units at 08/05/24 9075   isosorbide  mononitrate (IMDUR ) 24 hr tablet 30 mg  30 mg Oral Daily Zhang, Ping T, MD   30 mg at 08/06/24 9083   lidocaine  (PF) (XYLOCAINE ) 1 % injection    PRN Jama Cordella MATSU, MD   20 mL at 08/02/24 1448   ondansetron  (ZOFRAN ) injection 4 mg  4 mg Intravenous Q6H PRN Laurita Manor DASEN, MD       Oral care mouth rinse  15 mL Mouth Rinse PRN Cleatus Delayne GAILS, MD       pravastatin  (PRAVACHOL ) tablet 20 mg  20 mg Oral QHS Laurita Manor T, MD   20 mg at 08/05/24 2152   predniSONE  (DELTASONE ) tablet 30 mg  30 mg Oral Q breakfast Trudy Anthony HERO, MD   30 mg at 08/06/24 9076   tamsulosin  (FLOMAX ) capsule 0.4 mg  0.4 mg Oral Daily Laurita Manor T, MD   0.4 mg at 08/06/24 9083   torsemide  (DEMADEX ) tablet 40 mg  40 mg Oral Daily Druscilla Bald, NP   40 mg at 08/06/24 1009     Discharge Medications: Please see discharge summary for a list of discharge medications.  Relevant Imaging Results:  Relevant Lab Results:   Additional Information SS#: [id number]. New HD. Dialysis coordinator is working on finding a dialysis chair.  Lauraine JAYSON Carpen, LCSW

## 2024-08-06 NOTE — Progress Notes (Signed)
 PT Cancellation Note  Patient Details Name: Eric Browning MRN: [id number] DOB: [birth year]   Cancelled Treatment:     PT attempt. Pt refused at this time. I just got back from walking to the BR and now am just getting comfortable in bed. Author will return later and continue to follow + progress per current POC.    Rankin KATHEE Essex 08/06/2024, 11:26 AM

## 2024-08-06 NOTE — Progress Notes (Signed)
 Pt accepted at [facility] MWF at 7:15am. Pt can start 08/09/24 and will need to arrive at  6:30am for new pt paperwork.                    SABRA Suzen Satchel Dialysis Navigator [phone]

## 2024-08-07 ENCOUNTER — Encounter: Payer: Self-pay | Admitting: Vascular Surgery

## 2024-08-07 ENCOUNTER — Encounter
Admission: EM | Disposition: A | Discharge status: Skilled Nursing Facility | Payer: Self-pay | Source: Home / Self Care | Attending: Internal Medicine

## 2024-08-07 HISTORY — PX: DIALYSIS/PERMA CATHETER INSERTION: CATH118288

## 2024-08-07 LAB — GLUCOSE, CAPILLARY
Glucose-Capillary: 83 mg/dL (ref 70–99)
Glucose-Capillary: 90 mg/dL (ref 70–99)
Glucose-Capillary: 115 mg/dL — ABNORMAL HIGH (ref 70–99)
Glucose-Capillary: 129 mg/dL — ABNORMAL HIGH (ref 70–99)
Glucose-Capillary: 141 mg/dL — ABNORMAL HIGH (ref 70–99)

## 2024-08-07 LAB — BASIC METABOLIC PANEL WITH GFR
Anion gap: 11 (ref 5–15)
BUN: 78 mg/dL — ABNORMAL HIGH (ref 8–23)
CO2: 26 mmol/L (ref 22–32)
Calcium: 8 mg/dL — ABNORMAL LOW (ref 8.9–10.3)
Chloride: 100 mmol/L (ref 98–111)
Creatinine, Ser: 4.37 mg/dL — ABNORMAL HIGH (ref 0.61–1.24)
GFR, Estimated: 13 mL/min — ABNORMAL LOW (ref >60)
Glucose, Bld: 85 mg/dL (ref 70–99)
Potassium: 4 mmol/L (ref 3.5–5.1)
Sodium: 137 mmol/L (ref 135–145)

## 2024-08-07 SURGERY — DIALYSIS/PERMA CATHETER INSERTION
Anesthesia: Moderate Sedation

## 2024-08-07 MED ORDER — HEPARIN SODIUM (PORCINE) 10000 UNIT/ML IJ SOLN
INTRAMUSCULAR | Status: DC | PRN
  Administered 2024-08-07: 10000 [IU]

## 2024-08-07 MED ORDER — CEFAZOLIN SODIUM-DEXTROSE 1-4 GM/50ML-% IV SOLN
1.0000 g | INTRAVENOUS | Status: AC
  Administered 2024-08-07: 1 g via INTRAVENOUS
  Filled 2024-08-07: qty 50

## 2024-08-07 MED ORDER — HEPARIN SODIUM (PORCINE) 1000 UNIT/ML IJ SOLN
INTRAMUSCULAR | Status: AC
  Filled 2024-08-07: qty 6

## 2024-08-07 MED ORDER — HEPARIN (PORCINE) IN NACL 1000-0.9 UT/500ML-% IV SOLN
INTRAVENOUS | Status: DC | PRN
  Administered 2024-08-07: 500 mL

## 2024-08-07 MED ORDER — SODIUM CHLORIDE 0.9 % IV SOLN: INTRAVENOUS | Status: DC

## 2024-08-07 MED ORDER — DIPHENHYDRAMINE HCL 50 MG/ML IJ SOLN: 50.0000 mg | Freq: Once | INTRAMUSCULAR | Status: DC | PRN

## 2024-08-07 MED ORDER — CEFAZOLIN SODIUM-DEXTROSE 1-4 GM/50ML-% IV SOLN
INTRAVENOUS | Status: AC
  Filled 2024-08-07: qty 50

## 2024-08-07 MED ORDER — FAMOTIDINE 20 MG PO TABS: 40.0000 mg | ORAL_TABLET | Freq: Once | ORAL | Status: DC | PRN

## 2024-08-07 MED ORDER — MIDAZOLAM HCL 2 MG/ML PO SYRP
8.0000 mg | ORAL_SOLUTION | Freq: Once | ORAL | Status: DC | PRN
  Filled 2024-08-07: qty 5

## 2024-08-07 MED ORDER — PROSOURCE PLUS PO LIQD
30.0000 mL | Freq: Two times a day (BID) | ORAL | Status: DC
  Administered 2024-08-08 – 2024-08-09 (×3): 30 mL via ORAL

## 2024-08-07 MED ORDER — MIDAZOLAM HCL 2 MG/2ML IJ SOLN
INTRAMUSCULAR | Status: AC
  Filled 2024-08-07: qty 2

## 2024-08-07 MED ORDER — FENTANYL CITRATE (PF) 100 MCG/2ML IJ SOLN
INTRAMUSCULAR | Status: AC
  Filled 2024-08-07: qty 2

## 2024-08-07 MED ORDER — ALTEPLASE 2 MG IJ SOLR: 2.0000 mg | Freq: Once | INTRAMUSCULAR | Status: DC | PRN

## 2024-08-07 MED ORDER — MIDAZOLAM HCL (PF) 2 MG/2ML IJ SOLN
INTRAMUSCULAR | Status: DC | PRN
  Administered 2024-08-07: 1 mg via INTRAVENOUS

## 2024-08-07 MED ORDER — FENTANYL CITRATE (PF) 100 MCG/2ML IJ SOLN
INTRAMUSCULAR | Status: DC | PRN
  Administered 2024-08-07: 50 ug via INTRAVENOUS

## 2024-08-07 MED ORDER — HEPARIN SODIUM (PORCINE) 1000 UNIT/ML DIALYSIS
1000.0000 [IU] | INTRAMUSCULAR | Status: DC | PRN
  Administered 2024-08-07 (×5): 1000 [IU]
  Filled 2024-08-07 (×5): qty 1

## 2024-08-07 MED ORDER — EPOETIN ALFA 4000 UNIT/ML IJ SOLN
INTRAMUSCULAR | Status: AC
  Filled 2024-08-07: qty 1

## 2024-08-07 MED ORDER — LIDOCAINE-EPINEPHRINE (PF) 1 %-1:200000 IJ SOLN
INTRAMUSCULAR | Status: DC | PRN
  Administered 2024-08-07: 20 mL

## 2024-08-07 MED ORDER — RENA-VITE PO TABS
1.0000 | ORAL_TABLET | Freq: Every day | ORAL | Status: DC
  Administered 2024-08-07 – 2024-08-08 (×2): 1 via ORAL
  Filled 2024-08-07 (×2): qty 1

## 2024-08-07 MED ORDER — METHYLPREDNISOLONE SODIUM SUCC 125 MG IJ SOLR: 125.0000 mg | Freq: Once | INTRAMUSCULAR | Status: DC | PRN

## 2024-08-07 SURGICAL SUPPLY — 6 items
CATH HEMO 15FR 19 SMART SEAL (HEMODIALYSIS SUPPLIES) IMPLANT
COVER PROBE ULTRASOUND 5X96 (MISCELLANEOUS) IMPLANT
DERMABOND ADVANCED .7 DNX12 (GAUZE/BANDAGES/DRESSINGS) IMPLANT
PACK ANGIOGRAPHY (CUSTOM PROCEDURE TRAY) ×1 IMPLANT
SUT MNCRL AB 4-0 PS2 18 (SUTURE) IMPLANT
SUT PROLENE 0 CT 1 30 (SUTURE) IMPLANT

## 2024-08-07 NOTE — Progress Notes (Signed)
 PROGRESS NOTE   HPI was taken from Dr. Laurita:  Eric Browning is a 73 y.o. adult with medical history significant of CAD status post PCI and stenting, chronic HFrEF with LVEF 44%, severe pulmonary hypertension, COPD Gold stage IV, CKD stage IV, OSA on CPAP at bedtime, presented with worsening of cough wheezing shortness of breath.   Symptoms started 9 to 10 days ago when patient started to have increasing exertional dyspnea, cough with occasional clear phlegm, and wheezing.  Last several days, even walking inside his home bedroom causing significant shortness of breath.  For which patient has been using his nocturnal CPAP at daytime to compensate with some relief.  He however did not increase his torsemide  dosage.  He denied any chest pain, no fever or chills.  Over the weekend he has also had decreased appetite but denied any nauseous vomiting, no abdominal pain or diarrhea.  Denied any urinary symptoms.   ED Course: Afebrile, heart rate 80s, blood pressure 180/90 O2 saturation 89% on 2 L.  O2 saturation 98% on BiPAP with FiO2 35%.  Chest x-ray showed pulmonary congestion bilaterally, blood work showed VBG 7.30/64/48, WBC 7.1 hemoglobin 10.5 BUN 36 creatinine 2.9 sodium 147 potassium 4.1 bicarb 28.   Patient was given IV Lasix  40 mg, IV Solu-Medrol , DuoNebs x 1 in the ED    [name]  FMW:[id number] DOB: 03/03/1951 DOA: 07/29/2024 PCP: Alla Amis, MD   Assessment & Plan:   Principal Problem:   CHF (congestive heart failure) (HCC) Active Problems:   AKI (acute kidney injury)   COPD with acute exacerbation (HCC)   Essential hypertension   Symptomatic anemia   Primary male hypogonadism   Pure hypercholesterolemia   Type 2 diabetes mellitus without complication, without long-term current use of insulin  (HCC)   Personal history of gout   Encounter for dialysis and dialysis catheter care   ESRD (end stage renal disease) (HCC)  Assessment and Plan: Acute on chronic systolic CHF  exacerbation: holding lasix  as pt is now on HD. Continue on coreg . Monitor I/Os   COPD exacerbation: continue on steroid taper, bronchodilators & encourage incentive spirometry  AKI on CKDIV: started on HD on 08/02/24 as per nephro. S/p placement of tunneled HD cath 08/07/24 as per vasc surg . Still needs an outpatient HD clinic/time. Nephro following and recs apprec   HTN: continue on coreg , imdur , amlodipine , hydralazine     DM2: likely poorly controlled. Continue on SSI w/ accuchecks   HLD: continue on statin, zetia    Hx of gout: holding home allopurinol  secondary to AKI on CKDIV currently requiring HD    Obesity: BMI 32.5. Would benefit from weight loss    DVT prophylaxis: heparin   Code Status: full  Family Communication: Disposition Plan: likely d/c to SNF  Status is: Inpatient Remains inpatient appropriate because: can d/c home w/ HH vs outpatient therapy once outpatient HD clinic is set up    Level of care: Progressive Consultants:  Nephro   Procedures:   Antimicrobials:   Subjective: Pt c/o malaise  Objective: Vitals:   08/07/24 1100 08/07/24 1115 08/07/24 1130 08/07/24 1158  BP: 124/73 125/73 120/72 131/67  Pulse: 71 73 74 73  Resp: 15 19 17 19   Temp: (!) 96.6 F (35.9 C)   (!) 97.4 F (36.3 C)  TempSrc: Temporal     SpO2: 97% 98% 96% 98%  Weight:      Height:        Intake/Output Summary (Last 24 hours) at 08/07/2024 1235 Last  data filed at 08/07/2024 1154 Gross per 24 hour  Intake 650 ml  Output 500 ml  Net 150 ml   Filed Weights   08/05/24 1800 08/06/24 0500 08/07/24 0500  Weight: 87.7 kg 92.1 kg 93.7 kg    Examination:  General exam: Appears comfortable Respiratory system: diminished breath sounds b/l  Cardiovascular system: S1 & S2+. Gastrointestinal system: abd is soft, NT, obese & hypoactive bowel sounds Central nervous system: alert & oriented. Moves all extremities Psychiatry: judgement and insight appears at baseline. Flat mood  and affect    Data Reviewed: I have personally reviewed following labs and imaging studies  CBC: Recent Labs  Lab 08/01/24 0410 08/02/24 1531 08/03/24 0853  WBC 12.0* 9.3 10.0  HGB 9.2* 9.0* 9.2*  HCT 29.9* 28.7* 29.5*  MCV 84.9 84.4 85.5  PLT 280 234 214   Basic Metabolic Panel: Recent Labs  Lab 08/01/24 0410 08/02/24 0908 08/03/24 0853 08/05/24 0407 08/06/24 0356 08/07/24 0512  NA 136 137 137 138 137 137  K 4.6 4.7 4.6 4.5 4.1 4.0  CL 99 101 100 101 100 100  CO2 24 21* 25 25 26 26   GLUCOSE 164* 122* 99 120* 120* 85  BUN 96* 118* 107* 102* 62* 78*  CREATININE 4.96* 5.55* 5.35* 5.42* 3.61* 4.37*  CALCIUM  7.9* 7.8* 7.5* 8.2* 7.7* 8.0*  MG 2.8*  --   --   --   --   --   PHOS  --  7.6* 7.7*  --   --   --    GFR: Estimated Creatinine Clearance (by C-G formula based on SCr of 4.37 mg/dL (H)) Male: 86.8 mL/min (A) Male: 15.9 mL/min (A) Liver Function Tests: Recent Labs  Lab 08/02/24 0908 08/03/24 0853  ALBUMIN 3.7 3.3*   No results for input(s): LIPASE, AMYLASE in the last 168 hours. No results for input(s): AMMONIA in the last 168 hours. Coagulation Profile: No results for input(s): INR, PROTIME in the last 168 hours.  Cardiac Enzymes: No results for input(s): CKTOTAL, CKMB, CKMBINDEX, TROPONINI in the last 168 hours. BNP (last 3 results) Recent Labs    07/29/24 0800  PROBNP 7,322.0*   HbA1C: No results for input(s): HGBA1C in the last 72 hours. CBG: Recent Labs  Lab 08/06/24 1148 08/06/24 1646 08/07/24 0732 08/07/24 1002 08/07/24 1153  GLUCAP 85 94 83 90 115*   Lipid Profile: No results for input(s): CHOL, HDL, LDLCALC, TRIG, CHOLHDL, LDLDIRECT in the last 72 hours. Thyroid Function Tests: No results for input(s): TSH, T4TOTAL, FREET4, T3FREE, THYROIDAB in the last 72 hours. Anemia Panel: No results for input(s): VITAMINB12, FOLATE, FERRITIN, TIBC, IRON, RETICCTPCT in the last 72  hours. Sepsis Labs: No results for input(s): PROCALCITON, LATICACIDVEN in the last 168 hours.  Recent Results (from the past 240 hours)  Resp panel by RT-PCR (RSV, Flu A&B, Covid) Anterior Nasal Swab     Status: None   Collection Time: 07/29/24  7:57 AM   Specimen: Anterior Nasal Swab  Result Value Ref Range Status   SARS Coronavirus 2 by RT PCR NEGATIVE NEGATIVE Final    Comment: (NOTE) SARS-CoV-2 target nucleic acids are NOT DETECTED.  The SARS-CoV-2 RNA is generally detectable in upper respiratory specimens during the acute phase of infection. The lowest concentration of SARS-CoV-2 viral copies this assay can detect is 138 copies/mL. A negative result does not preclude SARS-Cov-2 infection and should not be used as the sole basis for treatment or other patient management decisions. A negative result may occur  with  improper specimen collection/handling, submission of specimen other than nasopharyngeal swab, presence of viral mutation(s) within the areas targeted by this assay, and inadequate number of viral copies(<138 copies/mL). A negative result must be combined with clinical observations, patient history, and epidemiological information. The expected result is Negative.  Fact Sheet for Patients:  bloggercourse.com  Fact Sheet for Healthcare Providers:  seriousbroker.it  This test is no t yet approved or cleared by the [place]  FDA and  has been authorized for detection and/or diagnosis of SARS-CoV-2 by FDA under an Emergency Use Authorization (EUA). This EUA will remain  in effect (meaning this test can be used) for the duration of the COVID-19 declaration under Section 564(b)(1) of the Act, 21 U.S.C.section 360bbb-3(b)(1), unless the authorization is terminated  or revoked sooner.       Influenza A by PCR NEGATIVE NEGATIVE Final   Influenza B by PCR NEGATIVE NEGATIVE Final    Comment: (NOTE) The Xpert  Xpress SARS-CoV-2/FLU/RSV plus assay is intended as an aid in the diagnosis of influenza from Nasopharyngeal swab specimens and should not be used as a sole basis for treatment. Nasal washings and aspirates are unacceptable for Xpert Xpress SARS-CoV-2/FLU/RSV testing.  Fact Sheet for Patients: bloggercourse.com  Fact Sheet for Healthcare Providers: seriousbroker.it  This test is not yet approved or cleared by the [place]  FDA and has been authorized for detection and/or diagnosis of SARS-CoV-2 by FDA under an Emergency Use Authorization (EUA). This EUA will remain in effect (meaning this test can be used) for the duration of the COVID-19 declaration under Section 564(b)(1) of the Act, 21 U.S.C. section 360bbb-3(b)(1), unless the authorization is terminated or revoked.     Resp Syncytial Virus by PCR NEGATIVE NEGATIVE Final    Comment: (NOTE) Fact Sheet for Patients: bloggercourse.com  Fact Sheet for Healthcare Providers: seriousbroker.it  This test is not yet approved or cleared by the [place]  FDA and has been authorized for detection and/or diagnosis of SARS-CoV-2 by FDA under an Emergency Use Authorization (EUA). This EUA will remain in effect (meaning this test can be used) for the duration of the COVID-19 declaration under Section 564(b)(1) of the Act, 21 U.S.C. section 360bbb-3(b)(1), unless the authorization is terminated or revoked.  Performed at [facility], [address], [place], [place] [address]          Radiology Studies: PERIPHERAL VASCULAR CATHETERIZATION Result Date: 08/07/2024 See surgical note for result.        Scheduled Meds:  (feeding supplement) PROSource Plus  30 mL Oral BID BM   amLODipine   5 mg Oral Daily   aspirin  EC  81 mg Oral Daily   carvedilol   12.5 mg Oral BID   Chlorhexidine  Gluconate Cloth  6 each  Topical Q0600   epoetin  alfa-epbx (RETACRIT ) injection  4,000 Units Intravenous Q M,W,F-1800   ezetimibe   10 mg Oral Daily   fluticasone  furoate-vilanterol  1 puff Inhalation Daily   guaiFENesin   1,200 mg Oral BID   heparin   5,000 Units Subcutaneous Q12H   hydrALAZINE   25 mg Oral Q8H   insulin  aspart  0-9 Units Subcutaneous TID WC   isosorbide  mononitrate  30 mg Oral Daily   multivitamin  1 tablet Oral QHS   pravastatin   20 mg Oral QHS   predniSONE   20 mg Oral Q breakfast   tamsulosin   0.4 mg Oral Daily   torsemide   40 mg Oral Daily   Continuous Infusions:   LOS: 9 days  Anthony CHRISTELLA Pouch, MD [facility] Pager [phone]  If 7PM-7AM, please contact night-coverage [email or web address] 08/07/2024, 12:35 PM

## 2024-08-07 NOTE — Progress Notes (Signed)
°   08/07/24 1400  Spiritual Encounters  Type of Visit Initial  Care provided to: Patient  Conversation partners present during encounter Nurse  Referral source Other (comment) ([facility])  Reason for visit Routine spiritual support  OnCall Visit No  Spiritual Framework  Presenting Themes Values and beliefs;Impactful experiences and emotions;Rituals and practive  Community/Connection Family;Faith community  Patient Stress Factors Health changes  Family Stress Factors None identified  Interventions  Spiritual Care Interventions Made Prayer  Intervention Outcomes  Outcomes Connected to spiritual community;Awareness around self/spiritual resourses   Chaplain visited patient who requested prayer for his own health concerns, his brother's health concerns and his niece, who is the caregiver.  Patient shared that his wife died 8 months ago.  He misses her but said, Life goes on.  Chaplain offered a compassionate presence and prayer, at patient's request.    Rev. Rana M. Nicholaus, M.Div. Chaplain Resident [facility]

## 2024-08-07 NOTE — TOC Progression Note (Addendum)
 Transition of Care [facility]) - Progression Note    Patient Details  Name: Eric Browning MRN: [id number] Date of Birth: 08/07/1951  Transition of Care [facility]) CM/SW Contact  Victory Jackquline RAMAN, RN Phone Number: 08/07/2024, 12:21 PM  Clinical Narrative:    10:00 am: RNCM went to patient's room to meet with her, and he was in HD. Will go back to see  2:45 pm: RNCM went to patient's room to meet with her, and he was still in HD. RNCM will continue to follow for discharge planning needs.     Expected Discharge Plan: Home/Self Care Barriers to Discharge: Continued Medical Work up               Expected Discharge Plan and Services                                               Social Drivers of Health (SDOH) Interventions SDOH Screenings   Food Insecurity: No Food Insecurity (07/29/2024)  Housing: Unknown (07/29/2024)  Transportation Needs: No Transportation Needs (07/29/2024)  Utilities: Not At Risk (07/29/2024)  Depression (PHQ2-9): Low Risk  (04/24/2024)  Financial Resource Strain: Low Risk  (07/10/2024)   Received from [facility]  Social Connections: Unknown (07/29/2024)  Tobacco Use: Medium Risk (07/30/2024)    Readmission Risk Interventions     No data to display

## 2024-08-07 NOTE — Discharge Instructions (Signed)
 SABRA

## 2024-08-07 NOTE — Interval H&P Note (Signed)
 History and Physical Interval Note:  08/07/2024 10:14 AM  Eric Browning  has presented today for surgery, with the diagnosis of ESRD.  The various methods of treatment have been discussed with the patient and family. After consideration of risks, benefits and other options for treatment, the patient has consented to  Procedure(s): DIALYSIS/PERMA CATHETER INSERTION (N/A) as a surgical intervention.  The patient's history has been reviewed, patient examined, no change in status, stable for surgery.  I have reviewed the patient's chart and labs.  Questions were answered to the patient's satisfaction.     Zahlia Deshazer

## 2024-08-07 NOTE — Progress Notes (Signed)
 Physical Therapy Treatment Patient Details Name: Eric Browning MRN: [id number] DOB: 03/20/1951 Today's Date: 08/07/2024   History of Present Illness Pt is a 73 y.o. adult with PMH of CHF, CAD status post PCI and stenting, chronic HFrEF, severe pulmonary hypertension, COPD Gold stage IV, CKD stage IV, OSA. MD assessment i.ncludes: acute on chronic congestive heart failure with reduced ejection fraction, s/p temp hemodialysis femoral cath R LE    PT Comments  Pt was long sitting in bed upon arrival. He is A and O x3. Agreeable to session and remains cooperative. NPO for perm cath placement. Limited gait distance due to temp fem cath. He was safely able top ambulate around bed to recliner but does endorse fatigue. Pt is not at his baseline abilities and will benefit from continued skilled PT to maximize his independence and safety with all ADLs. Pt continues to require physical assistance for all ADLs. DC recs remain appropriate. Acute PT will continue to follow and progress per current POC.    If plan is discharge home, recommend the following: A little help with walking and/or transfers;A little help with bathing/dressing/bathroom;Assistance with cooking/housework;Assistance with feeding;Assist for transportation;Help with stairs or ramp for entrance     Equipment Recommendations  Other (comment) (Defer to next level of care)       Precautions / Restrictions Precautions Precautions: Fall Recall of Precautions/Restrictions: Intact Precaution/Restrictions Comments: Pt has temporary femoral catheter. Restrictions Weight Bearing Restrictions Per Provider Order: No     Mobility  Bed Mobility Overal bed mobility: Needs Assistance Bed Mobility: Supine to Sit  Supine to sit: Min assist     Transfers Overall transfer level: Needs assistance Equipment used: Rolling walker (2 wheels) Transfers: Sit to/from Stand Sit to Stand: Contact guard assist  General transfer comment: CGA plus vcs for  technique and sequencing improvements    Ambulation/Gait Ambulation/Gait assistance: Contact guard assist Gait Distance (Feet): 12 Feet Assistive device: Rolling walker (2 wheels) Gait Pattern/deviations: Step-through pattern Gait velocity: decreased  General Gait Details: Pt ambulated around bed to recliner with increased time and cues for posture correction. no formal LOB. Seemed to tolerate well.     Balance Overall balance assessment: Needs assistance Sitting-balance support: Bilateral upper extremity supported Sitting balance-Leahy Scale: Fair     Standing balance support: Bilateral upper extremity supported, During functional activity, Reliant on assistive device for balance Standing balance-Leahy Scale: Fair     Hotel Manager: No apparent difficulties  Cognition Arousal: Alert Behavior During Therapy: WFL for tasks assessed/performed   PT - Cognitive impairments: No apparent impairments    PT - Cognition Comments: Pt is A and O x 4 Following commands: Intact      Cueing Cueing Techniques: Verbal cues, Tactile cues         Pertinent Vitals/Pain Pain Assessment Pain Assessment: No/denies pain     PT Goals (current goals can now be found in the care plan section) Acute Rehab PT Goals Patient Stated Goal: get back home Progress towards PT goals: Progressing toward goals    Frequency    Min 2X/week           Co-evaluation     PT goals addressed during session: Mobility/safety with mobility;Balance;Proper use of DME;Strengthening/ROM        AM-PAC PT 6 Clicks Mobility   Outcome Measure  Help needed turning from your back to your side while in a flat bed without using bedrails?: A Little Help needed moving from lying on your back to sitting on  the side of a flat bed without using bedrails?: A Little Help needed moving to and from a bed to a chair (including a wheelchair)?: A Little Help needed standing up from a  chair using your arms (e.g., wheelchair or bedside chair)?: A Little Help needed to walk in hospital room?: A Little Help needed climbing 3-5 steps with a railing? : A Lot 6 Click Score: 17    End of Session   Activity Tolerance: Patient tolerated treatment well Patient left: in bed;with call bell/phone within reach;with bed alarm set Nurse Communication: Mobility status PT Visit Diagnosis: Unsteadiness on feet (R26.81);Muscle weakness (generalized) (M62.81);Difficulty in walking, not elsewhere classified (R26.2)     Time: 9149-9093 PT Time Calculation (min) (ACUTE ONLY): 16 min  Charges:    $Therapeutic Activity: 8-22 mins PT General Charges $$ ACUTE PT VISIT: 1 Visit                     Rankin Essex PTA 08/07/24, 10:05 AM

## 2024-08-07 NOTE — Op Note (Signed)
 OPERATIVE NOTE    PRE-OPERATIVE DIAGNOSIS: 1. ESRD   POST-OPERATIVE DIAGNOSIS: same as above  PROCEDURE: Ultrasound guidance for vascular access to the right internal jugular vein Fluoroscopic guidance for placement of catheter Placement of a 19 cm tip to cuff tunneled hemodialysis catheter via the right internal jugular vein  SURGEON: Selinda Gu, MD  ANESTHESIA:  Local with Moderate conscious sedation for approximately 20 minutes using 1 mg of Versed  and 50 mcg of Fentanyl   ESTIMATED BLOOD LOSS: 5 cc  FLUORO TIME: less than one minute  CONTRAST: none  FINDING(S): 1.  Patent right internal jugular vein  SPECIMEN(S):  None  INDICATIONS:   Eric Browning is a 73 y.o.adult who presents with renal failure.  The patient needs long term dialysis access for their ESRD, and a Permcath is necessary.  Risks and benefits are discussed and informed consent is obtained.    DESCRIPTION: After obtaining full informed written consent, the patient was brought back to the vascular suited. The patient's right neck and chest were sterilely prepped and draped in a sterile surgical field was created. Moderate conscious sedation was administered during a face to face encounter with the patient throughout the procedure with my supervision of the RN administering medicines and monitoring the patient's vital signs, pulse oximetry, telemetry and mental status throughout from the start of the procedure until the patient was taken to the recovery room.  The right internal jugular vein was visualized with ultrasound and found to be patent. It was then accessed under direct ultrasound guidance and a permanent image was recorded. A wire was placed. After skin nick and dilatation, the peel-away sheath was placed over the wire. I then turned my attention to an area under the clavicle. Approximately 1-2 fingerbreadths below the clavicle a small counterincision was created and tunneled from the subclavicular incision to  the access site. Using fluoroscopic guidance, a 19 centimeter tip to cuff tunneled hemodialysis catheter was selected, and tunneled from the subclavicular incision to the access site. It was then placed through the peel-away sheath and the peel-away sheath was removed. Using fluoroscopic guidance the catheter tips were parked in the right atrium. The appropriate distal connectors were placed. It withdrew blood well and flushed easily with heparinized saline and a concentrated heparin  solution was then placed. It was secured to the chest wall with 2 Prolene sutures. The access incision was closed single 4-0 Monocryl. A 4-0 Monocryl pursestring suture was placed around the exit site. Sterile dressings were placed. The patient tolerated the procedure well and was taken to the recovery room in stable condition.  COMPLICATIONS: None  CONDITION: Stable  Selinda Gu, MD 08/07/2024 11:27 AM   This note was created with Dragon Medical transcription system. Any errors in dictation are purely unintentional.

## 2024-08-07 NOTE — Progress Notes (Signed)
 Patient dialyzed in bed for 3.5 hours via Right HD permcath(placed today) with 1 liter net fluid removal. Access reversed due to increased arterial and venous pressures, causing machine to alarm frequently. Max bfr at 300 throughout treatment.Heparin  bolus given at beginning of treatment for dialysis lines anticoagulation.   08/07/24 1745  Vitals  Temp 98.3 F (36.8 C)  Temp Source Oral  BP (!) 146/89  MAP (mmHg) 98  BP Location Right Arm  BP Method Automatic  Patient Position (if appropriate) Lying  Pulse Rate 83  Pulse Rate Source Monitor  ECG Heart Rate 82  Resp (!) 21  Weight 92.9 kg  Type of Weight Post-Dialysis  Oxygen Therapy  SpO2 100 %  O2 Device Nasal Cannula  O2 Flow Rate (L/min) 2 L/min  Patient Activity (if Appropriate) In bed  Pulse Oximetry Type Continuous  Oximetry Probe Site Changed No  During Treatment Monitoring  Blood Flow Rate (mL/min) 199 mL/min  Arterial Pressure (mmHg) -93.13 mmHg  Venous Pressure (mmHg) 74.95 mmHg  TMP (mmHg) 9.09 mmHg  Ultrafiltration Rate (mL/min) 538 mL/min  Dialysate Flow Rate (mL/min) 300 ml/min  Dialysate Potassium Concentration 3  Dialysate Calcium  Concentration 2.5  Duration of HD Treatment -hour(s) 3.5 hour(s)  Cumulative Fluid Removed (mL) per Treatment  1000.09  HD Safety Checks Performed Yes  Intra-Hemodialysis Comments Tx completed  Post Treatment  Dialyzer Clearance Lightly streaked  Hemodialysis Intake (mL) 0 mL  Liters Processed 64.9  Fluid Removed (mL) 1000 mL  Tolerated HD Treatment Yes  Post-Hemodialysis Comments Tolerated treatment well, no adverse effects noted  AVG/AVF Arterial Site Held (minutes) 0 minutes  AVG/AVF Venous Site Held (minutes) 0 minutes  Hemodialysis Catheter Right Internal jugular Double lumen Permanent (Tunneled)  Placement Date/Time: 08/07/24 1037   Serial / Lot #: [id number]  Expiration Date: 04/29/27  Time Out: Correct patient;Correct site;Correct procedure  Maximum sterile barrier  precautions: Hand hygiene;Cap;Mask;Sterile gown;Sterile gloves;Sterile probe ...  Site Condition No complications  Blue Lumen Status Flushed;Antimicrobial dead end cap;Heparin  locked  Red Lumen Status Flushed;Antimicrobial dead end cap;Heparin  locked  Purple Lumen Status N/A  Catheter fill solution Heparin  1000 units/ml  Catheter fill volume (Arterial) 2 cc  Catheter fill volume (Venous) 2.2  Dressing Type Gauze/Drain sponge  Dressing Status Clean, Dry, Intact  Drainage Description None  Dressing Change Due 08/14/24  Post treatment catheter status Capped and Clamped

## 2024-08-07 NOTE — Plan of Care (Signed)
°  Problem: Education: Goal: Ability to describe self-care measures that may prevent or decrease complications (Diabetes Survival Skills Education) will improve Outcome: Progressing   Problem: Coping: Goal: Ability to adjust to condition or change in health will improve Outcome: Progressing   Problem: Fluid Volume: Goal: Ability to maintain a balanced intake and output will improve Outcome: Progressing   Problem: Health Behavior/Discharge Planning: Goal: Ability to identify and utilize available resources and services will improve Outcome: Progressing Goal: Ability to manage health-related needs will improve Outcome: Progressing   Problem: Metabolic: Goal: Ability to maintain appropriate glucose levels will improve Outcome: Progressing   Problem: Nutritional: Goal: Maintenance of adequate nutrition will improve Outcome: Progressing Goal: Progress toward achieving an optimal weight will improve Outcome: Progressing   Problem: Skin Integrity: Goal: Risk for impaired skin integrity will decrease Outcome: Progressing   Problem: Tissue Perfusion: Goal: Adequacy of tissue perfusion will improve Outcome: Progressing   Problem: Education: Goal: Knowledge of General Education information will improve Description: Including pain rating scale, medication(s)/side effects and non-pharmacologic comfort measures Outcome: Progressing   Problem: Health Behavior/Discharge Planning: Goal: Ability to manage health-related needs will improve Outcome: Progressing   Problem: Clinical Measurements: Goal: Ability to maintain clinical measurements within normal limits will improve Outcome: Progressing Goal: Will remain free from infection Outcome: Progressing Goal: Diagnostic test results will improve Outcome: Progressing Goal: Respiratory complications will improve Outcome: Progressing Goal: Cardiovascular complication will be avoided Outcome: Progressing   Problem: Activity: Goal:  Risk for activity intolerance will decrease Outcome: Progressing   Problem: Nutrition: Goal: Adequate nutrition will be maintained Outcome: Progressing   Problem: Coping: Goal: Level of anxiety will decrease Outcome: Progressing   Problem: Elimination: Goal: Will not experience complications related to bowel motility Outcome: Progressing Goal: Will not experience complications related to urinary retention Outcome: Progressing   Problem: Pain Managment: Goal: General experience of comfort will improve and/or be controlled Outcome: Progressing   Problem: Safety: Goal: Ability to remain free from injury will improve Outcome: Progressing   Problem: Skin Integrity: Goal: Risk for impaired skin integrity will decrease Outcome: Progressing   Problem: Education: Goal: Knowledge of disease and its progression will improve Outcome: Progressing   Problem: Health Behavior/Discharge Planning: Goal: Ability to manage health-related needs will improve Outcome: Progressing   Problem: Clinical Measurements: Goal: Complications related to the disease process or treatment will be avoided or minimized Outcome: Progressing Goal: Dialysis access will remain free of complications Outcome: Progressing   Problem: Activity: Goal: Activity intolerance will improve Outcome: Progressing   Problem: Fluid Volume: Goal: Fluid volume balance will be maintained or improved Outcome: Progressing   Problem: Nutritional: Goal: Ability to make appropriate dietary choices will improve Outcome: Progressing   Problem: Respiratory: Goal: Respiratory symptoms related to disease process will be avoided Outcome: Progressing   Problem: Self-Concept: Goal: Body image disturbance will be avoided or minimized Outcome: Progressing   Problem: Urinary Elimination: Goal: Progression of disease will be identified and treated Outcome: Progressing

## 2024-08-07 NOTE — Progress Notes (Signed)
 [facility]  ROUNDING NOTE   Subjective:   Eric Browning is a 73 y.o. male with past medical history of diabetes, peripheral vascular disease, hypertension, congestive heart failure, hyperlipidemia, gout, BPH and chronic kidney disease with anemia and proteinuria. Patient presents to ED with shortness of breath and has been admitted for CHF (congestive heart failure) (HCC) [I50.9] SOB (shortness of breath) [R06.02] Hypoxia [R09.02] Acute on chronic systolic congestive heart failure (HCC) [I50.23] Acute respiratory failure with hypoxia and hypercarbia (HCC) [J96.01, J96.02]  Patient is known to our practice and was seen by Dr Dennise in October.   Update Patient seen laying in bed No family present Alert and oriented Room air NPO for permcath placement   Objective:  Vital signs in last 24 hours:  Temp:  [96.6 F (35.9 C)-98 F (36.7 C)] 97.4 F (36.3 C) (12/10 1158) Pulse Rate:  [63-77] 73 (12/10 1158) Resp:  [15-28] 19 (12/10 1158) BP: (101-156)/(53-91) 131/67 (12/10 1158) SpO2:  [79 %-100 %] 98 % (12/10 1158) Weight:  [93.7 kg] 93.7 kg (12/10 0500)  Weight change: 6 kg Filed Weights   08/05/24 1800 08/06/24 0500 08/07/24 0500  Weight: 87.7 kg 92.1 kg 93.7 kg    Intake/Output: I/O last 3 completed shifts: In: 1560 [P.O.:1560] Out: 500 [Urine:500]   Intake/Output this shift:  Total I/O In: 50 [IV Piggyback:50] Out: -   Physical Exam: General: NAD  Head: Normocephalic, atraumatic. Moist oral mucosal membranes  Eyes: Anicteric  Lungs:  Clear to auscultation, normal effort  Heart: Regular rate and rhythm  Abdomen:  Soft, nontender  Extremities:  1+ peripheral edema.  Neurologic: Awake, alert, conversant  Skin: Warm,dry, no rash  Access Rt femoral tempcath    Basic Metabolic Panel: Recent Labs  Lab 08/01/24 0410 08/02/24 0908 08/03/24 0853 08/05/24 0407 08/06/24 0356 08/07/24 0512  NA 136 137 137 138 137 137  K 4.6 4.7 4.6 4.5 4.1 4.0  CL  99 101 100 101 100 100  CO2 24 21* 25 25 26 26   GLUCOSE 164* 122* 99 120* 120* 85  BUN 96* 118* 107* 102* 62* 78*  CREATININE 4.96* 5.55* 5.35* 5.42* 3.61* 4.37*  CALCIUM  7.9* 7.8* 7.5* 8.2* 7.7* 8.0*  MG 2.8*  --   --   --   --   --   PHOS  --  7.6* 7.7*  --   --   --     Liver Function Tests: Recent Labs  Lab 08/02/24 0908 08/03/24 0853  ALBUMIN 3.7 3.3*   No results for input(s): LIPASE, AMYLASE in the last 168 hours. No results for input(s): AMMONIA in the last 168 hours.  CBC: Recent Labs  Lab 08/01/24 0410 08/02/24 1531 08/03/24 0853  WBC 12.0* 9.3 10.0  HGB 9.2* 9.0* 9.2*  HCT 29.9* 28.7* 29.5*  MCV 84.9 84.4 85.5  PLT 280 234 214    Cardiac Enzymes: No results for input(s): CKTOTAL, CKMB, CKMBINDEX, TROPONINI in the last 168 hours.  BNP: Invalid input(s): POCBNP  CBG: Recent Labs  Lab 08/06/24 1148 08/06/24 1646 08/07/24 0732 08/07/24 1002 08/07/24 1153  GLUCAP 85 94 83 90 115*    Microbiology: Results for orders placed or performed during the hospital encounter of 07/29/24  Resp panel by RT-PCR (RSV, Flu A&B, Covid) Anterior Nasal Swab     Status: None   Collection Time: 07/29/24  7:57 AM   Specimen: Anterior Nasal Swab  Result Value Ref Range Status   SARS Coronavirus 2 by RT PCR NEGATIVE  NEGATIVE Final    Comment: (NOTE) SARS-CoV-2 target nucleic acids are NOT DETECTED.  The SARS-CoV-2 RNA is generally detectable in upper respiratory specimens during the acute phase of infection. The lowest concentration of SARS-CoV-2 viral copies this assay can detect is 138 copies/mL. A negative result does not preclude SARS-Cov-2 infection and should not be used as the sole basis for treatment or other patient management decisions. A negative result may occur with  improper specimen collection/handling, submission of specimen other than nasopharyngeal swab, presence of viral mutation(s) within the areas targeted by this assay, and  inadequate number of viral copies(<138 copies/mL). A negative result must be combined with clinical observations, patient history, and epidemiological information. The expected result is Negative.  Fact Sheet for Patients:  bloggercourse.com  Fact Sheet for Healthcare Providers:  seriousbroker.it  This test is no t yet approved or cleared by the [place]  FDA and  has been authorized for detection and/or diagnosis of SARS-CoV-2 by FDA under an Emergency Use Authorization (EUA). This EUA will remain  in effect (meaning this test can be used) for the duration of the COVID-19 declaration under Section 564(b)(1) of the Act, 21 U.S.C.section 360bbb-3(b)(1), unless the authorization is terminated  or revoked sooner.       Influenza A by PCR NEGATIVE NEGATIVE Final   Influenza B by PCR NEGATIVE NEGATIVE Final    Comment: (NOTE) The Xpert Xpress SARS-CoV-2/FLU/RSV plus assay is intended as an aid in the diagnosis of influenza from Nasopharyngeal swab specimens and should not be used as a sole basis for treatment. Nasal washings and aspirates are unacceptable for Xpert Xpress SARS-CoV-2/FLU/RSV testing.  Fact Sheet for Patients: bloggercourse.com  Fact Sheet for Healthcare Providers: seriousbroker.it  This test is not yet approved or cleared by the [place]  FDA and has been authorized for detection and/or diagnosis of SARS-CoV-2 by FDA under an Emergency Use Authorization (EUA). This EUA will remain in effect (meaning this test can be used) for the duration of the COVID-19 declaration under Section 564(b)(1) of the Act, 21 U.S.C. section 360bbb-3(b)(1), unless the authorization is terminated or revoked.     Resp Syncytial Virus by PCR NEGATIVE NEGATIVE Final    Comment: (NOTE) Fact Sheet for Patients: bloggercourse.com  Fact Sheet for Healthcare  Providers: seriousbroker.it  This test is not yet approved or cleared by the [place]  FDA and has been authorized for detection and/or diagnosis of SARS-CoV-2 by FDA under an Emergency Use Authorization (EUA). This EUA will remain in effect (meaning this test can be used) for the duration of the COVID-19 declaration under Section 564(b)(1) of the Act, 21 U.S.C. section 360bbb-3(b)(1), unless the authorization is terminated or revoked.  Performed at [facility], [address] [place], [place] [address]     Coagulation Studies: No results for input(s): LABPROT, INR in the last 72 hours.   Urinalysis: No results for input(s): COLORURINE, LABSPEC, PHURINE, GLUCOSEU, HGBUR, BILIRUBINUR, KETONESUR, PROTEINUR, UROBILINOGEN, NITRITE, LEUKOCYTESUR in the last 72 hours.  Invalid input(s): APPERANCEUR    Imaging: PERIPHERAL VASCULAR CATHETERIZATION Result Date: 08/07/2024 See surgical note for result.     Medications:      (feeding supplement) PROSource Plus  30 mL Oral BID BM   amLODipine   5 mg Oral Daily   aspirin  EC  81 mg Oral Daily   carvedilol   12.5 mg Oral BID   Chlorhexidine  Gluconate Cloth  6 each Topical Q0600   epoetin  alfa-epbx (RETACRIT ) injection  4,000 Units Intravenous Q M,W,F-1800  ezetimibe   10 mg Oral Daily   fluticasone  furoate-vilanterol  1 puff Inhalation Daily   guaiFENesin   1,200 mg Oral BID   heparin   5,000 Units Subcutaneous Q12H   hydrALAZINE   25 mg Oral Q8H   insulin  aspart  0-9 Units Subcutaneous TID WC   isosorbide  mononitrate  30 mg Oral Daily   multivitamin  1 tablet Oral QHS   pravastatin   20 mg Oral QHS   predniSONE   20 mg Oral Q breakfast   tamsulosin   0.4 mg Oral Daily   torsemide   40 mg Oral Daily   acetaminophen , albuterol , alteplase , heparin , ondansetron  (ZOFRAN ) IV, mouth rinse  Assessment/ Plan:  Eric Browning is a 73 y.o.  adult with past medical history  of diabetes, peripheral vascular disease, hypertension, congestive heart failure, hyperlipidemia, gout, BPH and chronic kidney disease with anemia and proteinuria. Patient presents to ED with shortness of breath and has been admitted for CHF (congestive heart failure) (HCC) [I50.9] SOB (shortness of breath) [R06.02] Hypoxia [R09.02] Acute on chronic systolic congestive heart failure (HCC) [I50.23] Acute respiratory failure with hypoxia and hypercarbia (HCC) [J96.01, J96.02]   Acute Kidney Injury on chronic kidney disease stage 4 with baseline creatinine 3.0 and GFR of 21 on 07/03/2024.  Acute kidney injury secondary to volume overload with IV diuresis. Creatinine baseline on Ed arrival, now 4.2. Was given IV furosemide  for diuresis. Now held.   Due to persistent renal decline, patient agreeable to proceed with dialysis. Appreciate vascular surgery placing HD temp cath. First dialysis treatment received on 12/5.   Scheduled to receive dialysis later today. Vascular to place tunneled access today as well. Patient has been accepted to at [facility] on a MWF schedule. Cleared to start on Friday.    Lab Results  Component Value Date   CREATININE 4.37 (H) 08/07/2024   CREATININE 3.61 (H) 08/06/2024   CREATININE 5.42 (H) 08/05/2024    Intake/Output Summary (Last 24 hours) at 08/07/2024 1201 Last data filed at 08/07/2024 1154 Gross per 24 hour  Intake 650 ml  Output 500 ml  Net 150 ml   2. Chronic systolic heart failure , ECHO completed on 06/07/24 shows EF 40%. Prescribed Torsemide  40mg  twice daily, then decreased to daily, per patient. Diuretics held.  Continue Torsemide  40mg  daily  3. Anemia of chronic kidney disease  Lab Results  Component Value Date   HGB 9.2 (L) 08/03/2024   Hgb stable Continue low dose retacrit  with dialysis.   4. Hypertension with chronic kidney disease. Receiving amlodipine , carvedilol , hydralazine , and isosorbide . Blood pressure acceptable for this  patient    LOS: 9 Tahja Liao 12/10/202512:01 PM

## 2024-08-07 NOTE — Progress Notes (Signed)
 Initial Nutrition Assessment  DOCUMENTATION CODES:   Obesity unspecified  INTERVENTION:   -Liberalize diet to 2 gram sodium for wider variety of meal selections -Renal MVI daily -30 ml Prosource Plus BID, each supplement provides 100 kcals and 15 grams protein -Renal diet education provided; RD provided Nutrition for Dialysis handout from [facility] Nutrition Care Manual; attached to AVS/ discharge summary; patient will also have access to outpatient RD at outpatient HD center -Spiritual care consult; patient has had a lot of loss including wife (8 months ago) and multiple siblings -RD also discussed plan and recommendations with RN, MD, and HD Navigator; also discussed concern with resuming home DM medications due to renal failure  NUTRITION DIAGNOSIS:   Increased nutrient needs related to chronic illness (CHF, ESRD on HD) as evidenced by estimated needs.  GOAL:   Patient will meet greater than or equal to 90% of their needs  MONITOR:   PO intake, Supplement acceptance  REASON FOR ASSESSMENT:   Consult Diet education  ASSESSMENT:   Patient with medical history significant of CAD status post PCI and stenting, chronic HFrEF with LVEF 44%, severe pulmonary hypertension, COPD Gold stage IV, CKD stage IV, OSA on CPAP at bedtime, presented with worsening of cough wheezing shortness of breath.  Patient admitted with CHF and COPD exacerbation.   12/5- s/p insertion of temporary dialysis catheter catheter right femoral approach, first HD treatment   Reviewed I/O's: +340 ml x 24 hours and +417 ml since admission  UOP: 500 ml x 24 hours  Nephrology was consulted on 08/01/24 for AKI on CKD stage 4 secondary to volume overload with IV diuresis.   Per H&P, symptoms started 9 days PTA; he was using CPAP during the day to provide relief and torsemide  was not increased.  Symptoms started 9 days PTA- using CAPC during the day for relief. Did not increase torsemide   Case discussed with RN;  patient currently NPO for tunneled cath today.   Spoke with patient, who was sitting in recliner chair at time of visit. Patient reports that he feels better now that he has transitioned to the chair. He is pleasant and good spirits; eager to engage RD in conversation. Patient reports he has a good appetite and has been eating most of  his meals. He reports he has a good appetite at baseline and generally consumes 2 meals per day (eggs and sausage at breakfast and sandwich at dinner). Documented meal completions 70-100%.   He does not think he has lost any weight, but endorses weight fluctuations due to CHF and volume overload. His UBW is around 210-212#, but I know I weigh more than that because of the fluid. On exam, patient with moderate edema in lower extremities. Reviewed weight history; weight has fluctuated between 87.7-94.2 kg in the past 7 days. Suspect fluctuations related to edema and fluid losses from HD.   Patient reports that he is tolerating HD treatments well; his only complaint is that he feels tired at the end of treatments; RD assured patient that this is typical. He states that he has followed a renal diet in the past as he was following nephrology as an outpatient (his wife passed away 8 months ago secondary to renal failure and declining HD). He was able to identify high K, Phos, and sodium foods. RD discussed priority of low sodium diet to help with volume management.   Explained why diet restrictions are needed and provided lists of foods to limit/avoid that are high potassium, sodium, and  phosphorus. Provided specific recommendations on safer alternatives of these foods. Strongly encouraged compliance of this diet.   Discussed importance of protein intake at each meal and snack. Provided examples of how to maximize protein intake throughout the day. Discussed need for fluid restriction with dialysis, importance of minimizing weight gain between HD treatments, and renal-friendly  beverage options.  Encouraged pt to discuss specific diet questions/concerns with RD at HD outpatient facility. Teach back method used.  Expect fair compliance. RD also discussed that diet is often individualized based upon labs values and will have RD at outpatient HD center for further support.   Discussed importance of good meal and supplement intake to promote healing. Patient with no further questions, but expressed appreciation for visit.    Per TOC notes, plan for SNF placement at discharge. Patient has obtained outpatient HD placement.   Lab Results  Component Value Date   HGBA1C 5.5 07/29/2024   PTA DM medications are 500 mg metformin  daily and 10 mg farxiga daily. Discussed with medical team; both farxiga and metformin  are not recommended in patients with CKD or impaired renal function.   Labs reviewed: CBGS: 83-169 (inpatient orders for glycemic control are none).    NUTRITION - FOCUSED PHYSICAL EXAM:  Flowsheet Row Most Recent Value  Orbital Region No depletion  Upper Arm Region No depletion  Thoracic and Lumbar Region No depletion  Buccal Region No depletion  Temple Region No depletion  Clavicle Bone Region No depletion  Clavicle and Acromion Bone Region No depletion  Scapular Bone Region No depletion  Dorsal Hand No depletion  Patellar Region No depletion  Anterior Thigh Region No depletion  Posterior Calf Region No depletion  Edema (RD Assessment) Moderate  Hair Reviewed  Eyes Reviewed  Mouth Reviewed  Skin Reviewed  Nails Reviewed    Diet Order:   Diet Order             Diet 2 gram sodium Fluid consistency: Thin  Diet effective now                   EDUCATION NEEDS:   Education needs have been addressed  Skin:  Skin Assessment: Reviewed RN Assessment  Last BM:  08/05/24  Height:   Ht Readings from Last 1 Encounters:  07/29/24 5' 7 (1.702 m)    Weight:   Wt Readings from Last 1 Encounters:  08/07/24 93.9 kg    Ideal Body Weight:   67.3 kg  BMI:  Body mass index is 32.42 kg/m.  Estimated Nutritional Needs:   Kcal:  1800-2000  Protein:  90-105 grams  Fluid:  1000 ml + UOP    Margery ORN, RD, LDN, CDCES Registered Dietitian III Certified Diabetes Care and Education Specialist If unable to reach this RD, please use RD Inpatient group chat on secure chat between hours of 8am-4 pm daily

## 2024-08-07 NOTE — Plan of Care (Signed)
  Problem: Education: Goal: Ability to describe self-care measures that may prevent or decrease complications (Diabetes Survival Skills Education) will improve Outcome: Progressing Goal: Individualized Educational Video(s) Outcome: Progressing   Problem: Coping: Goal: Ability to adjust to condition or change in health will improve Outcome: Progressing   Problem: Fluid Volume: Goal: Ability to maintain a balanced intake and output will improve Outcome: Progressing   Problem: Health Behavior/Discharge Planning: Goal: Ability to identify and utilize available resources and services will improve Outcome: Progressing Goal: Ability to manage health-related needs will improve Outcome: Progressing   Problem: Metabolic: Goal: Ability to maintain appropriate glucose levels will improve Outcome: Progressing   Problem: Nutritional: Goal: Maintenance of adequate nutrition will improve Outcome: Progressing Goal: Progress toward achieving an optimal weight will improve Outcome: Progressing   Problem: Skin Integrity: Goal: Risk for impaired skin integrity will decrease Outcome: Progressing   Problem: Tissue Perfusion: Goal: Adequacy of tissue perfusion will improve Outcome: Progressing   Problem: Education: Goal: Knowledge of General Education information will improve Description: Including pain rating scale, medication(s)/side effects and non-pharmacologic comfort measures Outcome: Progressing   Problem: Health Behavior/Discharge Planning: Goal: Ability to manage health-related needs will improve Outcome: Progressing   Problem: Clinical Measurements: Goal: Ability to maintain clinical measurements within normal limits will improve Outcome: Progressing Goal: Will remain free from infection Outcome: Progressing Goal: Diagnostic test results will improve Outcome: Progressing Goal: Respiratory complications will improve Outcome: Progressing Goal: Cardiovascular complication will  be avoided Outcome: Progressing   Problem: Activity: Goal: Risk for activity intolerance will decrease Outcome: Progressing   Problem: Nutrition: Goal: Adequate nutrition will be maintained Outcome: Progressing   Problem: Coping: Goal: Level of anxiety will decrease Outcome: Progressing   Problem: Elimination: Goal: Will not experience complications related to bowel motility Outcome: Progressing Goal: Will not experience complications related to urinary retention Outcome: Progressing   Problem: Pain Managment: Goal: General experience of comfort will improve and/or be controlled Outcome: Progressing   Problem: Safety: Goal: Ability to remain free from injury will improve Outcome: Progressing   Problem: Skin Integrity: Goal: Risk for impaired skin integrity will decrease Outcome: Progressing   Problem: Education: Goal: Knowledge of disease and its progression will improve Outcome: Progressing   Problem: Health Behavior/Discharge Planning: Goal: Ability to manage health-related needs will improve Outcome: Progressing   Problem: Clinical Measurements: Goal: Complications related to the disease process or treatment will be avoided or minimized Outcome: Progressing Goal: Dialysis access will remain free of complications Outcome: Progressing   Problem: Activity: Goal: Activity intolerance will improve Outcome: Progressing   Problem: Fluid Volume: Goal: Fluid volume balance will be maintained or improved Outcome: Progressing   Problem: Nutritional: Goal: Ability to make appropriate dietary choices will improve Outcome: Progressing   Problem: Respiratory: Goal: Respiratory symptoms related to disease process will be avoided Outcome: Progressing   Problem: Self-Concept: Goal: Body image disturbance will be avoided or minimized Outcome: Progressing   Problem: Urinary Elimination: Goal: Progression of disease will be identified and treated Outcome:  Progressing

## 2024-08-08 DIAGNOSIS — Z95828 Presence of other vascular implants and grafts: Secondary | ICD-10-CM

## 2024-08-08 DIAGNOSIS — Z9889 Other specified postprocedural states: Secondary | ICD-10-CM

## 2024-08-08 LAB — GLUCOSE, CAPILLARY
Glucose-Capillary: 105 mg/dL — ABNORMAL HIGH (ref 70–99)
Glucose-Capillary: 138 mg/dL — ABNORMAL HIGH (ref 70–99)
Glucose-Capillary: 165 mg/dL — ABNORMAL HIGH (ref 70–99)
Glucose-Capillary: 149 mg/dL — ABNORMAL HIGH (ref 70–99)

## 2024-08-08 MED ORDER — ALLOPURINOL 100 MG PO TABS
100.0000 mg | ORAL_TABLET | Freq: Once | ORAL | Status: AC
  Administered 2024-08-08: 100 mg via ORAL
  Filled 2024-08-08: qty 1

## 2024-08-08 MED ORDER — ALLOPURINOL 100 MG PO TABS: 100.0000 mg | ORAL_TABLET | ORAL | Status: DC

## 2024-08-08 NOTE — Plan of Care (Signed)
  Problem: Education: Goal: Ability to describe self-care measures that may prevent or decrease complications (Diabetes Survival Skills Education) will improve Outcome: Progressing Goal: Individualized Educational Video(s) Outcome: Progressing   Problem: Coping: Goal: Ability to adjust to condition or change in health will improve Outcome: Progressing   Problem: Fluid Volume: Goal: Ability to maintain a balanced intake and output will improve Outcome: Progressing   Problem: Health Behavior/Discharge Planning: Goal: Ability to identify and utilize available resources and services will improve Outcome: Progressing Goal: Ability to manage health-related needs will improve Outcome: Progressing   Problem: Metabolic: Goal: Ability to maintain appropriate glucose levels will improve Outcome: Progressing   Problem: Nutritional: Goal: Maintenance of adequate nutrition will improve Outcome: Progressing Goal: Progress toward achieving an optimal weight will improve Outcome: Progressing   Problem: Skin Integrity: Goal: Risk for impaired skin integrity will decrease Outcome: Progressing   Problem: Tissue Perfusion: Goal: Adequacy of tissue perfusion will improve Outcome: Progressing   Problem: Education: Goal: Knowledge of General Education information will improve Description: Including pain rating scale, medication(s)/side effects and non-pharmacologic comfort measures Outcome: Progressing   Problem: Health Behavior/Discharge Planning: Goal: Ability to manage health-related needs will improve Outcome: Progressing   Problem: Clinical Measurements: Goal: Ability to maintain clinical measurements within normal limits will improve Outcome: Progressing Goal: Will remain free from infection Outcome: Progressing Goal: Diagnostic test results will improve Outcome: Progressing Goal: Respiratory complications will improve Outcome: Progressing Goal: Cardiovascular complication will  be avoided Outcome: Progressing   Problem: Activity: Goal: Risk for activity intolerance will decrease Outcome: Progressing   Problem: Nutrition: Goal: Adequate nutrition will be maintained Outcome: Progressing   Problem: Coping: Goal: Level of anxiety will decrease Outcome: Progressing   Problem: Elimination: Goal: Will not experience complications related to bowel motility Outcome: Progressing Goal: Will not experience complications related to urinary retention Outcome: Progressing   Problem: Pain Managment: Goal: General experience of comfort will improve and/or be controlled Outcome: Progressing   Problem: Safety: Goal: Ability to remain free from injury will improve Outcome: Progressing   Problem: Skin Integrity: Goal: Risk for impaired skin integrity will decrease Outcome: Progressing   Problem: Education: Goal: Knowledge of disease and its progression will improve Outcome: Progressing   Problem: Health Behavior/Discharge Planning: Goal: Ability to manage health-related needs will improve Outcome: Progressing   Problem: Clinical Measurements: Goal: Complications related to the disease process or treatment will be avoided or minimized Outcome: Progressing Goal: Dialysis access will remain free of complications Outcome: Progressing   Problem: Activity: Goal: Activity intolerance will improve Outcome: Progressing   Problem: Fluid Volume: Goal: Fluid volume balance will be maintained or improved Outcome: Progressing   Problem: Nutritional: Goal: Ability to make appropriate dietary choices will improve Outcome: Progressing   Problem: Respiratory: Goal: Respiratory symptoms related to disease process will be avoided Outcome: Progressing   Problem: Self-Concept: Goal: Body image disturbance will be avoided or minimized Outcome: Progressing   Problem: Urinary Elimination: Goal: Progression of disease will be identified and treated Outcome:  Progressing

## 2024-08-08 NOTE — Progress Notes (Signed)
 [facility]  ROUNDING NOTE   Subjective:   Eric Browning is a 73 y.o. male with past medical history of diabetes, peripheral vascular disease, hypertension, congestive heart failure, hyperlipidemia, gout, BPH and chronic kidney disease with anemia and proteinuria. Patient presents to ED with shortness of breath and has been admitted for CHF (congestive heart failure) (HCC) [I50.9] SOB (shortness of breath) [R06.02] Hypoxia [R09.02] Acute on chronic systolic congestive heart failure (HCC) [I50.23] Acute respiratory failure with hypoxia and hypercarbia (HCC) [J96.01, J96.02]  Patient is known to our practice and was seen by Dr Dennise in October.   Update Patient seen laying in bed Eating breakfast Room air   Objective:  Vital signs in last 24 hours:  Temp:  [97.6 F (36.4 C)-98.4 F (36.9 C)] 97.8 F (36.6 C) (12/11 1134) Pulse Rate:  [60-84] 66 (12/11 1134) Resp:  [10-21] 18 (12/11 1134) BP: (110-150)/(62-90) 110/65 (12/11 1134) SpO2:  [97 %-100 %] 100 % (12/11 1134) Weight:  [90.9 kg-93.9 kg] 90.9 kg (12/11 0536)  Weight change: 0.2 kg Filed Weights   08/07/24 1313 08/07/24 1745 08/08/24 0536  Weight: 93.9 kg 92.9 kg 90.9 kg    Intake/Output: I/O last 3 completed shifts: In: 650 [P.O.:600; IV Piggyback:50] Out: 1500 [Urine:500; Other:1000]   Intake/Output this shift:  Total I/O In: 480 [P.O.:480] Out: -   Physical Exam: General: NAD  Head: Normocephalic, atraumatic. Moist oral mucosal membranes  Eyes: Anicteric  Lungs:  Clear to auscultation, normal effort  Heart: Regular rate and rhythm  Abdomen:  Soft, nontender  Extremities:  1+ peripheral edema.  Neurologic: Awake, alert, conversant  Skin: Warm,dry, no rash  Access Rt femoral tempcath, Rt internal jugular permcath    Basic Metabolic Panel: Recent Labs  Lab 08/02/24 0908 08/03/24 0853 08/05/24 0407 08/06/24 0356 08/07/24 0512  NA 137 137 138 137 137  K 4.7 4.6 4.5 4.1 4.0  CL 101 100  101 100 100  CO2 21* 25 25 26 26   GLUCOSE 122* 99 120* 120* 85  BUN 118* 107* 102* 62* 78*  CREATININE 5.55* 5.35* 5.42* 3.61* 4.37*  CALCIUM  7.8* 7.5* 8.2* 7.7* 8.0*  PHOS 7.6* 7.7*  --   --   --     Liver Function Tests: Recent Labs  Lab 08/02/24 0908 08/03/24 0853  ALBUMIN 3.7 3.3*   No results for input(s): LIPASE, AMYLASE in the last 168 hours. No results for input(s): AMMONIA in the last 168 hours.  CBC: Recent Labs  Lab 08/02/24 1531 08/03/24 0853  WBC 9.3 10.0  HGB 9.0* 9.2*  HCT 28.7* 29.5*  MCV 84.4 85.5  PLT 234 214    Cardiac Enzymes: No results for input(s): CKTOTAL, CKMB, CKMBINDEX, TROPONINI in the last 168 hours.  BNP: Invalid input(s): POCBNP  CBG: Recent Labs  Lab 08/07/24 1153 08/07/24 1816 08/07/24 2133 08/08/24 0731 08/08/24 1134  GLUCAP 115* 129* 141* 105* 138*    Microbiology: Results for orders placed or performed during the hospital encounter of 07/29/24  Resp panel by RT-PCR (RSV, Flu A&B, Covid) Anterior Nasal Swab     Status: None   Collection Time: 07/29/24  7:57 AM   Specimen: Anterior Nasal Swab  Result Value Ref Range Status   SARS Coronavirus 2 by RT PCR NEGATIVE NEGATIVE Final    Comment: (NOTE) SARS-CoV-2 target nucleic acids are NOT DETECTED.  The SARS-CoV-2 RNA is generally detectable in upper respiratory specimens during the acute phase of infection. The lowest concentration of SARS-CoV-2 viral copies this  assay can detect is 138 copies/mL. A negative result does not preclude SARS-Cov-2 infection and should not be used as the sole basis for treatment or other patient management decisions. A negative result may occur with  improper specimen collection/handling, submission of specimen other than nasopharyngeal swab, presence of viral mutation(s) within the areas targeted by this assay, and inadequate number of viral copies(<138 copies/mL). A negative result must be combined with clinical  observations, patient history, and epidemiological information. The expected result is Negative.  Fact Sheet for Patients:  bloggercourse.com  Fact Sheet for Healthcare Providers:  seriousbroker.it  This test is no t yet approved or cleared by the [place]  FDA and  has been authorized for detection and/or diagnosis of SARS-CoV-2 by FDA under an Emergency Use Authorization (EUA). This EUA will remain  in effect (meaning this test can be used) for the duration of the COVID-19 declaration under Section 564(b)(1) of the Act, 21 U.S.C.section 360bbb-3(b)(1), unless the authorization is terminated  or revoked sooner.       Influenza A by PCR NEGATIVE NEGATIVE Final   Influenza B by PCR NEGATIVE NEGATIVE Final    Comment: (NOTE) The Xpert Xpress SARS-CoV-2/FLU/RSV plus assay is intended as an aid in the diagnosis of influenza from Nasopharyngeal swab specimens and should not be used as a sole basis for treatment. Nasal washings and aspirates are unacceptable for Xpert Xpress SARS-CoV-2/FLU/RSV testing.  Fact Sheet for Patients: bloggercourse.com  Fact Sheet for Healthcare Providers: seriousbroker.it  This test is not yet approved or cleared by the [place]  FDA and has been authorized for detection and/or diagnosis of SARS-CoV-2 by FDA under an Emergency Use Authorization (EUA). This EUA will remain in effect (meaning this test can be used) for the duration of the COVID-19 declaration under Section 564(b)(1) of the Act, 21 U.S.C. section 360bbb-3(b)(1), unless the authorization is terminated or revoked.     Resp Syncytial Virus by PCR NEGATIVE NEGATIVE Final    Comment: (NOTE) Fact Sheet for Patients: bloggercourse.com  Fact Sheet for Healthcare Providers: seriousbroker.it  This test is not yet approved or cleared by  the [place]  FDA and has been authorized for detection and/or diagnosis of SARS-CoV-2 by FDA under an Emergency Use Authorization (EUA). This EUA will remain in effect (meaning this test can be used) for the duration of the COVID-19 declaration under Section 564(b)(1) of the Act, 21 U.S.C. section 360bbb-3(b)(1), unless the authorization is terminated or revoked.  Performed at [facility], [address] [place], [place] [address]     Coagulation Studies: No results for input(s): LABPROT, INR in the last 72 hours.   Urinalysis: No results for input(s): COLORURINE, LABSPEC, PHURINE, GLUCOSEU, HGBUR, BILIRUBINUR, KETONESUR, PROTEINUR, UROBILINOGEN, NITRITE, LEUKOCYTESUR in the last 72 hours.  Invalid input(s): APPERANCEUR    Imaging: PERIPHERAL VASCULAR CATHETERIZATION Result Date: 08/07/2024 See surgical note for result.     Medications:      (feeding supplement) PROSource Plus  30 mL Oral BID BM   amLODipine   5 mg Oral Daily   aspirin  EC  81 mg Oral Daily   carvedilol   12.5 mg Oral BID   Chlorhexidine  Gluconate Cloth  6 each Topical Q0600   epoetin  alfa-epbx (RETACRIT ) injection  4,000 Units Intravenous Q M,W,F-1800   ezetimibe   10 mg Oral Daily   fluticasone  furoate-vilanterol  1 puff Inhalation Daily   guaiFENesin   1,200 mg Oral BID   heparin   5,000 Units Subcutaneous Q12H   hydrALAZINE   25 mg Oral Q8H  insulin  aspart  0-9 Units Subcutaneous TID WC   isosorbide  mononitrate  30 mg Oral Daily   multivitamin  1 tablet Oral QHS   pravastatin   20 mg Oral QHS   tamsulosin   0.4 mg Oral Daily   torsemide   40 mg Oral Daily   acetaminophen , albuterol , alteplase , heparin , ondansetron  (ZOFRAN ) IV, mouth rinse  Assessment/ Plan:  [name] is a 73 y.o.  adult with past medical history of diabetes, peripheral vascular disease, hypertension, congestive heart failure, hyperlipidemia, gout, BPH and chronic kidney disease  with anemia and proteinuria. Patient presents to ED with shortness of breath and has been admitted for CHF (congestive heart failure) (HCC) [I50.9] SOB (shortness of breath) [R06.02] Hypoxia [R09.02] Acute on chronic systolic congestive heart failure (HCC) [I50.23] Acute respiratory failure with hypoxia and hypercarbia (HCC) [J96.01, J96.02]   Acute Kidney Injury on chronic kidney disease stage 4 with baseline creatinine 3.0 and GFR of 21 on 07/03/2024.  Acute kidney injury secondary to volume overload with IV diuresis. Creatinine baseline on Ed arrival, now 4.2. Was given IV furosemide  for diuresis. Now held.   Due to persistent renal decline, patient agreeable to proceed with dialysis. Appreciate vascular surgery placing HD temp cath. First dialysis treatment received on 12/5.   Received dialysis yesterday, UF 1l achieved. Next treatment scheduled for Friday. Monitoring d/c plan.  Has been accepted to [facility] on a MWF schedule.    Lab Results  Component Value Date   CREATININE 4.37 (H) 08/07/2024   CREATININE 3.61 (H) 08/06/2024   CREATININE 5.42 (H) 08/05/2024    Intake/Output Summary (Last 24 hours) at 08/08/2024 1218 Last data filed at 08/08/2024 1043 Gross per 24 hour  Intake 480 ml  Output 1000 ml  Net -520 ml   2. Chronic systolic heart failure , ECHO completed on 06/07/24 shows EF 40%. Prescribed Torsemide  40mg  twice daily, then decreased to daily, per patient. Diuretics held.  Continue Torsemide  40mg  daily  3. Anemia of chronic kidney disease  Lab Results  Component Value Date   HGB 9.2 (L) 08/03/2024   Continue low dose retacrit  with dialysis.   4. Hypertension with chronic kidney disease. Receiving amlodipine , carvedilol , hydralazine , and isosorbide . Blood pressure stable   LOS: 10 Eric Browning 12/11/202512:18 PM

## 2024-08-08 NOTE — Progress Notes (Signed)
 PROGRESS NOTE   HPI was taken from Dr. Laurita:  Eric Browning is a 73 y.o. adult with medical history significant of CAD status post PCI and stenting, chronic HFrEF with LVEF 44%, severe pulmonary hypertension, COPD Gold stage IV, CKD stage IV, OSA on CPAP at bedtime, presented with worsening of cough wheezing shortness of breath.   Symptoms started 9 to 10 days ago when patient started to have increasing exertional dyspnea, cough with occasional clear phlegm, and wheezing.  Last several days, even walking inside his home bedroom causing significant shortness of breath.  For which patient has been using his nocturnal CPAP at daytime to compensate with some relief.  He however did not increase his torsemide  dosage.  He denied any chest pain, no fever or chills.  Over the weekend he has also had decreased appetite but denied any nauseous vomiting, no abdominal pain or diarrhea.  Denied any urinary symptoms.   ED Course: Afebrile, heart rate 80s, blood pressure 180/90 O2 saturation 89% on 2 L.  O2 saturation 98% on BiPAP with FiO2 35%.  Chest x-ray showed pulmonary congestion bilaterally, blood work showed VBG 7.30/64/48, WBC 7.1 hemoglobin 10.5 BUN 36 creatinine 2.9 sodium 147 potassium 4.1 bicarb 28.   Patient was given IV Lasix  40 mg, IV Solu-Medrol , DuoNebs x 1 in the ED    Eric Browning  FMW:[id number] DOB: [birth year] DOA: 07/29/2024 PCP: Alla Amis, MD   Assessment & Plan:   Principal Problem:   CHF (congestive heart failure) (HCC) Active Problems:   AKI (acute kidney injury)   COPD with acute exacerbation (HCC)   Essential hypertension   Symptomatic anemia   Primary male hypogonadism   Pure hypercholesterolemia   Type 2 diabetes mellitus without complication, without long-term current use of insulin  (HCC)   Personal history of gout   Encounter for dialysis and dialysis catheter care   ESRD (end stage renal disease) (HCC)  Assessment and Plan: Acute on chronic systolic CHF  exacerbation: holding lasix  as pt is now on HD and with po torsemide . Continue on coreg .     COPD exacerbation: resolved. completed steroid taper, bronchodilators & encourage incentive spirometry  AKI on CKDIV: started on HD on 08/02/24 as per nephro. S/p placement of tunneled HD cath 08/07/24 as per vasc surg . Has outpt chair. Permacath placed 12/10, temp cath removed this morning. Next dialysis tomorrow, will be cleared for d/c if permacath functional  HTN: controlled, continue on coreg , imdur , amlodipine , hydralazine     DM2: controlled here. Continue on SSI w/ accuchecks   HLD: continue on statin, zetia    Hx of gout: will ask pharmacy to dose home allopurinol    Obesity: BMI 32.5. Would benefit from weight loss    DVT prophylaxis: heparin   Code Status: full  Family Communication: friend at bedside 12/11 Disposition Plan:   SNF, has bed offers  Status is: Inpatient Remains inpatient appropriate because: pending dialysis tomorrow and snf placement   Level of care: Progressive Consultants:  Nephro     Subjective: Feeling well, no dyspnea or cough, tolerating diet, bm today  Objective: Vitals:   08/08/24 0536 08/08/24 0733 08/08/24 1134 08/08/24 1404  BP:  133/69 110/65 110/65  Pulse:  74 66   Resp:  16 18   Temp:  98.3 F (36.8 C) 97.8 F (36.6 C)   TempSrc:      SpO2:  97% 100%   Weight: 90.9 kg     Height:        Intake/Output Summary (  Last 24 hours) at 08/08/2024 1407 Last data filed at 08/08/2024 1043 Gross per 24 hour  Intake 480 ml  Output 1000 ml  Net -520 ml   Filed Weights   08/07/24 1313 08/07/24 1745 08/08/24 0536  Weight: 93.9 kg 92.9 kg 90.9 kg    Examination:  General exam: Appears comfortable Respiratory system: diminished breath sounds b/l  Cardiovascular system: S1 & S2+. Gastrointestinal system: abd is soft, NT,   Central nervous system: alert & oriented. Moves all extremities Skin: right chest permacath Psychiatry: judgement and  insight appears at baseline. Flat mood and affect    Data Reviewed: I have personally reviewed following labs and imaging studies  CBC: Recent Labs  Lab 08/02/24 1531 08/03/24 0853  WBC 9.3 10.0  HGB 9.0* 9.2*  HCT 28.7* 29.5*  MCV 84.4 85.5  PLT 234 214   Basic Metabolic Panel: Recent Labs  Lab 08/02/24 0908 08/03/24 0853 08/05/24 0407 08/06/24 0356 08/07/24 0512  NA 137 137 138 137 137  K 4.7 4.6 4.5 4.1 4.0  CL 101 100 101 100 100  CO2 21* 25 25 26 26   GLUCOSE 122* 99 120* 120* 85  BUN 118* 107* 102* 62* 78*  CREATININE 5.55* 5.35* 5.42* 3.61* 4.37*  CALCIUM  7.8* 7.5* 8.2* 7.7* 8.0*  PHOS 7.6* 7.7*  --   --   --    GFR: Estimated Creatinine Clearance (by C-G formula based on SCr of 4.37 mg/dL (H)) Male: 87.0 mL/min (A) Male: 15.7 mL/min (A) Liver Function Tests: Recent Labs  Lab 08/02/24 0908 08/03/24 0853  ALBUMIN 3.7 3.3*   No results for input(s): LIPASE, AMYLASE in the last 168 hours. No results for input(s): AMMONIA in the last 168 hours. Coagulation Profile: No results for input(s): INR, PROTIME in the last 168 hours.  Cardiac Enzymes: No results for input(s): CKTOTAL, CKMB, CKMBINDEX, TROPONINI in the last 168 hours. BNP (last 3 results) Recent Labs    07/29/24 0800  PROBNP 7,322.0*   HbA1C: No results for input(s): HGBA1C in the last 72 hours. CBG: Recent Labs  Lab 08/07/24 1153 08/07/24 1816 08/07/24 2133 08/08/24 0731 08/08/24 1134  GLUCAP 115* 129* 141* 105* 138*   Lipid Profile: No results for input(s): CHOL, HDL, LDLCALC, TRIG, CHOLHDL, LDLDIRECT in the last 72 hours. Thyroid Function Tests: No results for input(s): TSH, T4TOTAL, FREET4, T3FREE, THYROIDAB in the last 72 hours. Anemia Panel: No results for input(s): VITAMINB12, FOLATE, FERRITIN, TIBC, IRON, RETICCTPCT in the last 72 hours. Sepsis Labs: No results for input(s): PROCALCITON, LATICACIDVEN in the last  168 hours.  No results found for this or any previous visit (from the past 240 hours).        Radiology Studies: PERIPHERAL VASCULAR CATHETERIZATION Result Date: 08/07/2024 See surgical note for result.        Scheduled Meds:  (feeding supplement) PROSource Plus  30 mL Oral BID BM   amLODipine   5 mg Oral Daily   aspirin  EC  81 mg Oral Daily   carvedilol   12.5 mg Oral BID   Chlorhexidine  Gluconate Cloth  6 each Topical Q0600   epoetin  alfa-epbx (RETACRIT ) injection  4,000 Units Intravenous Q M,W,F-1800   ezetimibe   10 mg Oral Daily   fluticasone  furoate-vilanterol  1 puff Inhalation Daily   guaiFENesin   1,200 mg Oral BID   heparin   5,000 Units Subcutaneous Q12H   hydrALAZINE   25 mg Oral Q8H   insulin  aspart  0-9 Units Subcutaneous TID WC   isosorbide  mononitrate  30  mg Oral Daily   multivitamin  1 tablet Oral QHS   pravastatin   20 mg Oral QHS   tamsulosin   0.4 mg Oral Daily   torsemide   40 mg Oral Daily   Continuous Infusions:   LOS: 10 days       Devaughn KATHEE Ban, MD [facility]  If 7PM-7AM, please contact night-coverage [email or web address] 08/08/2024, 2:07 PM

## 2024-08-08 NOTE — Progress Notes (Signed)
 Occupational Therapy Treatment Patient Details Name: Eric Browning MRN: [id number] DOB: 07-19-1951 Today's Date: 08/08/2024   History of present illness Pt is a 73 y.o. adult with PMH of CHF, CAD status post PCI and stenting, chronic HFrEF, severe pulmonary hypertension, COPD Gold stage IV, CKD stage IV, OSA. MD assessment i.ncludes: acute on chronic congestive heart failure with reduced ejection fraction, s/p temp hemodialysis femoral cath R LE   OT comments  Upon entering the room, pt in recliner chair and agreeable to OT intervention. Pt had perm cath placed yesterday. Pt standing from recliner chair with RW and min guard with cues for hand placement. Pt ambulating 25' into bathroom with min guard. Pt stands at sink to wash face and comb hair with min guard for balance. Pt then able to continue ambulating to door and then turns around and returns to recliner chair ~ 40' with RW and min guard. Chair alarm activated. Call bell and all needed items within reach. Pt making progress towards goals and endurance this session.       If plan is discharge home, recommend the following:  A little help with walking and/or transfers;A little help with bathing/dressing/bathroom   Equipment Recommendations  Other (comment) (defer)       Precautions / Restrictions Precautions Precautions: Fall Recall of Precautions/Restrictions: Intact       Mobility Bed Mobility               General bed mobility comments: seated in recliner chair at beginning and end of session    Transfers Overall transfer level: Needs assistance Equipment used: Rolling walker (2 wheels) Transfers: Sit to/from Stand Sit to Stand: Contact guard assist                 Balance Overall balance assessment: Needs assistance Sitting-balance support: Bilateral upper extremity supported Sitting balance-Leahy Scale: Fair     Standing balance support: Bilateral upper extremity supported, During functional activity,  Reliant on assistive device for balance Standing balance-Leahy Scale: Fair                             ADL either performed or assessed with clinical judgement   ADL Overall ADL's : Needs assistance/impaired     Grooming: Contact guard assist;Standing;Wash/dry hands;Wash/dry face                                       Vision Patient Visual Report: No change from baseline           Communication Communication Communication: No apparent difficulties   Cognition Arousal: Alert Behavior During Therapy: WFL for tasks assessed/performed Cognition: No apparent impairments                               Following commands: Intact        Cueing   Cueing Techniques: Verbal cues             Pertinent Vitals/ Pain       Pain Assessment Pain Assessment: No/denies pain         Frequency  Min 2X/week        Progress Toward Goals  OT Goals(current goals can now be found in the care plan section)  Progress towards OT goals: Progressing toward goals      AM-PAC  OT 6 Clicks Daily Activity     Outcome Measure   Help from another person eating meals?: None Help from another person taking care of personal grooming?: A Little Help from another person toileting, which includes using toliet, bedpan, or urinal?: A Lot Help from another person bathing (including washing, rinsing, drying)?: A Lot Help from another person to put on and taking off regular upper body clothing?: A Little Help from another person to put on and taking off regular lower body clothing?: A Lot 6 Click Score: 16    End of Session Equipment Utilized During Treatment: Rolling walker (2 wheels)  OT Visit Diagnosis: Unsteadiness on feet (R26.81);Muscle weakness (generalized) (M62.81)   Activity Tolerance Patient tolerated treatment well   Patient Left in bed;with family/visitor present;with call bell/phone within reach;with bed alarm set   Nurse  Communication Mobility status        Time: 8899-8878 OT Time Calculation (min): 21 min  Charges: OT General Charges $OT Visit: 1 Visit OT Treatments $Self Care/Home Management : 8-22 mins  Izetta Claude, MS, OTR/L , CBIS ascom [phone]  08/08/2024, 2:11 PM

## 2024-08-08 NOTE — Progress Notes (Signed)
 Progress Note    08/08/2024 9:20 AM 1 Day Post-Op  Subjective:  Eric Browning is a 73 yo male now POD #1 from:  PRE-OPERATIVE DIAGNOSIS: 1. ESRD     POST-OPERATIVE DIAGNOSIS: same as above   PROCEDURE: Ultrasound guidance for vascular access to the right internal jugular vein Fluoroscopic guidance for placement of catheter Placement of a 19 cm tip to cuff tunneled hemodialysis catheter via the right internal jugular vein   SURGEON: Selinda Gu, MD   ANESTHESIA:  Local with Moderate conscious sedation for approximately 20 minutes using 1 mg of Versed  and 50 mcg of Fentanyl    ESTIMATED BLOOD LOSS: 5 cc   FLUORO TIME: less than one minute   CONTRAST: none   FINDING(S): 1.  Patent right internal jugular vein   SPECIMEN(S):  None   INDICATIONS:   Eric Browning is a 73 y.o.adult who presents with renal failure.  The patient needs long term dialysis access for their ESRD, and a Permcath is necessary.   Patient is resting comfortably sitting on the side of the bed this morning.  No complaints overnight.  Endorses that his right chest where the dialysis catheter is placed is sore but tolerable.  Taking Tylenol  for the pain.  Vitals are remained stable.   Vitals:   08/08/24 0402 08/08/24 0733  BP: (!) 146/68 133/69  Pulse: 64 74  Resp: 18 16  Temp: 98.1 F (36.7 C) 98.3 F (36.8 C)  SpO2: 97% 97%   Physical Exam: Cardiac:  RRR, Normal S1, S2. No Murmurs Lungs:  Non labored and clear on auscultation throughout. Incisions:  Right groin with temp catheter. Right chest with [facility] Catheter. Dressing clean dry and intact. No hematoma or seroma to note.  Extremities:  All extremities are warm to touch with palpable pulses.  Abdomen:  Positive bowel sounds throughout, soft non tender and non distended Neurologic: AAOX3, answers all questions and follows commands.  CBC    Component Value Date/Time   WBC 10.0 08/03/2024 0853   RBC 3.45 (L) 08/03/2024 0853   HGB 9.2 (L)  08/03/2024 0853   HGB 9.4 (L) 04/24/2024 1409   HCT 29.5 (L) 08/03/2024 0853   PLT 214 08/03/2024 0853   PLT 308 04/24/2024 1409   MCV 85.5 08/03/2024 0853   MCH 26.7 08/03/2024 0853   MCHC 31.2 08/03/2024 0853   RDW 16.0 (H) 08/03/2024 0853   LYMPHSABS 1.3 04/24/2024 1409   MONOABS 0.7 04/24/2024 1409   EOSABS 0.2 04/24/2024 1409   BASOSABS 0.1 04/24/2024 1409    BMET    Component Value Date/Time   NA 137 08/07/2024 0512   K 4.0 08/07/2024 0512   K 3.6 10/31/2011 0614   CL 100 08/07/2024 0512   CO2 26 08/07/2024 0512   GLUCOSE 85 08/07/2024 0512   BUN 78 (H) 08/07/2024 0512   CREATININE 4.37 (H) 08/07/2024 0512   CREATININE 3.00 (H) 04/24/2024 1409   CALCIUM  8.0 (L) 08/07/2024 0512   GFRNONAA 13 (L) 08/07/2024 0512   GFRNONAA 21 (L) 04/24/2024 1409   GFRAA >60 12/13/2017 0716    INR    Component Value Date/Time   INR 1.1 07/29/2024 0800     Intake/Output Summary (Last 24 hours) at 08/08/2024 0920 Last data filed at 08/07/2024 1745 Gross per 24 hour  Intake 50 ml  Output 1000 ml  Net -950 ml     Assessment/Plan:  73 y.o. adult is s/p SEE ABOVE  1 Day Post-Op   PLAN Patient is  now postop dialysis permacatheter access for hemodialysis outpatient.  Nephrology plans on taking the patient to dialysis tomorrow on 08/09/2024.  If the catheter works well the temporary catheter can be removed and the patient can be discharged if nephrology agrees. Vascular surgery to sign off at this time.  DVT prophylaxis: Heparin  with dialysis.   [name] [facility] 08/08/2024 9:20 AM

## 2024-08-09 DIAGNOSIS — I5023 Acute on chronic systolic (congestive) heart failure: Secondary | ICD-10-CM | POA: Diagnosis not present

## 2024-08-09 LAB — CBC
HCT: 29.1 % — ABNORMAL LOW (ref 39.0–52.0)
Hemoglobin: 9 g/dL — ABNORMAL LOW (ref 13.0–17.0)
MCH: 26.4 pg (ref 26.0–34.0)
MCHC: 30.9 g/dL (ref 30.0–36.0)
MCV: 85.3 fL (ref 80.0–100.0)
Platelets: 201 K/uL (ref 150–400)
RBC: 3.41 MIL/uL — ABNORMAL LOW (ref 4.22–5.81)
RDW: 16.8 % — ABNORMAL HIGH (ref 11.5–15.5)
WBC: 13.1 K/uL — ABNORMAL HIGH (ref 4.0–10.5)
nRBC: 0 % (ref 0.0–0.2)

## 2024-08-09 LAB — RENAL FUNCTION PANEL
Albumin: 3.3 g/dL — ABNORMAL LOW (ref 3.5–5.0)
Anion gap: 8 (ref 5–15)
BUN: 69 mg/dL — ABNORMAL HIGH (ref 8–23)
CO2: 28 mmol/L (ref 22–32)
Calcium: 8.5 mg/dL — ABNORMAL LOW (ref 8.9–10.3)
Chloride: 102 mmol/L (ref 98–111)
Creatinine, Ser: 4.26 mg/dL — ABNORMAL HIGH (ref 0.61–1.24)
GFR, Estimated: 14 mL/min — ABNORMAL LOW (ref >60)
Glucose, Bld: 103 mg/dL — ABNORMAL HIGH (ref 70–99)
Phosphorus: 4.7 mg/dL — ABNORMAL HIGH (ref 2.5–4.6)
Potassium: 3.8 mmol/L (ref 3.5–5.1)
Sodium: 138 mmol/L (ref 135–145)

## 2024-08-09 LAB — GLUCOSE, CAPILLARY: Glucose-Capillary: 81 mg/dL (ref 70–99)

## 2024-08-09 MED ORDER — ALLOPURINOL 100 MG PO TABS: 100.0000 mg | ORAL_TABLET | ORAL | Status: AC

## 2024-08-09 MED ORDER — EPOETIN ALFA-EPBX 4000 UNIT/ML IJ SOLN
INTRAMUSCULAR | Status: AC
  Filled 2024-08-09: qty 1

## 2024-08-09 MED ORDER — HEPARIN SODIUM (PORCINE) 1000 UNIT/ML IJ SOLN
INTRAMUSCULAR | Status: AC
  Filled 2024-08-09: qty 5

## 2024-08-09 MED ORDER — ISOSORBIDE MONONITRATE ER 30 MG PO TB24: 30.0000 mg | ORAL_TABLET | Freq: Every day | ORAL | Status: AC

## 2024-08-09 NOTE — Progress Notes (Signed)
 Hemodialysis Note:  Received patient in bed to unit. Alert and oriented. Informed consent singed and in chart.  Treatment initiated: 0749 Treatment completed: 1125  Access used: Right internal jugular catheter Access issues: None  Patient tolerated well. Transported back to room, alert without acute distress. Report given to patient's RN.  Total UF removed: 1.5 liters Medications given: Retacrit  4000 units IV  Post HD weight: 87.1 kg  [facility]

## 2024-08-09 NOTE — Plan of Care (Signed)
  Problem: Education: Goal: Ability to describe self-care measures that may prevent or decrease complications (Diabetes Survival Skills Education) will improve Outcome: Progressing Goal: Individualized Educational Video(s) Outcome: Progressing   Problem: Coping: Goal: Ability to adjust to condition or change in health will improve Outcome: Progressing   Problem: Fluid Volume: Goal: Ability to maintain a balanced intake and output will improve Outcome: Progressing   Problem: Health Behavior/Discharge Planning: Goal: Ability to identify and utilize available resources and services will improve Outcome: Progressing Goal: Ability to manage health-related needs will improve Outcome: Progressing   Problem: Metabolic: Goal: Ability to maintain appropriate glucose levels will improve Outcome: Progressing   Problem: Nutritional: Goal: Maintenance of adequate nutrition will improve Outcome: Progressing Goal: Progress toward achieving an optimal weight will improve Outcome: Progressing   Problem: Skin Integrity: Goal: Risk for impaired skin integrity will decrease Outcome: Progressing   Problem: Tissue Perfusion: Goal: Adequacy of tissue perfusion will improve Outcome: Progressing   Problem: Education: Goal: Knowledge of General Education information will improve Description: Including pain rating scale, medication(s)/side effects and non-pharmacologic comfort measures Outcome: Progressing   Problem: Health Behavior/Discharge Planning: Goal: Ability to manage health-related needs will improve Outcome: Progressing   Problem: Clinical Measurements: Goal: Ability to maintain clinical measurements within normal limits will improve Outcome: Progressing Goal: Will remain free from infection Outcome: Progressing Goal: Diagnostic test results will improve Outcome: Progressing Goal: Respiratory complications will improve Outcome: Progressing Goal: Cardiovascular complication will  be avoided Outcome: Progressing   Problem: Activity: Goal: Risk for activity intolerance will decrease Outcome: Progressing   Problem: Nutrition: Goal: Adequate nutrition will be maintained Outcome: Progressing   Problem: Coping: Goal: Level of anxiety will decrease Outcome: Progressing   Problem: Elimination: Goal: Will not experience complications related to bowel motility Outcome: Progressing Goal: Will not experience complications related to urinary retention Outcome: Progressing   Problem: Pain Managment: Goal: General experience of comfort will improve and/or be controlled Outcome: Progressing   Problem: Safety: Goal: Ability to remain free from injury will improve Outcome: Progressing   Problem: Skin Integrity: Goal: Risk for impaired skin integrity will decrease Outcome: Progressing   Problem: Education: Goal: Knowledge of disease and its progression will improve Outcome: Progressing   Problem: Health Behavior/Discharge Planning: Goal: Ability to manage health-related needs will improve Outcome: Progressing   Problem: Clinical Measurements: Goal: Complications related to the disease process or treatment will be avoided or minimized Outcome: Progressing Goal: Dialysis access will remain free of complications Outcome: Progressing   Problem: Activity: Goal: Activity intolerance will improve Outcome: Progressing   Problem: Fluid Volume: Goal: Fluid volume balance will be maintained or improved Outcome: Progressing   Problem: Nutritional: Goal: Ability to make appropriate dietary choices will improve Outcome: Progressing   Problem: Respiratory: Goal: Respiratory symptoms related to disease process will be avoided Outcome: Progressing   Problem: Self-Concept: Goal: Body image disturbance will be avoided or minimized Outcome: Progressing   Problem: Urinary Elimination: Goal: Progression of disease will be identified and treated Outcome:  Progressing

## 2024-08-09 NOTE — TOC Transition Note (Signed)
 Transition of Care [facility]) - Discharge Note   Patient Details  Name: Donny Heffern MRN: [id number] Date of Birth: Sep 14, 1950  Transition of [facility]) CM/SW Contact:  Alfonso Rummer, LCSW Phone Number: 08/09/2024, 1:06 PM   Clinical Narrative:     KEN DELENA Rummer visit pt at bedside in room 239B to inform he will transition to [facility]. Pt son Jivan Symanski will transport pt via family vehicle. Mr. Goodlin is in agreement to transition to [facility]. Mr Tomasik will engage in outpatient dialysis at [facility] MWF. No further TOC needs at this time.     Barriers to Discharge: Continued Medical Work up   Patient Goals and CMS Choice            Discharge Placement                       Discharge Plan and Services Additional resources added to the After Visit Summary for                                       Social Drivers of Health (SDOH) Interventions SDOH Screenings   Food Insecurity: No Food Insecurity (07/29/2024)  Housing: Unknown (07/29/2024)  Transportation Needs: No Transportation Needs (07/29/2024)  Utilities: Not At Risk (07/29/2024)  Depression (PHQ2-9): Low Risk (04/24/2024)  Financial Resource Strain: Low Risk  (07/10/2024)   Received from [facility]  Social Connections: Unknown (07/29/2024)  Tobacco Use: Medium Risk (07/30/2024)     Readmission Risk Interventions     No data to display

## 2024-08-09 NOTE — Discharge Summary (Signed)
 Kenyan Karnes FMW:[id number] DOB: 02/03/1951 DOA: 07/29/2024  PCP: Alla Amis, MD  Admit date: 07/29/2024 Discharge date: 08/09/2024  Time spent: 35 minutes  Recommendations for Outpatient Follow-up:  Hemodialysis m/w/f Pcp f/u after skilled nursing     Discharge Diagnoses:  Principal Problem:   CHF (congestive heart failure) (HCC) Active Problems:   AKI (acute kidney injury)   COPD with acute exacerbation (HCC)   Essential hypertension   Symptomatic anemia   Primary male hypogonadism   Pure hypercholesterolemia   Type 2 diabetes mellitus without complication, without long-term current use of insulin  (HCC)   Personal history of gout   Encounter for dialysis and dialysis catheter care   ESRD (end stage renal disease) (HCC)   Discharge Condition: stable  Diet recommendation: heart healthy  Filed Weights   08/08/24 0536 08/09/24 0524 08/09/24 0730  Weight: 90.9 kg 90.4 kg 88.6 kg    History of present illness:   Eric Browning is a 73 y.o. adult with medical history significant of CAD status post PCI and stenting, chronic HFrEF with LVEF 44%, severe pulmonary hypertension, COPD Gold stage IV, CKD stage IV, OSA on CPAP at bedtime, presented with worsening of cough wheezing shortness of breath.   Symptoms started 9 to 10 days ago when patient started to have increasing exertional dyspnea, cough with occasional clear phlegm, and wheezing.  Last several days, even walking inside his home bedroom causing significant shortness of breath.  For which patient has been using his nocturnal CPAP at daytime to compensate with some relief.  He however did not increase his torsemide  dosage.  He denied any chest pain, no fever or chills.  Over the weekend he has also had decreased appetite but denied any nauseous vomiting, no abdominal pain or diarrhea.  Denied any urinary symptoms.  Hospital Course:   Acute on chronic systolic CHF exacerbation: holding lasix  as pt is now on HD and with  po torsemide . Continue on coreg .     COPD exacerbation: resolved. completed steroid taper, bronchodilators & encourage incentive spirometry   AKI on CKDIV: started on HD on 08/02/24 as per nephro. S/p placement of tunneled HD cath 08/07/24 as per vasc surg . Has outpt chair. Permacath placed 12/10, temp cath removed this morning. Tolerated dialysis today will continue mwf, permacath is functional   HTN: controlled, continue on coreg , imdur , amlodipine , hydralazine     DM2: controlled here. Continue on SSI w/ accuchecks   HLD: continue on statin, zetia     Hx of gout: cont alllop   Obesity: BMI 32.5. Would benefit from weight loss  Procedures: Hemodialysis, temp cath placement, permacath placement  Consultations: Nephrology, vascular surgery  Discharge Exam: Vitals:   08/09/24 0830 08/09/24 0900  BP: 130/62 (!) 144/75  Pulse: 75 67  Resp: 19 18  Temp:    SpO2: 100% 100%    General: NAD Cardiovascular: RRR Respiratory: CTAB  Discharge Instructions   Discharge Instructions     Increase activity slowly   Complete by: As directed       Allergies as of 08/09/2024   No Known Allergies      Medication List     STOP taking these medications    Colchicine  0.6 MG Caps   dapagliflozin propanediol 10 MG Tabs tablet Commonly known as: FARXIGA   hydrochlorothiazide  25 MG tablet Commonly known as: HYDRODIURIL    metFORMIN  500 MG 24 hr tablet Commonly known as: GLUCOPHAGE -XR   metoprolol  succinate 25 MG 24 hr tablet Commonly known as: TOPROL -XL  Ohtuvayre  3 MG/2.5ML Susp Generic drug: Ensifentrine    olmesartan  40 MG tablet Commonly known as: BENICAR    Plavix 75 MG tablet Generic drug: clopidogrel   potassium chloride  SA 20 MEQ tablet Commonly known as: KLOR-CON  M       TAKE these medications    albuterol  108 (90 Base) MCG/ACT inhaler Commonly known as: VENTOLIN  HFA Inhale 1-2 puffs into the lungs every 6 (six) hours as needed for wheezing or  shortness of breath.   allopurinol  100 MG tablet Commonly known as: ZYLOPRIM  Take 1 tablet (100 mg total) by mouth every Monday, Wednesday, and Friday with hemodialysis. What changed: when to take this   amLODipine  5 MG tablet Commonly known as: NORVASC  Take 5 mg by mouth daily. What changed: Another medication with the same name was removed. Continue taking this medication, and follow the directions you see here.   aspirin  EC 81 MG tablet Take 81 mg by mouth daily. Swallow whole.   carvedilol  12.5 MG tablet Commonly known as: COREG  Take 12.5 mg by mouth 2 (two) times daily.   cyanocobalamin  500 MCG tablet Commonly known as: VITAMIN B12 Take 500 mcg by mouth daily.   ezetimibe  10 MG tablet Commonly known as: ZETIA  Take 10 mg by mouth daily.   GENTLE IRON PO Take 2 tablets by mouth daily.   hydrALAZINE  25 MG tablet Commonly known as: APRESOLINE  Take 25 mg by mouth.  Take 1 tablet (25 mg total) by mouth in the morning and 1 tablet (25 mg total) at noon and 1 tablet (25 mg total) in the evening.   isosorbide  mononitrate 30 MG 24 hr tablet Commonly known as: IMDUR  Take 1 tablet (30 mg total) by mouth daily.   NON FORMULARY Pt uses a cpap nightly   pravastatin  20 MG tablet Commonly known as: PRAVACHOL  Take 20 mg by mouth daily.   tamsulosin  0.4 MG Caps capsule Commonly known as: FLOMAX  Take 0.4 mg by mouth daily.   torsemide  20 MG tablet Commonly known as: DEMADEX  Take 40 mg by mouth daily.       Allergies[1]  Contact information for follow-up providers     Dialysis, [facility]  Follow up on 08/09/2024.   Why: Patient Dialysis days are: Monday, Wednesday, Friday with a chair time of 7:15am. Pt first day would need to arrive at 6:30am for new patient paperwork. Contact information: [address] [place] [address] [phone]         Alla Amis, MD Follow up.   Specialty: Family Medicine Contact information: [address] [facility] [place] [address] [phone]              Contact information for after-discharge care     Destination     [facility] .   Service: Skilled Nursing Contact information: [address] [place]  [address] [phone]                      The results of significant diagnostics from this hospitalization (including imaging, microbiology, ancillary and laboratory) are listed below for reference.    Significant Diagnostic Studies: PERIPHERAL VASCULAR CATHETERIZATION Result Date: 08/07/2024 See surgical note for result.  PERIPHERAL VASCULAR CATHETERIZATION Result Date: 08/02/2024 See surgical note for result.  US  RENAL Result Date: 07/30/2024 EXAM: US  Retroperitoneum Complete, Renal. 07/30/2024 06:38:43 PM TECHNIQUE: Real-time ultrasonography of the retroperitoneum renal was performed. COMPARISON: None available CLINICAL HISTORY: [id number] AKI (acute kidney injury) 409830 AKI (acute kidney  injury) FINDINGS: FINDINGS: RIGHT KIDNEY/URETER: Right kidney measures 10.7 cm in length. Cystic mass is in the right kidney without internal vascularity measuring up to 2.8 cm. Normal cortical echogenicity. No hydronephrosis. No calculus. No mass. LEFT KIDNEY/URETER: Left kidney measures 12.0 cm in length. Echogenic mass in the left kidney upper pole measuring 3.2 x 2.5 x 3.0 cm without internal vascularity. This may be related to post ablation changes. Additional simple cysts in the left kidney. Normal cortical echogenicity. No hydronephrosis. No calculus. No mass. BLADDER: Thickened bladder wall. IMPRESSION: 1. No acute renal abnormality. 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 These were better evaluated with MRI abdomen 8 / 29 / 25. 2. Thickened bladder wall. Correlate for cystitis. Electronically signed by: Norman Gatlin MD 07/30/2024 08:53 PM EST RP Workstation: [id number]   DG Chest 1  View Result Date: 07/30/2024 EXAM: 1 VIEW(S) XRAY OF THE CHEST 07/30/2024 07:10:55 AM COMPARISON: 07/29/2024 CLINICAL HISTORY: 73 year old male with CHF (congestive heart failure) (HCC). FINDINGS: LUNGS AND PLEURA: Larger lung volumes and bilateral lung base ventilation has improved. Small bilateral pleural effusions. Perihilar interstitial opacities, stable. Left retrocardiac opacity persists. No pneumothorax. HEART AND MEDIASTINUM: Cardiomegaly unchanged. No acute abnormality of the mediastinal silhouette. BONES AND SOFT TISSUES: Right shoulder arthroplasty stable. IMPRESSION: 1. Larger lung volumes with improved bibasilar ventilation. Regressed but not resolved pulmonary edema and small pleural effusions. Electronically signed by: Helayne Hurst MD 07/30/2024 07:26 AM EST RP Workstation: [id number]   DG Chest Portable 1 View Result Date: 07/29/2024 CLINICAL DATA:  dyspnea EXAM: PORTABLE CHEST - 1 VIEW COMPARISON:  12/09/2016 FINDINGS: Lower lung volumes. Bilateral perihilar interstitial opacities with patchy opacities in the lung bases. No pneumothorax. Blunting of both costophrenic sulci, likely trace effusions. Moderate cardiomegaly.Tortuous aorta with aortic atherosclerosis.No acute fracture or destructive lesion. IMPRESSION: Moderate cardiomegaly. Low lung volumes with findings of either asymmetric pulmonary edema or aspiration. Trace bilateral pleural effusions Electronically Signed   By: Rogelia Myers M.D.   On: 07/29/2024 08:52    Microbiology: No results found for this or any previous visit (from the past 240 hours).   Labs: Basic Metabolic Panel: Recent Labs  Lab 08/03/24 0853 08/05/24 0407 08/06/24 0356 08/07/24 0512 08/09/24 0745  NA 137 138 137 137 138  K 4.6 4.5 4.1 4.0 3.8  CL 100 101 100 100 102  CO2 25 25 26 26 28   GLUCOSE 99 120* 120* 85 103*  BUN 107* 102* 62* 78* 69*  CREATININE 5.35* 5.42* 3.61* 4.37* 4.26*  CALCIUM  7.5* 8.2* 7.7* 8.0* 8.5*  PHOS 7.7*  --   --   --   4.7*   Liver Function Tests: Recent Labs  Lab 08/03/24 0853 08/09/24 0745  ALBUMIN 3.3* 3.3*   No results for input(s): LIPASE, AMYLASE in the last 168 hours. No results for input(s): AMMONIA in the last 168 hours. CBC: Recent Labs  Lab 08/02/24 1531 08/03/24 0853 08/09/24 0745  WBC 9.3 10.0 13.1*  HGB 9.0* 9.2* 9.0*  HCT 28.7* 29.5* 29.1*  MCV 84.4 85.5 85.3  PLT 234 214 201   Cardiac Enzymes: No results for input(s): CKTOTAL, CKMB, CKMBINDEX, TROPONINI in the last 168 hours. BNP: BNP (last 3 results) No results for input(s): BNP in the last 8760 hours.  ProBNP (last 3 results) Recent Labs    07/29/24 0800  PROBNP 7,322.0*    CBG: Recent Labs  Lab 08/07/24 2133 08/08/24 0731 08/08/24 1134 08/08/24 1615 08/08/24 2118  GLUCAP 141* 105* 138*  165* 149*       Signed:  Devaughn KATHEE Ban MD.  [facility] 08/09/2024, 9:11 AM     [1] No Known Allergies

## 2024-08-09 NOTE — Progress Notes (Signed)
 [facility]  ROUNDING NOTE   Subjective:   Eric Browning is a 73 y.o. male with past medical history of diabetes, peripheral vascular disease, hypertension, congestive heart failure, hyperlipidemia, gout, BPH and chronic kidney disease with anemia and proteinuria. Patient presents to ED with shortness of breath and has been admitted for CHF (congestive heart failure) (HCC) [I50.9] SOB (shortness of breath) [R06.02] Hypoxia [R09.02] Acute on chronic systolic congestive heart failure (HCC) [I50.23] Acute respiratory failure with hypoxia and hypercarbia (HCC) [J96.01, J96.02]  Patient is known to our practice and was seen by Dr Dennise in October.   Patient seen during dialysis Tolerating well    HEMODIALYSIS FLOWSHEET:  Blood Flow Rate (mL/min): 400 mL/min Arterial Pressure (mmHg): -163.63 mmHg Venous Pressure (mmHg): 149.08 mmHg TMP (mmHg): 4.85 mmHg Ultrafiltration Rate (mL/min): 686 mL/min Dialysate Flow Rate (mL/min): 299 ml/min Bolus Amount (mL): 100 mL   HD sitting up in chair  Objective:  Vital signs in last 24 hours:  Temp:  [97.6 F (36.4 C)-98.2 F (36.8 C)] 97.6 F (36.4 C) (12/12 0730) Pulse Rate:  [57-84] 67 (12/12 0900) Resp:  [16-21] 18 (12/12 0900) BP: (110-152)/(60-75) 144/75 (12/12 0900) SpO2:  [98 %-100 %] 100 % (12/12 0900) Weight:  [88.6 kg-90.4 kg] 88.6 kg (12/12 0730)  Weight change: -3.543 kg Filed Weights   08/08/24 0536 08/09/24 0524 08/09/24 0730  Weight: 90.9 kg 90.4 kg 88.6 kg    Intake/Output: I/O last 3 completed shifts: In: 840 [P.O.:840] Out: -    Intake/Output this shift:  No intake/output data recorded.  Physical Exam: General: NAD  Head: Normocephalic, atraumatic. Moist oral mucosal membranes  Eyes: Anicteric  Lungs:  Clear to auscultation, normal effort  Heart: Regular rate and rhythm  Abdomen:  Soft, nontender  Extremities:  1+ peripheral edema.  Neurologic: Awake, alert, conversant  Skin: Warm,dry, no rash   Access  Rt internal jugular permcath    Basic Metabolic Panel: Recent Labs  Lab 08/03/24 0853 08/05/24 0407 08/06/24 0356 08/07/24 0512 08/09/24 0745  NA 137 138 137 137 138  K 4.6 4.5 4.1 4.0 3.8  CL 100 101 100 100 102  CO2 25 25 26 26 28   GLUCOSE 99 120* 120* 85 103*  BUN 107* 102* 62* 78* 69*  CREATININE 5.35* 5.42* 3.61* 4.37* 4.26*  CALCIUM  7.5* 8.2* 7.7* 8.0* 8.5*  PHOS 7.7*  --   --   --  4.7*    Liver Function Tests: Recent Labs  Lab 08/03/24 0853 08/09/24 0745  ALBUMIN 3.3* 3.3*   No results for input(s): LIPASE, AMYLASE in the last 168 hours. No results for input(s): AMMONIA in the last 168 hours.  CBC: Recent Labs  Lab 08/02/24 1531 08/03/24 0853 08/09/24 0745  WBC 9.3 10.0 13.1*  HGB 9.0* 9.2* 9.0*  HCT 28.7* 29.5* 29.1*  MCV 84.4 85.5 85.3  PLT 234 214 201    Cardiac Enzymes: No results for input(s): CKTOTAL, CKMB, CKMBINDEX, TROPONINI in the last 168 hours.  BNP: Invalid input(s): POCBNP  CBG: Recent Labs  Lab 08/07/24 2133 08/08/24 0731 08/08/24 1134 08/08/24 1615 08/08/24 2118  GLUCAP 141* 105* 138* 165* 149*    Microbiology: Results for orders placed or performed during the hospital encounter of 07/29/24  Resp panel by RT-PCR (RSV, Flu A&B, Covid) Anterior Nasal Swab     Status: None   Collection Time: 07/29/24  7:57 AM   Specimen: Anterior Nasal Swab  Result Value Ref Range Status   SARS Coronavirus 2 by  RT PCR NEGATIVE NEGATIVE Final    Comment: (NOTE) SARS-CoV-2 target nucleic acids are NOT DETECTED.  The SARS-CoV-2 RNA is generally detectable in upper respiratory specimens during the acute phase of infection. The lowest concentration of SARS-CoV-2 viral copies this assay can detect is 138 copies/mL. A negative result does not preclude SARS-Cov-2 infection and should not be used as the sole basis for treatment or other patient management decisions. A negative result may occur with  improper specimen  collection/handling, submission of specimen other than nasopharyngeal swab, presence of viral mutation(s) within the areas targeted by this assay, and inadequate number of viral copies(<138 copies/mL). A negative result must be combined with clinical observations, patient history, and epidemiological information. The expected result is Negative.  Fact Sheet for Patients:  bloggercourse.com  Fact Sheet for Healthcare Providers:  seriousbroker.it  This test is no t yet approved or cleared by the [place]  FDA and  has been authorized for detection and/or diagnosis of SARS-CoV-2 by FDA under an Emergency Use Authorization (EUA). This EUA will remain  in effect (meaning this test can be used) for the duration of the COVID-19 declaration under Section 564(b)(1) of the Act, 21 U.S.C.section 360bbb-3(b)(1), unless the authorization is terminated  or revoked sooner.       Influenza A by PCR NEGATIVE NEGATIVE Final   Influenza B by PCR NEGATIVE NEGATIVE Final    Comment: (NOTE) The Xpert Xpress SARS-CoV-2/FLU/RSV plus assay is intended as an aid in the diagnosis of influenza from Nasopharyngeal swab specimens and should not be used as a sole basis for treatment. Nasal washings and aspirates are unacceptable for Xpert Xpress SARS-CoV-2/FLU/RSV testing.  Fact Sheet for Patients: bloggercourse.com  Fact Sheet for Healthcare Providers: seriousbroker.it  This test is not yet approved or cleared by the [place]  FDA and has been authorized for detection and/or diagnosis of SARS-CoV-2 by FDA under an Emergency Use Authorization (EUA). This EUA will remain in effect (meaning this test can be used) for the duration of the COVID-19 declaration under Section 564(b)(1) of the Act, 21 U.S.C. section 360bbb-3(b)(1), unless the authorization is terminated or revoked.     Resp Syncytial  Virus by PCR NEGATIVE NEGATIVE Final    Comment: (NOTE) Fact Sheet for Patients: bloggercourse.com  Fact Sheet for Healthcare Providers: seriousbroker.it  This test is not yet approved or cleared by the [place]  FDA and has been authorized for detection and/or diagnosis of SARS-CoV-2 by FDA under an Emergency Use Authorization (EUA). This EUA will remain in effect (meaning this test can be used) for the duration of the COVID-19 declaration under Section 564(b)(1) of the Act, 21 U.S.C. section 360bbb-3(b)(1), unless the authorization is terminated or revoked.  Performed at [facility], [address] [place], [place] [address]     Coagulation Studies: No results for input(s): LABPROT, INR in the last 72 hours.   Urinalysis: No results for input(s): COLORURINE, LABSPEC, PHURINE, GLUCOSEU, HGBUR, BILIRUBINUR, KETONESUR, PROTEINUR, UROBILINOGEN, NITRITE, LEUKOCYTESUR in the last 72 hours.  Invalid input(s): APPERANCEUR    Imaging: PERIPHERAL VASCULAR CATHETERIZATION Result Date: 08/07/2024 See surgical note for result.     Medications:      (feeding supplement) PROSource Plus  30 mL Oral BID BM   allopurinol   100 mg Oral Q M,W,F-HD   amLODipine   5 mg Oral Daily   aspirin  EC  81 mg Oral Daily   carvedilol   12.5 mg Oral BID   Chlorhexidine  Gluconate Cloth  6 each Topical Q0600  epoetin  alfa-epbx (RETACRIT ) injection  4,000 Units Intravenous Q M,W,F-1800   ezetimibe   10 mg Oral Daily   fluticasone  furoate-vilanterol  1 puff Inhalation Daily   guaiFENesin   1,200 mg Oral BID   heparin   5,000 Units Subcutaneous Q12H   hydrALAZINE   25 mg Oral Q8H   insulin  aspart  0-9 Units Subcutaneous TID WC   isosorbide  mononitrate  30 mg Oral Daily   multivitamin  1 tablet Oral QHS   pravastatin   20 mg Oral QHS   tamsulosin   0.4 mg Oral Daily   torsemide   40 mg Oral Daily    acetaminophen , albuterol , alteplase , heparin , ondansetron  (ZOFRAN ) IV, mouth rinse  Assessment/ Plan:  Eric Browning is a 73 y.o.  adult with past medical history of diabetes, peripheral vascular disease, hypertension, congestive heart failure, hyperlipidemia, gout, BPH and chronic kidney disease with anemia and proteinuria. Patient presents to ED with shortness of breath and has been admitted for CHF (congestive heart failure) (HCC) [I50.9] SOB (shortness of breath) [R06.02] Hypoxia [R09.02] Acute on chronic systolic congestive heart failure (HCC) [I50.23] Acute respiratory failure with hypoxia and hypercarbia (HCC) [J96.01, J96.02]   Acute Kidney Injury on chronic kidney disease stage 4 with baseline creatinine 3.0 and GFR of 21 on 07/03/2024. Acute kidney injury secondary to volume overload. Failed IV diuresis.   First dialysis treatment received on 12/5. Tolerating HD well.  Has been accepted to [facility] on a MWF schedule.   Ok to d/c from renal standpoint.    Lab Results  Component Value Date   CREATININE 4.26 (H) 08/09/2024   CREATININE 4.37 (H) 08/07/2024   CREATININE 3.61 (H) 08/06/2024    Intake/Output Summary (Last 24 hours) at 08/09/2024 0930 Last data filed at 08/08/2024 1916 Gross per 24 hour  Intake 840 ml  Output --  Net 840 ml   2. Chronic systolic heart failure , LE edema ECHO completed on 06/07/24 shows EF 40%.  - Continue Torsemide  40mg  daily  3. Anemia of chronic kidney disease  Lab Results  Component Value Date   HGB 9.0 (L) 08/09/2024   -Continue low dose retacrit  with dialysis.   4. Hypertension with chronic kidney disease. Receiving amlodipine , carvedilol , hydralazine , and isosorbide . Blood pressure stable   LOS: 11 Rexine Gowens 12/12/20259:30 AM

## 2024-08-09 NOTE — TOC Progression Note (Signed)
 Transition of Care [facility]) - Progression Note    Patient Details  Name: Eric Browning MRN: [id number] Date of Birth: 11-24-1950  [facility]) CM/SW Contact  Alfonso Rummer, LCSW Phone Number: 08/09/2024, 8:40 AM  Clinical Narrative:     [name] will transition to [facility] 08/09/24 after dialysis.   Expected Discharge Plan: Home/Self Care Barriers to Discharge: Continued Medical Work up               Expected Discharge Plan and Services                                               Social Drivers of Health (SDOH) Interventions SDOH Screenings   Food Insecurity: No Food Insecurity (07/29/2024)  Housing: Unknown (07/29/2024)  Transportation Needs: No Transportation Needs (07/29/2024)  Utilities: Not At Risk (07/29/2024)  Depression (PHQ2-9): Low Risk (04/24/2024)  Financial Resource Strain: Low Risk  (07/10/2024)   Received from [facility]  Social Connections: Unknown (07/29/2024)  Tobacco Use: Medium Risk (07/30/2024)    Readmission Risk Interventions     No data to display

## 2024-08-29 NOTE — Progress Notes (Signed)
 Established Patient Visit   Chief Complaint: Chief Complaint  Patient presents with   Atrial fibrillation, unspecified type    Follow-up   Date of Service: 08/27/2024 Date of Birth: September 08, 1950 PCP: Alla Amis, MD  History of Present Illness: [name] is a 74 y.o.male patient who recently had renal failure had had renal insufficiency over the years but now is on hemodialysis 3 days a week he states he feels much better Monday Wednesday Friday he has a history of obstructive sleep apnea on CPAP pulm emphysema CHF systolic dysfunction coronary artery disease shortness of breath anemia hyperlipidemia overall left ventricular function is 45% with previous PCI and stent with DES atrial fibrillation doing reasonably well since his recent hospitalization  Past Medical and Surgical History  Past Medical History Past Medical History:  Diagnosis Date   Cataracts, bilateral    Followed by [facility]   Diabetes mellitus type 2, uncomplicated (CMS/HHS-HCC)    AODM 6.5%- 10/2012   DOE (dyspnea on exertion)    Gout    History of hypokalemia    K 3.6- 10/2012   Hyperlipidemia    LDL 73- 10/2012   Hypertension    Obesity    Primary male hypogonadism    57- 11/2012; follows with Dr. Damian    Past Surgical History He has a past surgical history that includes colonoscopy (06/29/2012); Colonoscopy (08/03/2015); Right TKA using all-cemented biomet vangaurd system with a 70 mm pcr femur and a 79mm tibial tray with a 10 mm e-poly insert and a 37 x 10 mm all-poly 3 pegged domed patella  (Right, 05/12/2016); REVERSE RIGHT TOTAL SHOULDER ARTHROPLASTY (Right, 12/12/2017); Cardiac catheterization; and Colon @ [facility] (04/06/2022).   Medications and Allergies  Current Medications  Current Outpatient Medications  Medication Sig Dispense Refill   acetaminophen  (TYLENOL ) 500 MG tablet Take 1,000 mg by mouth as needed for Pain     albuterol  90 mcg/actuation inhaler Inhale 2 inhalations  into the lungs every 6 (six) hours as needed 1 each 1   allopurinoL  (ZYLOPRIM ) 100 MG tablet TAKE 1 TABLET BY MOUTH ONCE  DAILY 100 tablet 1   amLODIPine  (NORVASC ) 5 MG tablet TAKE 1 TABLET BY MOUTH ONCE  DAILY 100 tablet 1   aspirin  81 MG EC tablet Take 81 mg by mouth once daily.     carvediloL  (COREG ) 12.5 MG tablet Take 1 tablet (12.5 mg total) by mouth 2 (two) times daily with meals 60 tablet 11   clopidogreL (PLAVIX) 75 mg tablet TAKE 1 TABLET BY MOUTH DAILY 60 tablet 5   colchicine  (COLCRYS ) 0.6 mg tablet Take 2 tablets then 1 tab 1 hr later for acute gout flare-ups; take only as needed for acute gout flare-ups 12 tablet 0   cyanocobalamin  (VITAMIN B12) 1000 MCG tablet Take 1,000 mcg by mouth once daily     dapagliflozin propanediol (FARXIGA) 10 mg tablet Take 10 mg by mouth once daily     ezetimibe  (ZETIA ) 10 mg tablet Take 1 tablet (10 mg total) by mouth once daily 30 tablet 11   ferrous sulfate 325 (65 FE) MG tablet Take 325 mg by mouth daily with breakfast     hydrALAZINE  (APRESOLINE ) 25 MG tablet Take 25 mg by mouth 3 (three) times daily     isosorbide  mononitrate (IMDUR ) 30 MG ER tablet Take 30 mg by mouth once daily     OHTUVAYRE  3 mg/2.5 mL NbSp      olmesartan  (BENICAR ) 40 MG tablet TAKE 1 TABLET BY MOUTH  ONCE  DAILY 100 tablet 2   pravastatin  (PRAVACHOL ) 20 MG tablet TAKE 1 TABLET BY MOUTH ONCE  DAILY 100 tablet 2   tamsulosin  (FLOMAX ) 0.4 mg capsule TAKE 1 CAPSULE BY MOUTH ONCE  DAILY 1/2 HOUR AFTER SAME MEAL  EVERY DAY 100 capsule 1   TORsemide  (DEMADEX ) 20 MG tablet Take 2 tablets (40 mg total) by mouth once daily 100 tablet 1   blood glucose diagnostic test strip Use 1 each (1 strip total) once daily Use as instructed. Dx E11.69 True Metrix 100 each 1   blood glucose meter kit Use as directed True Metrix Dx E11.69 1 each 0   lancing device with lancets kit Use 1 each once daily Use as instructed. True Metrix Dx E11.69 100 each 1   No current  facility-administered medications for this visit.    Allergies: Patient has no known allergies.  Social and Family History  Social History  reports that he quit smoking about 36 years ago. His smoking use included cigarettes. He started smoking about 66 years ago. He has a 21 pack-year smoking history. He has been exposed to tobacco smoke. He has never used smokeless tobacco. He reports that he does not drink alcohol and does not use drugs.  Family History Family History  Problem Relation Name Age of Onset   Myocardial Infarction (Heart attack) Mother     Stroke Father     Stroke Brother      Review of Systems   Review of Systems: The patient denies chest pain, shortness of breath, orthopnea, paroxysmal nocturnal dyspnea, pedal edema, palpitations, heart racing, presyncope, syncope. Review of 12 Systems is negative except as described above.  Physical Examination   Vitals:BP 136/68   Pulse 66   Ht 167.6 cm (5' 6)   Wt 87.5 kg (193 lb)   SpO2 95%   BMI 31.15 kg/m  Ht:167.6 cm (5' 6) Wt:87.5 kg (193 lb) ADJ:Anib surface area is 2.02 meters squared. Body mass index is 31.15 kg/m.  HEENT: Pupils equally reactive to light and accomodation  Neck: Supple without thyromegaly, carotid pulses 2+ Lungs: clear to auscultation bilaterally; no wheezes, rales, rhonchi Heart: Regular rate and rhythm.  No gallops, murmurs or rub Abdomen: soft nontender, nondistended, with normal bowel sounds Extremities: no cyanosis, clubbing, or edema Peripheral Pulses: 2+ in all extremities, 2+ femoral pulses bilaterally Neurologic: Alert and oriented X3; speech intact; face symmetrical; moves all extremities well  Assessment   74 y.o. male with  1. Atrial fibrillation, unspecified type (CMS/HHS-HCC)   2. Congestive heart failure, unspecified HF chronicity, unspecified heart failure type (CMS/HHS-HCC)   3. Chronic kidney disease due to type 2 diabetes mellitus (CMS/HHS-HCC)   4. Systolic  dysfunction, left ventricle (EF 45% - ECHO 01/08/19)   5. Chronic systolic CHF (congestive heart failure) (CMS/HHS-HCC)   6. SOB (shortness of breath)   7. Ischemic cardiomyopathy   8. DOE (dyspnea on exertion)   9. Coronary artery disease involving native coronary artery of native heart without angina pectoris   10. S/P right coronary artery (RCA) stent placement   11. Essential hypertension   12. Obesity (BMI 30.0-34.9)   13. OSA (obstructive sleep apnea)   14. Non-insulin  dependent type 2 diabetes mellitus (CMS/HHS-HCC)        Plan  End-stage renal disease on dialysis continue to follow-up with nephrology he sees Dr. Dennise continue hemodialysis 3 times a week Monday Wednesday Friday Ischemic cardiomyopathy ejection fraction 45% previous PCI and stent in the past  with DES HFrEF EF of 45% recommend GDMT for HFrEF Coreg  Farxiga olmesartan  torsemide  will defer spironolactone or MRA to nephrology Shortness of breath related to heart failure cardiomyopathy renal insufficiency COPD continue inhalers continue blood pressure control continue diuretics as well as hemodialysis Obesity modest weight loss exercise he is lost 6 recommend weight weight today's 193 used to be above 258 pounds Hypertension continue blood pressure control with amlodipine  Coreg  hydralazine  Imdur  olmesartan  blood pressure today 136/68 goal is less than 130/80 Obstructive sleep apnea sleep study CPAP weight loss Diabetes continue diabetes management currently on Farxiga A1c goal is less than 7 Hyperlipidemia continue Pravachol  therapy for lipid management LDL goal should be less than 55 History of PCI and stent to RCA     Return in about 6 months (around 02/25/2025).  DWAYNE D CALLWOOD, MD  This dictation was prepared with dragon dictation.  Any transcription errors that result from this process are unintentional.

## 2024-09-03 ENCOUNTER — Inpatient Hospital Stay

## 2024-09-03 ENCOUNTER — Inpatient Hospital Stay: Attending: Internal Medicine

## 2024-09-03 ENCOUNTER — Inpatient Hospital Stay: Admitting: Internal Medicine

## 2024-09-03 ENCOUNTER — Encounter: Payer: Self-pay | Admitting: Internal Medicine

## 2024-09-03 VITALS — BP 109/66 | HR 70 | Temp 97.8°F | Resp 19 | Ht 67.0 in | Wt 189.5 lb

## 2024-09-03 DIAGNOSIS — D649 Anemia, unspecified: Secondary | ICD-10-CM

## 2024-09-03 LAB — IRON AND TIBC
Iron: 61 ug/dL (ref 45–182)
Saturation Ratios: 19 % (ref 17.9–39.5)
TIBC: 328 ug/dL (ref 250–450)
UIBC: 267 ug/dL

## 2024-09-03 LAB — BASIC METABOLIC PANEL - CANCER CENTER ONLY
Anion gap: 14 (ref 5–15)
BUN: 26 mg/dL — ABNORMAL HIGH (ref 8–23)
CO2: 25 mmol/L (ref 22–32)
Calcium: 8.8 mg/dL — ABNORMAL LOW (ref 8.9–10.3)
Chloride: 103 mmol/L (ref 98–111)
Creatinine: 3.92 mg/dL — ABNORMAL HIGH (ref 0.61–1.24)
GFR, Estimated: 15 mL/min — ABNORMAL LOW
Glucose, Bld: 96 mg/dL (ref 70–99)
Potassium: 3.4 mmol/L — ABNORMAL LOW (ref 3.5–5.1)
Sodium: 141 mmol/L (ref 135–145)

## 2024-09-03 LAB — CBC WITH DIFFERENTIAL (CANCER CENTER ONLY)
Abs Immature Granulocytes: 0.03 K/uL (ref 0.00–0.07)
Basophils Absolute: 0 K/uL (ref 0.0–0.1)
Basophils Relative: 1 %
Eosinophils Absolute: 0.1 K/uL (ref 0.0–0.5)
Eosinophils Relative: 1 %
HCT: 29.4 % — ABNORMAL LOW (ref 39.0–52.0)
Hemoglobin: 9.1 g/dL — ABNORMAL LOW (ref 13.0–17.0)
Immature Granulocytes: 0 %
Lymphocytes Relative: 19 %
Lymphs Abs: 1.3 K/uL (ref 0.7–4.0)
MCH: 26.5 pg (ref 26.0–34.0)
MCHC: 31 g/dL (ref 30.0–36.0)
MCV: 85.7 fL (ref 80.0–100.0)
Monocytes Absolute: 0.8 K/uL (ref 0.1–1.0)
Monocytes Relative: 12 %
Neutro Abs: 4.5 K/uL (ref 1.7–7.7)
Neutrophils Relative %: 67 %
Platelet Count: 376 K/uL (ref 150–400)
RBC: 3.43 MIL/uL — ABNORMAL LOW (ref 4.22–5.81)
RDW: 15.9 % — ABNORMAL HIGH (ref 11.5–15.5)
WBC Count: 6.7 K/uL (ref 4.0–10.5)
nRBC: 0 % (ref 0.0–0.2)

## 2024-09-03 LAB — FERRITIN: Ferritin: 34 ng/mL (ref 24–336)

## 2024-09-03 NOTE — Progress Notes (Signed)
 Patient would like to know how his numbers are looking. Also having right ankle pain at night.

## 2024-09-03 NOTE — Progress Notes (Signed)
 [facility] CONSULT NOTE  Patient Care Team: Eric Amis, MD as PCP - General (Family Medicine) Eric Cindy SAUNDERS, MD as Consulting Physician (Oncology)  CHIEF COMPLAINTS/PURPOSE OF CONSULTATION: ANEMIA   HEMATOLOGY HISTORY  # ANEMIA[Hb; MCV-platelets- WBC; Iron sat; ferritin;  GFR- CT/US ; EGD/colonoscopy-  # CKD- [Dr.]  HISTORY OF PRESENTING ILLNESS: Patient ambulating-independently.  Alone.  Eric Browning 74 y.o.  adult pleasant patient with CKD and symptomatic anemia-iron deficiency-dec 2025- ESRD [M/W/F]; Left RCC s/p ablation is here for follow-up.  Discussed the use of AI scribe software for clinical note transcription with the patient, who gave verbal consent to proceed.  History of Present Illness   Eric Browning is a 74 year old male with anemia secondary to end-stage chronic kidney disease who presents for hematology/oncology follow-up regarding anemia management.  He has been receiving hemodialysis for the past three to four weeks, with treatments scheduled on Monday, Wednesday, and Friday. A dialysis catheter is currently in place, with plans to transition to an arteriovenous fistula in the arm.  Hemoglobin is 9.1, stable compared to prior measurements. The patient is on dialysis, and the doctor inferred that he is likely receiving erythropoiesis-stimulating agent injections during dialysis, as is standard practice. He denies new or worsening symptoms of anemia, including chest pain, dyspnea, or palpitations.  He inquired about the purpose of his antihypertensive medication. No additional symptoms or concerns were reported during the visit.      Review of Systems  Constitutional:  Positive for malaise/fatigue. Negative for chills, diaphoresis, fever and weight loss.  HENT:  Negative for nosebleeds and sore throat.   Eyes:  Negative for double vision.  Respiratory:  Negative for cough, hemoptysis, sputum production, shortness of breath and wheezing.    Cardiovascular:  Negative for chest pain, palpitations, orthopnea and leg swelling.  Gastrointestinal:  Negative for abdominal pain, blood in stool, constipation, diarrhea, heartburn, melena, nausea and vomiting.  Genitourinary:  Negative for dysuria, frequency and urgency.  Musculoskeletal:  Negative for back pain and joint pain.  Skin: Negative.  Negative for itching and rash.  Neurological:  Negative for dizziness, tingling, focal weakness, weakness and headaches.  Endo/Heme/Allergies:  Does not bruise/bleed easily.  Psychiatric/Behavioral:  Negative for depression. The patient is not nervous/anxious and does not have insomnia.      MEDICAL HISTORY:  Past Medical History:  Diagnosis Date   Angina at rest    Arthritis of both hands    Bilateral cataracts    Bilateral renal cysts    BPH (benign prostatic hyperplasia)    CAD (coronary artery disease)    a.) LHC/PCI 12/31/2020: 90% dRCA (3.0 x 15 mm Resolute Onyx DES); 50% pRCA, minor lum irregs in LAD and LCx   Cardiomegaly    Cardiomyopathy (HCC)    a.) TTE 07/03/2015: EF 50%; b.) MV 07/03/2015: EF 53%; c.) TTE 01/08/2019: EF 45%; d.) MV 01/08/2019: EF 45-50%; e.) TTE 11/26/2020: EF 45%; f.) MV 11/26/2020: EF 51%; g.) TTE 03/06/2023: EF 40%   CKD (chronic kidney disease), stage III (HCC)    CVA (cerebral vascular accident) (HCC)    a.) noted on CT head 03/12/2017: RIGHT posterior  parietal encephalomalacia consistent with small chronic temporoparietal infarct   DDD (degenerative disc disease), cervical    Dyspnea on exertion    GERD (gastroesophageal reflux disease)    Gout    Heart murmur    HFrEF (heart failure with reduced ejection fraction) (HCC)    a.) TTE 07/03/2015: EF 50%, mild-mod LVH, inf/post HK,  G1DD, triv MR/PR, mild AR/TR, RVSP 36.8; b.) TTE 01/08/2019: EF 45%, sep/apical HK, mild LVH, mild LAE, mild panval regurg, RVSP 37.2; d.) TTE 11/26/2020: EF 45%, apical.inf/post HK, G1DD, mild LAE/LVE, triv AR/PR, mild MR/TR,  RVSP 52.9; e.)  TTE 03/06/2023: EF 40%, sep/apical/inf HK, sev LAE, mild AR/TR/PR, mod MR, RVSP 35.3   Hiatal hernia    High cholesterol    Hypertension    IDA (iron deficiency anemia)    Left renal mass 12/13/2022   a.) US  renal 12/13/2022: 2.7 x 2.4 x 2.5 cm; b.) MRI abdomen 12/22/2022: 2.9 x 2.6 x 2.4 cm LEFT anterior upper kidney; c.) MRI abdomen 06/14/2023: interval increase in size to 2.8 x 3.2 cm --> 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   Long term current use of aspirin     OSA on CPAP    Primary hypogonadism in male    Stenosis of left vertebral artery (severe)    T2DM (type 2 diabetes mellitus) (HCC)     SURGICAL HISTORY: Past Surgical History:  Procedure Laterality Date   CATARACT EXTRACTION W/ INTRAOCULAR LENS IMPLANT Right 2013   COLONOSCOPY     COLONOSCOPY N/A 04/06/2022   Procedure: COLONOSCOPY;  Surgeon: Toledo, Ladell POUR, MD;  Location: [facility] ENDOSCOPY;  Service: Gastroenterology;  Laterality: N/A;   COLONOSCOPY WITH PROPOFOL  N/A 08/03/2015   Procedure: COLONOSCOPY WITH PROPOFOL ;  Surgeon: Lamar ONEIDA Holmes, MD;  Location: [facility] ENDOSCOPY;  Service: Endoscopy;  Laterality: N/A;   CORONARY STENT INTERVENTION N/A 12/31/2020   Procedure: CORONARY STENT INTERVENTION;  Surgeon: Florencio Cara BIRCH, MD;  Location: [facility] INVASIVE CV LAB;  Service: Cardiovascular;  Laterality: N/A;  RCA   DIALYSIS/PERMA CATHETER INSERTION N/A 08/07/2024   Procedure: DIALYSIS/PERMA CATHETER INSERTION;  Surgeon: Marea Selinda RAMAN, MD;  Location: [facility] INVASIVE CV LAB;  Service: Cardiovascular;  Laterality: N/A;   EUS N/A 09/03/2015   Procedure: LOWER ENDOSCOPIC ULTRASOUND (EUS);  Surgeon: Asberry DELENA Coffee, MD;  Location: [facility] ENDOSCOPY;  Service: Endoscopy;  Laterality: N/A;   IR RADIOLOGIST EVAL & MGMT  08/24/2023   IR RADIOLOGIST EVAL & MGMT  09/28/2023   IR RADIOLOGIST EVAL & MGMT  06/06/2024   LEFT HEART CATH AND CORONARY ANGIOGRAPHY N/A 12/31/2020   Procedure: LEFT HEART CATH AND CORONARY  ANGIOGRAPHY;  Surgeon: Florencio Cara BIRCH, MD;  Location: [facility] INVASIVE CV LAB;  Service: Cardiovascular;  Laterality: N/A;   REVERSE SHOULDER ARTHROPLASTY Right 12/12/2017   Procedure: REVERSE SHOULDER ARTHROPLASTY;  Surgeon: Edie Norleen PARAS, MD;  Location: [facility] ORS;  Service: Orthopedics;  Laterality: Right;   RIGHT HEART CATH Right 06/12/2024   Procedure: RIGHT HEART CATH;  Surgeon: Florencio Cara BIRCH, MD;  Location: [facility] INVASIVE CV LAB;  Service: Cardiovascular;  Laterality: Right;   TEMPORARY DIALYSIS CATHETER N/A 08/02/2024   Procedure: TEMPORARY DIALYSIS CATHETER;  Surgeon: Jama Cordella MATSU, MD;  Location: [facility] INVASIVE CV LAB;  Service: Cardiovascular;  Laterality: N/A;   TONSILLECTOMY     TOTAL KNEE ARTHROPLASTY Right 05/12/2016   Procedure: TOTAL KNEE ARTHROPLASTY;  Surgeon: Norleen PARAS Edie, MD;  Location: [facility] ORS;  Service: Orthopedics;  Laterality: Right;    SOCIAL HISTORY: Social History   Socioeconomic History   Marital status: Married    Spouse name: Not on file   Number of children: Not on file   Years of education: Not on file   Highest education level: Not on file  Occupational History   Occupation: works in cigarette factory  Tobacco Use   Smoking status: Former  Current packs/day: 0.00    Average packs/day: 1 pack/day for 30.0 years (30.0 ttl pk-yrs)    Types: Cigarettes    Start date: 09/28/1958    Quit date: 09/28/1988    Years since quitting: 35.9   Smokeless tobacco: Never   Tobacco comments:    quit 1990  Vaping Use   Vaping status: Never Used  Substance and Sexual Activity   Alcohol use: No   Drug use: No   Sexual activity: Yes  Other Topics Concern   Not on file  Social History Narrative   Not on file   Social Drivers of Health   Tobacco Use: Medium Risk (09/03/2024)   Patient History    Smoking Tobacco Use: Former    Smokeless Tobacco Use: Never    Passive Exposure: Not on file  Financial Resource Strain: Low Risk  (07/10/2024)   Received  from [facility]   Overall Financial Resource Strain (CARDIA)    Difficulty of Paying Living Expenses: Not hard at all  Food Insecurity: No Food Insecurity (07/29/2024)   Epic    Worried About Radiation Protection Practitioner of Food in the Last Year: Never true    Ran Out of Food in the Last Year: Never true  Transportation Needs: No Transportation Needs (07/29/2024)   Epic    Lack of Transportation (Medical): No    Lack of Transportation (Non-Medical): No  Physical Activity: Not on file  Stress: Not on file  Social Connections: Unknown (07/29/2024)   Social Connection and Isolation Panel    Frequency of Communication with Friends and Family: More than three times a week    Frequency of Social Gatherings with Friends and Family: More than three times a week    Attends Religious Services: Patient declined    Database Administrator or Organizations: Patient declined    Attends Banker Meetings: Patient declined    Marital Status: Married  Catering Manager Violence: Not At Risk (07/29/2024)   Epic    Fear of Current or Ex-Partner: No    Emotionally Abused: No    Physically Abused: No    Sexually Abused: No  Depression (PHQ2-9): Low Risk (09/03/2024)   Depression (PHQ2-9)    PHQ-2 Score: 0  Alcohol Screen: Not on file  Housing: Unknown (07/29/2024)   Epic    Unable to Pay for Housing in the Last Year: Patient declined    Number of Times Moved in the Last Year: 0    Homeless in the Last Year: No  Utilities: Not At Risk (07/29/2024)   Epic    Threatened with loss of utilities: No  Health Literacy: Not on file    FAMILY HISTORY: Family History  Problem Relation Age of Onset   Heart disease Mother    Stroke Father    Hypertension Father    Gout Father    Cancer Brother    Bone cancer Son    Brain cancer Son     ALLERGIES:  has no known allergies.  MEDICATIONS:  Current Outpatient Medications  Medication Sig Dispense Refill   albuterol  (VENTOLIN  HFA) 108 (90 Base)  MCG/ACT inhaler Inhale 1-2 puffs into the lungs every 6 (six) hours as needed for wheezing or shortness of breath.     allopurinol  (ZYLOPRIM ) 100 MG tablet Take 1 tablet (100 mg total) by mouth every Monday, Wednesday, and Friday with hemodialysis.     amLODipine  (NORVASC ) 5 MG tablet Take 5 mg by mouth daily.  aspirin  EC 81 MG tablet Take 81 mg by mouth daily. Swallow whole.     carvedilol  (COREG ) 12.5 MG tablet Take 12.5 mg by mouth 2 (two) times daily.     cyanocobalamin  (VITAMIN B12) 500 MCG tablet Take 500 mcg by mouth daily.     ezetimibe  (ZETIA ) 10 MG tablet Take 10 mg by mouth daily.     Fe Bisgly-Vit C-Vit B12-FA (GENTLE IRON PO) Take 2 tablets by mouth daily.     furosemide  (LASIX ) 80 MG tablet Take 80 mg by mouth 2 (two) times daily.     hydrALAZINE  (APRESOLINE ) 25 MG tablet Take 25 mg by mouth.  Take 1 tablet (25 mg total) by mouth in the morning and 1 tablet (25 mg total) at noon and 1 tablet (25 mg total) in the evening.     isosorbide  mononitrate (IMDUR ) 30 MG 24 hr tablet Take 1 tablet (30 mg total) by mouth daily.     NON FORMULARY Pt uses a cpap nightly     pravastatin  (PRAVACHOL ) 20 MG tablet Take 20 mg by mouth daily.     tamsulosin  (FLOMAX ) 0.4 MG CAPS capsule Take 0.4 mg by mouth daily.     torsemide  (DEMADEX ) 20 MG tablet Take 40 mg by mouth daily.     No current facility-administered medications for this visit.     PHYSICAL EXAMINATION:   Vitals:   09/03/24 1506  BP: 109/66  Pulse: 70  Resp: 19  Temp: 97.8 F (36.6 C)  SpO2: 98%    Filed Weights   09/03/24 1506  Weight: 189 lb 8 oz (86 kg)     Physical Exam Vitals and nursing note reviewed.  HENT:     Head: Normocephalic and atraumatic.     Mouth/Throat:     Pharynx: Oropharynx is clear.  Eyes:     Extraocular Movements: Extraocular movements intact.     Pupils: Pupils are equal, round, and reactive to light.  Cardiovascular:     Rate and Rhythm: Normal rate and regular rhythm.     Heart  sounds: Murmur heard.  Pulmonary:     Comments: Decreased breath sounds bilaterally.  Abdominal:     Palpations: Abdomen is soft.  Musculoskeletal:        General: Normal range of motion.     Cervical back: Normal range of motion.  Skin:    General: Skin is warm.  Neurological:     General: No focal deficit present.     Mental Status: He is alert and oriented to person, place, and time.  Psychiatric:        Behavior: Behavior normal.        Judgment: Judgment normal.      LABORATORY DATA:  I have reviewed the data as listed Lab Results  Component Value Date   WBC 6.7 09/03/2024   HGB 9.1 (L) 09/03/2024   HCT 29.4 (L) 09/03/2024   MCV 85.7 09/03/2024   PLT 376 09/03/2024   Recent Labs    09/11/23 0811 12/18/23 1332 04/24/24 1409 06/12/24 1247 07/29/24 0800 07/30/24 0437 08/02/24 0908 08/03/24 0853 08/05/24 0407 08/07/24 0512 08/09/24 0745 09/03/24 1431  NA 139   < > 137   < > 147*   < > 137 137   < > 137 138 141  K 3.3*   < > 3.2*   < > 4.1   < > 4.7 4.6   < > 4.0 3.8 3.4*  CL 99   < > 101  --  109   < > 101 100   < > 100 102 103  CO2 27   < > 26  --  28   < > 21* 25   < > 26 28 25   GLUCOSE 123*   < > 109*  --  130*   < > 122* 99   < > 85 103* 96  BUN 43*   < > 42*  --  36*   < > 118* 107*   < > 78* 69* 26*  CREATININE 2.40*   < > 3.00*  --  2.90*   < > 5.55* 5.35*   < > 4.37* 4.26* 3.92*  CALCIUM  9.4   < > 8.7*  --  9.4   < > 7.8* 7.5*   < > 8.0* 8.5* 8.8*  GFRNONAA 28*   < > 21*  --  22*   < > 10* 10*   < > 13* 14* 15*  PROT 6.3*  --  6.3*  --  7.1  --   --   --   --   --   --   --   ALBUMIN 3.8  --  3.7  --  4.2  --  3.7 3.3*  --   --  3.3*  --   AST 12*  --  14*  --  11*  --   --   --   --   --   --   --   ALT 9  --  11  --  7  --   --   --   --   --   --   --   ALKPHOS 62  --  64  --  98  --   --   --   --   --   --   --   BILITOT 0.5  --  0.5  --  0.3  --   --   --   --   --   --   --    < > = values in this interval not displayed.     PERIPHERAL  VASCULAR CATHETERIZATION Result Date: 08/07/2024 See surgical note for result.   ASSESSMENT & PLAN:   Symptomatic anemia # Chronic anemia-[AUG 2024-Hb 8.8; MCV- 76] microcytic recently getting worse.  Interestingly patient is ONLY mildly symptomatic. 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  # Today hemoglobin is 9.- slow trend down- but overa ll stable  Discussed regarding iron infusions however patient wants to hold off until next visit.  I think is reasonable. Recommend 2 iron pills. [No GI discomfort]  # ETIOLOGY:[facility] AUG 2023- [[facility]]- recommend EGD/ further work up- however patient declines at this time.   # CKD stage III - IV [Dr.Korrapati]- M proetin; NEG; k/l= abnormal sec to CKD- stable. -ESRD [M/W/F];  # Incidental RCC left.  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 Defer to Urology re: ongoing surveillance.  #HTN- stable- - And also follow-up with nephrology closely.   # DISPOSITION: #  NO venofer  # follow up in as needed-   Dr.B   All questions were answered. The patient knows to call the clinic with any problems, questions or concerns.    Cindy JONELLE Joe, MD 09/03/2024 3:22 PM

## 2024-09-03 NOTE — Assessment & Plan Note (Addendum)
#   Chronic anemia-[AUG 2024-Hb 8.8; MCV- 76] microcytic recently getting worse.  Interestingly patient is ONLY mildly symptomatic. 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  # Today hemoglobin is 9. Defre to nephrology re: EPO/IV venofer infusion- follow up with us  as needed-   # ETIOLOGY:[facility] AUG 2023- [[facility]]- recommend EGD/ further work up- however patient declines at this time.   #  -ESRD [M/W/F] [Dr.Singh- see above]   # Incidental RCC left.  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 Defer to Urology re: ongoing surveillance.  #HTN- stable- - And also follow-up with nephrology closely.   # DISPOSITION: #  NO venofer  # follow up in as needed-   Dr.B

## 2024-09-06 ENCOUNTER — Encounter: Payer: Self-pay | Admitting: Internal Medicine

## 2024-10-08 ENCOUNTER — Encounter (INDEPENDENT_AMBULATORY_CARE_PROVIDER_SITE_OTHER): Admitting: Vascular Surgery

## 2024-10-08 ENCOUNTER — Other Ambulatory Visit (INDEPENDENT_AMBULATORY_CARE_PROVIDER_SITE_OTHER)

## 2024-10-08 ENCOUNTER — Encounter (INDEPENDENT_AMBULATORY_CARE_PROVIDER_SITE_OTHER)

## 2024-10-22 ENCOUNTER — Encounter (INDEPENDENT_AMBULATORY_CARE_PROVIDER_SITE_OTHER): Admitting: Vascular Surgery
